# Patient Record
Sex: Female | Born: 1975
Health system: Southern US, Community
[De-identification: ages and names within clinical notes are randomized; demographics above are authoritative.]

## PROBLEM LIST (undated history)

## (undated) DIAGNOSIS — I509 Heart failure, unspecified: Secondary | ICD-10-CM

## (undated) DIAGNOSIS — I10 Essential (primary) hypertension: Secondary | ICD-10-CM

## (undated) DIAGNOSIS — IMO0001 Reserved for inherently not codable concepts without codable children: Secondary | ICD-10-CM

## (undated) HISTORY — DX: Reserved for inherently not codable concepts without codable children: IMO0001

## (undated) HISTORY — DX: Essential (primary) hypertension: I10

---

## 1998-05-29 ENCOUNTER — Emergency Department (HOSPITAL_COMMUNITY): Admission: EM | Admit: 1998-05-29 | Discharge: 1998-05-29 | Payer: Self-pay | Admitting: Emergency Medicine

## 1999-02-08 ENCOUNTER — Other Ambulatory Visit: Admission: RE | Admit: 1999-02-08 | Discharge: 1999-02-08 | Payer: Self-pay | Admitting: Gynecology

## 2004-07-31 ENCOUNTER — Inpatient Hospital Stay (HOSPITAL_COMMUNITY): Admission: AD | Admit: 2004-07-31 | Discharge: 2004-07-31 | Payer: Self-pay | Admitting: Obstetrics & Gynecology

## 2004-10-03 ENCOUNTER — Other Ambulatory Visit: Admission: RE | Admit: 2004-10-03 | Discharge: 2004-10-03 | Payer: Self-pay | Admitting: Gynecology

## 2004-10-26 ENCOUNTER — Encounter: Admission: RE | Admit: 2004-10-26 | Discharge: 2005-01-24 | Payer: Self-pay | Admitting: Gynecology

## 2005-02-25 ENCOUNTER — Inpatient Hospital Stay (HOSPITAL_COMMUNITY): Admission: AD | Admit: 2005-02-25 | Discharge: 2005-02-25 | Payer: Self-pay | Admitting: Gynecology

## 2005-03-07 ENCOUNTER — Inpatient Hospital Stay (HOSPITAL_COMMUNITY): Admission: RE | Admit: 2005-03-07 | Discharge: 2005-03-10 | Payer: Self-pay | Admitting: Gynecology

## 2005-03-07 ENCOUNTER — Encounter (INDEPENDENT_AMBULATORY_CARE_PROVIDER_SITE_OTHER): Payer: Self-pay | Admitting: *Deleted

## 2005-04-18 ENCOUNTER — Other Ambulatory Visit: Admission: RE | Admit: 2005-04-18 | Discharge: 2005-04-18 | Payer: Self-pay | Admitting: Gynecology

## 2005-08-23 ENCOUNTER — Ambulatory Visit: Payer: Self-pay | Admitting: Internal Medicine

## 2005-09-25 ENCOUNTER — Ambulatory Visit: Payer: Self-pay | Admitting: Internal Medicine

## 2005-09-28 ENCOUNTER — Ambulatory Visit: Payer: Self-pay | Admitting: Internal Medicine

## 2005-10-04 ENCOUNTER — Ambulatory Visit: Payer: Self-pay | Admitting: Internal Medicine

## 2007-11-13 ENCOUNTER — Observation Stay (HOSPITAL_COMMUNITY): Admission: EM | Admit: 2007-11-13 | Discharge: 2007-11-15 | Payer: Self-pay | Admitting: Emergency Medicine

## 2007-11-13 ENCOUNTER — Ambulatory Visit: Payer: Self-pay | Admitting: Internal Medicine

## 2007-11-14 ENCOUNTER — Encounter (INDEPENDENT_AMBULATORY_CARE_PROVIDER_SITE_OTHER): Payer: Self-pay | Admitting: Internal Medicine

## 2008-09-14 ENCOUNTER — Inpatient Hospital Stay (HOSPITAL_COMMUNITY): Admission: EM | Admit: 2008-09-14 | Discharge: 2008-09-16 | Payer: Self-pay | Admitting: Emergency Medicine

## 2008-09-14 ENCOUNTER — Ambulatory Visit: Payer: Self-pay | Admitting: Cardiovascular Disease

## 2008-09-15 ENCOUNTER — Encounter (INDEPENDENT_AMBULATORY_CARE_PROVIDER_SITE_OTHER): Payer: Self-pay | Admitting: Internal Medicine

## 2009-02-14 ENCOUNTER — Emergency Department (HOSPITAL_COMMUNITY): Admission: EM | Admit: 2009-02-14 | Discharge: 2009-02-14 | Payer: Self-pay | Admitting: Emergency Medicine

## 2009-04-26 ENCOUNTER — Inpatient Hospital Stay (HOSPITAL_COMMUNITY): Admission: EM | Admit: 2009-04-26 | Discharge: 2009-04-27 | Payer: Self-pay | Admitting: Emergency Medicine

## 2009-04-26 ENCOUNTER — Ambulatory Visit: Payer: Self-pay | Admitting: Internal Medicine

## 2009-04-27 ENCOUNTER — Encounter (INDEPENDENT_AMBULATORY_CARE_PROVIDER_SITE_OTHER): Payer: Self-pay | Admitting: Internal Medicine

## 2009-06-01 ENCOUNTER — Ambulatory Visit: Payer: Self-pay | Admitting: Internal Medicine

## 2009-06-01 DIAGNOSIS — I10 Essential (primary) hypertension: Secondary | ICD-10-CM | POA: Insufficient documentation

## 2009-08-01 ENCOUNTER — Telehealth: Payer: Self-pay | Admitting: Internal Medicine

## 2009-09-22 ENCOUNTER — Ambulatory Visit: Payer: Self-pay | Admitting: Internal Medicine

## 2009-12-26 ENCOUNTER — Telehealth: Payer: Self-pay | Admitting: Internal Medicine

## 2010-09-21 ENCOUNTER — Ambulatory Visit: Payer: Self-pay | Admitting: Gynecology

## 2010-09-21 NOTE — Assessment & Plan Note (Signed)
Summary: F/U FOR BLOOD PRESSURE/CD   Vital Signs:  Patient profile:   35 year old female Menstrual status:  regular Height:      65 inches Weight:      233 pounds O2 Sat:      98 % on Room air Temp:     97.7 degrees F oral Pulse rate:   95 / minute Pulse rhythm:   regular Resp:     16 per minute BP sitting:   154 / 110  (left arm) Cuff size:   large  Vitals Entered By: Rock Nephew CMA (September 22, 2009 2:21 PM)  O2 Flow:  Room air  Primary Care Provider:  Yetta Barre   History of Present Illness: She returns for f/up and says that she was doing well until 2 weeks ago when she ran out of Tribenzor and since then her BP has risen and she has had mild headaches.  Hypertension History:      She complains of headache, but denies chest pain, palpitations, dyspnea with exertion, orthopnea, peripheral edema, visual symptoms, neurologic problems, syncope, and side effects from treatment.  She notes no problems with any antihypertensive medication side effects.        Positive major cardiovascular risk factors include hypertension.  Negative major cardiovascular risk factors include female age less than 27 years old, no history of diabetes or hyperlipidemia, negative family history for ischemic heart disease, and non-tobacco-user status.        Positive history for target organ damage include cardiac end organ damage (either CHF or LVH) and hypertensive retinopathy.  Further assessment for target organ damage reveals no history of ASHD, stroke/TIA, peripheral vascular disease, or renal insufficiency.     Preventive Screening-Counseling & Management  Alcohol-Tobacco     Alcohol drinks/day: <1     Alcohol type: beer     >5/day in last 3 mos: no     Alcohol Counseling: not indicated; use of alcohol is not excessive or problematic     Feels need to cut down: no     Feels annoyed by complaints: no     Feels guilty re: drinking: no     Needs 'eye opener' in am: no     Smoking Status:  never  Hep-HIV-STD-Contraception     Hepatitis Risk: no risk noted     HIV Risk: no risk noted     STD Risk: no risk noted      Sexual History:  currently monogamous.        Drug Use:  no.        Blood Transfusions:  no.    Medications Prior to Update: 1)  Tribenzor 20-5-12.5 Mg Tabs (Olmesartan-Amlodipine-Hctz) .... Once Daily For High Blood Pressure 2)  Mirena 20 Mcg/24hr Iud (Levonorgestrel)  Current Medications (verified): 1)  Tribenzor 20-5-12.5 Mg Tabs (Olmesartan-Amlodipine-Hctz) .... Once Daily For High Blood Pressure 2)  Mirena 20 Mcg/24hr Iud (Levonorgestrel)  Allergies (verified): No Known Drug Allergies  Past History:  Past Medical History: Reviewed history from 06/01/2009 and no changes required. Hypertension Gestational diabetes  Past Surgical History: Reviewed history from 06/01/2009 and no changes required. Caesarean section  Family History: Reviewed history from 06/01/2009 and no changes required. Family History Diabetes 1st degree relative Family History High cholesterol Family History Hypertension Family History of Stroke F 1st degree relative <60  Social History: Reviewed history from 06/01/2009 and no changes required. 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    Physical Exam  General:  alert, well-developed, well-nourished, well-hydrated, appropriate dress, normal appearance, healthy-appearing, cooperative to examination, and overweight-appearing.   Eyes:  vision grossly intact, pupils equal, pupils round, pupils reactive to light, and a-v nicking.   Mouth:  Oral mucosa and oropharynx without lesions or exudates.  Teeth in good repair. Neck:  supple, full ROM, no masses, no thyromegaly, no cervical lymphadenopathy, and no neck tenderness.   Lungs:  Normal  respiratory effort, chest expands symmetrically. Lungs are clear to auscultation, no crackles or wheezes. Heart:  Normal rate and regular rhythm. S1 and S2 normal without gallop, murmur, click, rub or other extra sounds. Abdomen:  soft, non-tender, normal bowel sounds, no distention, no masses, no guarding, no rigidity, no rebound tenderness, no hepatomegaly, and no splenomegaly.   Msk:  normal ROM, no joint tenderness, no joint swelling, no joint warmth, and no redness over joints.   Pulses:  R and L carotid,radial,femoral,dorsalis pedis and posterior tibial pulses are full and equal bilaterally Extremities:  No clubbing, cyanosis, edema, or deformity noted with normal full range of motion of all joints.   Neurologic:  No cranial nerve deficits noted. Station and gait are normal. Plantar reflexes are down-going bilaterally. DTRs are symmetrical throughout. Sensory, motor and coordinative functions appear intact. Skin:  Intact without suspicious lesions or rashes Cervical Nodes:  No lymphadenopathy noted Psych:  Cognition and judgment appear intact. Alert and cooperative with normal attention span and concentration. No apparent delusions, illusions, hallucinations   Impression & Recommendations:  Problem # 1:  HYPERTENSION (ICD-401.9) Assessment Deteriorated  Her updated medication list for this problem includes:    Tribenzor 20-5-12.5 Mg Tabs (Olmesartan-amlodipine-hctz) ..... Once daily for high blood pressure  BP today: 154/110 Prior BP: 162/100 (06/01/2009)  Prior 10 Yr Risk Heart Disease: Not enough information (06/01/2009)  Complete Medication List: 1)  Tribenzor 20-5-12.5 Mg Tabs (Olmesartan-amlodipine-hctz) .... Once daily for high blood pressure 2)  Mirena 20 Mcg/24hr Iud (Levonorgestrel)  Hypertension Assessment/Plan:      The patient's hypertensive risk group is category C: Target organ damage and/or diabetes.  Today's blood pressure is 154/110.  Her blood pressure goal is <  140/90.  Patient Instructions: 1)  Please schedule a follow-up appointment in 6 months. 2)  It is important that you exercise regularly at least 20 minutes 5 times a week. If you develop chest pain, have severe difficulty breathing, or feel very tired , stop exercising immediately and seek medical attention. 3)  You need to lose weight. Consider a lower calorie diet and regular exercise.  4)  Check your Blood Pressure regularly. If it is above 140/90: you should make an appointment. Prescriptions: TRIBENZOR 20-5-12.5 MG TABS (OLMESARTAN-AMLODIPINE-HCTZ) once daily for high blood pressure  #30 x 11   Entered and Authorized by:   Etta Grandchild MD   Signed by:   Etta Grandchild MD on 09/22/2009   Method used:   Print then Give to Patient   RxID:   [id number] TRIBENZOR 20-5-12.5 MG TABS (OLMESARTAN-AMLODIPINE-HCTZ) once daily for high blood pressure  #56 x 0   Entered and Authorized by:   Etta Grandchild MD   Signed by:   Etta Grandchild MD on 09/22/2009   Method used:   Samples Given   RxID:   [id number]

## 2010-09-21 NOTE — Progress Notes (Signed)
Summary: side effects  Phone Note Call from Patient Call back at Home Phone [phone]   Summary of Call: Patient left message on triage that prescription for Tribenzor on 4/22. Patient c/o weakness, left arm pain, and rattle when breating. Is this side effects from the med? Any recommendations or should she come in? Please advise. Initial call taken by: Lucious Groves,  Dec 26, 2009 1:17 PM  Follow-up for Phone Call        no, it doesn't sound like a side effect Follow-up by: Etta Grandchild MD,  Dec 26, 2009 1:27 PM  Additional Follow-up for Phone Call Additional follow up Details #1::        notified pt with md response. Pt states she feel alot better since she started moving around. If sxs start back will make f/u appt for eval Additional Follow-up by: Orlan Leavens,  Dec 26, 2009 2:19 PM

## 2010-10-03 ENCOUNTER — Other Ambulatory Visit: Payer: Self-pay | Admitting: Gynecology

## 2010-10-03 ENCOUNTER — Other Ambulatory Visit: Payer: Self-pay | Admitting: Internal Medicine

## 2010-10-03 ENCOUNTER — Encounter: Payer: Self-pay | Admitting: Internal Medicine

## 2010-10-03 ENCOUNTER — Ambulatory Visit (INDEPENDENT_AMBULATORY_CARE_PROVIDER_SITE_OTHER): Payer: 59 | Admitting: Gynecology

## 2010-10-03 ENCOUNTER — Ambulatory Visit (INDEPENDENT_AMBULATORY_CARE_PROVIDER_SITE_OTHER): Payer: 59 | Admitting: Internal Medicine

## 2010-10-03 ENCOUNTER — Other Ambulatory Visit: Payer: 59

## 2010-10-03 ENCOUNTER — Other Ambulatory Visit (HOSPITAL_COMMUNITY)
Admission: RE | Admit: 2010-10-03 | Discharge: 2010-10-03 | Disposition: A | Discharge status: Home / Self Care | Payer: 59 | Source: Ambulatory Visit | Attending: Gynecology | Admitting: Gynecology

## 2010-10-03 DIAGNOSIS — R82998 Other abnormal findings in urine: Secondary | ICD-10-CM

## 2010-10-03 DIAGNOSIS — Z01419 Encounter for gynecological examination (general) (routine) without abnormal findings: Secondary | ICD-10-CM

## 2010-10-03 DIAGNOSIS — I10 Essential (primary) hypertension: Secondary | ICD-10-CM

## 2010-10-03 DIAGNOSIS — Z124 Encounter for screening for malignant neoplasm of cervix: Secondary | ICD-10-CM | POA: Insufficient documentation

## 2010-10-03 LAB — BASIC METABOLIC PANEL
BUN: 13 mg/dL (ref 6–23)
CO2: 29 mEq/L (ref 19–32)
Calcium: 9.2 mg/dL (ref 8.4–10.5)
Chloride: 102 mEq/L (ref 96–112)
Creatinine, Ser: 0.9 mg/dL (ref 0.4–1.2)
Glucose, Bld: 91 mg/dL (ref 70–99)
Potassium: 3.8 mEq/L (ref 3.5–5.1)
Sodium: 136 mEq/L (ref 135–145)

## 2010-10-11 NOTE — Assessment & Plan Note (Signed)
Summary: FU ON BP AND MEDS/NWS  #   Vital Signs:  Patient profile:   35 year old female Menstrual status:  regular LMP:     09/20/2010 Height:      65 inches Weight:      230 pounds BMI:     38.41 O2 Sat:      97 % on Room air Temp:     98.3 degrees F oral Pulse rate:   90 / minute Pulse rhythm:   regular Resp:     16 per minute BP sitting:   140 / 86  (left arm) Cuff size:   large  Vitals Entered By: Rock Nephew CMA (October 03, 2010 8:57 AM)  Nutrition Counseling: Patient's BMI is greater than 25 and therefore counseled on weight management options.  O2 Flow:  Room air CC: follow-up visit Is Patient Diabetic? No Pain Assessment Patient in pain? no      LMP (date): 09/20/2010     Enter LMP: 09/20/2010   Primary Care Provider:  Yetta Barre  CC:  follow-up visit.  History of Present Illness:  Hypertension Follow-Up      This is a 35 year old woman who presents for Hypertension follow-up.  The patient denies lightheadedness, urinary frequency, headaches, edema, rash, and fatigue.  The patient denies the following associated symptoms: chest pain, chest pressure, exercise intolerance, dyspnea, palpitations, syncope, leg edema, and pedal edema.  Compliance with medications (by patient report) has been near 100%.  The patient reports that dietary compliance has been fair.  The patient reports no exercise.  Adjunctive measures currently used by the patient include salt restriction and relaxation.    Preventive Screening-Counseling & Management  Alcohol-Tobacco     Alcohol drinks/day: <1     Alcohol type: beer     >5/day in last 3 mos: no     Alcohol Counseling: not indicated; use of alcohol is not excessive or problematic     Feels need to cut down: no     Feels annoyed by complaints: no     Feels guilty re: drinking: no     Needs 'eye opener' in am: no     Smoking Status: never     Tobacco Counseling: not indicated; no tobacco use  Hep-HIV-STD-Contraception  Hepatitis Risk: no risk noted     HIV Risk: no risk noted     STD Risk: no risk noted      Sexual History:  currently monogamous.        Drug Use:  no.        Blood Transfusions:  no.    Medications Prior to Update: 1)  Tribenzor 20-5-12.5 Mg Tabs (Olmesartan-Amlodipine-Hctz) .... Once Daily For High Blood Pressure 2)  Mirena 20 Mcg/24hr Iud (Levonorgestrel)  Current Medications (verified): 1)  Tribenzor 20-5-12.5 Mg Tabs (Olmesartan-Amlodipine-Hctz) .... 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 Family History Diabetes 1st degree relative Family History High cholesterol Family History Hypertension Family History of Stroke F 1st degree relative <60  Social History: Reviewed history from 06/01/2009 and no changes required. 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  The patient denies anorexia, fever, weight loss, chest pain, syncope, dyspnea on exertion, peripheral edema, prolonged cough,  headaches, hemoptysis, abdominal pain, hematuria, suspicious skin lesions, difficulty walking, depression, unusual weight change, abnormal bleeding, and enlarged lymph nodes.    Physical Exam  General:  alert, well-developed, well-nourished, well-hydrated, appropriate dress, normal appearance, healthy-appearing, cooperative to examination, and overweight-appearing.   Head:  normocephalic and atraumatic.   Mouth:  Oral mucosa and oropharynx without lesions or exudates.  Teeth in good repair. Neck:  supple, full ROM, no masses, no thyromegaly, no cervical lymphadenopathy, and no neck tenderness.   Lungs:  Normal respiratory effort, chest expands symmetrically. Lungs are clear to auscultation, no crackles or wheezes. Heart:  Normal rate and regular rhythm. S1 and S2 normal without gallop, murmur, click, rub or other extra sounds. Abdomen:  soft, non-tender, normal bowel sounds, no distention, no masses, no guarding, no rigidity, no rebound tenderness, no hepatomegaly, and no splenomegaly.   Msk:  normal ROM, no joint tenderness, no joint swelling, no joint warmth, and no redness over joints.   Pulses:  R and L carotid,radial,femoral,dorsalis pedis and posterior tibial pulses are full and equal bilaterally Extremities:  No clubbing, cyanosis, edema, or deformity noted with normal full range of motion of all joints.   Neurologic:  No cranial nerve deficits noted. Station and gait are normal. Plantar reflexes are down-going bilaterally. DTRs are symmetrical throughout. Sensory, motor and coordinative functions appear intact. Skin:  Intact without suspicious lesions or rashes Cervical Nodes:  No lymphadenopathy noted Psych:  Cognition and judgment appear intact. Alert and cooperative with normal attention span and concentration. No apparent delusions, illusions, hallucinations   Impression & Recommendations:  Problem # 1:  HYPERTENSION (ICD-401.9) Assessment Improved  Her updated medication list  for this problem includes:    Tribenzor 20-5-12.5 Mg Tabs (Olmesartan-amlodipine-hctz) ..... Once daily for high blood pressure  Orders: Venipuncture (16109) TLB-BMP (Basic Metabolic Panel-BMET) (80048-METABOL)  BP today: 140/86 Prior BP: 154/110 (09/22/2009)  Prior 10 Yr Risk Heart Disease: Not enough information (06/01/2009)  Complete Medication List: 1)  Tribenzor 20-5-12.5 Mg Tabs (Olmesartan-amlodipine-hctz) .... Once daily for high blood pressure 2)  Mirena 20 Mcg/24hr Iud (Levonorgestrel)  Patient Instructions: 1)  Please schedule a follow-up appointment in 4 months. 2)  It is important that you exercise regularly at least 20 minutes 5 times a week. If you develop chest pain, have severe difficulty breathing, or feel very tired , stop exercising immediately and seek medical attention. 3)  You need to lose weight. Consider a lower calorie diet and regular exercise.  4)  If you could be exposed to sexually transmitted diseases, you should use a condom. 5)  If you are having sex and you or your partner don't want a child, use contraception. 6)  Check your Blood Pressure regularly. If it is above 140/90: you should make an appointment. Prescriptions: TRIBENZOR 20-5-12.5 MG TABS (OLMESARTAN-AMLODIPINE-HCTZ) once daily for high blood pressure  #30 x 11   Entered and Authorized by:   Etta Grandchild MD   Signed by:  Etta Grandchild MD on 10/03/2010   Method used:   Electronically to        [facility] (retail)       [address]       [place], [place]  [address]       Ph: [phone]       Fax: [phone]   RxID:   [id number]    Orders Added: 1)  Venipuncture [40102] 2)  TLB-BMP (Basic Metabolic Panel-BMET) [80048-METABOL] 3)  Est. Patient Level III [72536]

## 2010-11-24 LAB — POCT I-STAT, CHEM 8
BUN: 12 mg/dL (ref 6–23)
BUN: 22 mg/dL (ref 6–23)
Calcium, Ion: 1.04 mmol/L — ABNORMAL LOW (ref 1.12–1.32)
Calcium, Ion: 1.12 mmol/L (ref 1.12–1.32)
Chloride: 102 mEq/L (ref 96–112)
Chloride: 103 mEq/L (ref 96–112)
Creatinine, Ser: 1 mg/dL (ref 0.4–1.2)
Creatinine, Ser: 1.2 mg/dL (ref 0.4–1.2)
Glucose, Bld: 115 mg/dL — ABNORMAL HIGH (ref 70–99)
Glucose, Bld: 129 mg/dL — ABNORMAL HIGH (ref 70–99)
HCT: 42 % (ref 36.0–46.0)
HCT: 43 % (ref 36.0–46.0)
Hemoglobin: 14.3 g/dL (ref 12.0–15.0)
Hemoglobin: 14.6 g/dL (ref 12.0–15.0)
Potassium: 3.7 mEq/L (ref 3.5–5.1)
Potassium: 5.7 mEq/L — ABNORMAL HIGH (ref 3.5–5.1)
Sodium: 135 mEq/L (ref 135–145)
Sodium: 136 mEq/L (ref 135–145)
TCO2: 22 mmol/L (ref 0–100)
TCO2: 26 mmol/L (ref 0–100)

## 2010-11-24 LAB — BASIC METABOLIC PANEL
BUN: 10 mg/dL (ref 6–23)
BUN: 12 mg/dL (ref 6–23)
CO2: 23 mEq/L (ref 19–32)
Calcium: 8.8 mg/dL (ref 8.4–10.5)
Chloride: 106 mEq/L (ref 96–112)
Creatinine, Ser: 0.86 mg/dL (ref 0.4–1.2)
Creatinine, Ser: 1.11 mg/dL (ref 0.4–1.2)
GFR calc Af Amer: >60 mL/min (ref >60)
GFR calc non Af Amer: 56 mL/min — ABNORMAL LOW (ref >60)
GFR calc non Af Amer: >60 mL/min (ref >60)
Glucose, Bld: 111 mg/dL — ABNORMAL HIGH (ref 70–99)
Glucose, Bld: 124 mg/dL — ABNORMAL HIGH (ref 70–99)
Potassium: 3.5 mEq/L (ref 3.5–5.1)
Potassium: 3.7 mEq/L (ref 3.5–5.1)
Sodium: 136 mEq/L (ref 135–145)

## 2010-11-24 LAB — CBC
HCT: 41.1 % (ref 36.0–46.0)
Hemoglobin: 13.6 g/dL (ref 12.0–15.0)
Hemoglobin: 13.9 g/dL (ref 12.0–15.0)
MCHC: 33.1 g/dL (ref 30.0–36.0)
MCHC: 33.3 g/dL (ref 30.0–36.0)
MCV: 78.3 fL (ref 78.0–100.0)
Platelets: 219 10*3/uL (ref 150–400)
RBC: 5.24 MIL/uL — ABNORMAL HIGH (ref 3.87–5.11)
RBC: 5.34 MIL/uL — ABNORMAL HIGH (ref 3.87–5.11)
RDW: 16 % — ABNORMAL HIGH (ref 11.5–15.5)
WBC: 8.8 10*3/uL (ref 4.0–10.5)
WBC: 9.9 10*3/uL (ref 4.0–10.5)

## 2010-11-24 LAB — LIPID PANEL
HDL: 53 mg/dL (ref >39)
LDL Cholesterol: 129 mg/dL — ABNORMAL HIGH (ref 0–99)
Total CHOL/HDL Ratio: 3.7 RATIO
Triglycerides: 66 mg/dL (ref <150)
VLDL: 13 mg/dL (ref 0–40)

## 2010-11-24 LAB — CARDIAC PANEL(CRET KIN+CKTOT+MB+TROPI)
CK, MB: 7 ng/mL — ABNORMAL HIGH (ref 0.3–4.0)
Relative Index: 2.3 (ref 0.0–2.5)
Total CK: 300 U/L — ABNORMAL HIGH (ref 7–177)
Troponin I: 0.04 ng/mL (ref 0.00–0.06)

## 2010-11-24 LAB — HEPATIC FUNCTION PANEL
ALT: 30 U/L (ref 0–35)
AST: 38 U/L — ABNORMAL HIGH (ref 0–37)
Albumin: 3.4 g/dL — ABNORMAL LOW (ref 3.5–5.2)
Alkaline Phosphatase: 75 U/L (ref 39–117)
Bilirubin, Direct: 0.2 mg/dL (ref 0.0–0.3)
Indirect Bilirubin: 0.6 mg/dL (ref 0.3–0.9)
Total Bilirubin: 0.8 mg/dL (ref 0.3–1.2)
Total Protein: 7 g/dL (ref 6.0–8.3)

## 2010-11-24 LAB — DIFFERENTIAL
Basophils Absolute: 0 10*3/uL (ref 0.0–0.1)
Basophils Relative: 0 % (ref 0–1)
Eosinophils Absolute: 0.9 10*3/uL — ABNORMAL HIGH (ref 0.0–0.7)
Eosinophils Relative: 9 % — ABNORMAL HIGH (ref 0–5)
Lymphocytes Relative: 31 % (ref 12–46)
Lymphs Abs: 3.1 10*3/uL (ref 0.7–4.0)
Monocytes Absolute: 0.4 10*3/uL (ref 0.1–1.0)
Monocytes Relative: 4 % (ref 3–12)
Neutro Abs: 5.4 10*3/uL (ref 1.7–7.7)
Neutrophils Relative %: 55 % (ref 43–77)

## 2010-11-24 LAB — CK TOTAL AND CKMB (NOT AT ARMC)
CK, MB: 9.6 ng/mL — ABNORMAL HIGH (ref 0.3–4.0)
Relative Index: 2.2 (ref 0.0–2.5)
Total CK: 443 U/L — ABNORMAL HIGH (ref 7–177)

## 2010-11-24 LAB — URINE DRUGS OF ABUSE SCREEN W ALC, ROUTINE (REF LAB)
Amphetamine Screen, Ur: NEGATIVE
Barbiturate Quant, Ur: NEGATIVE
Benzodiazepines.: NEGATIVE
Cocaine Metabolites: NEGATIVE
Creatinine,U: 51.9 mg/dL
Ethyl Alcohol: 32 mg/dL — ABNORMAL HIGH (ref <10)
Marijuana Metabolite: NEGATIVE
Methadone: NEGATIVE
Opiate Screen, Urine: NEGATIVE
Phencyclidine (PCP): NEGATIVE
Propoxyphene: NEGATIVE

## 2010-11-24 LAB — BRAIN NATRIURETIC PEPTIDE: Pro B Natriuretic peptide (BNP): 223 pg/mL — ABNORMAL HIGH (ref 0.0–100.0)

## 2010-11-24 LAB — POCT CARDIAC MARKERS
CKMB, poc: 8.1 ng/mL (ref 1.0–8.0)
Myoglobin, poc: 115 ng/mL (ref 12–200)
Troponin i, poc: <0.05 ng/mL (ref 0.00–0.09)

## 2010-11-24 LAB — POTASSIUM: Potassium: 3.7 mEq/L (ref 3.5–5.1)

## 2010-11-24 LAB — T4, FREE: Free T4: 0.93 ng/dL (ref 0.80–1.80)

## 2010-11-24 LAB — D-DIMER, QUANTITATIVE: D-Dimer, Quant: 0.75 ug/mL-FEU — ABNORMAL HIGH (ref 0.00–0.48)

## 2010-11-24 LAB — ETHANOL CONFIRM, URINE

## 2010-11-24 LAB — TROPONIN I: Troponin I: 0.02 ng/mL (ref 0.00–0.06)

## 2010-11-27 LAB — DIFFERENTIAL
Basophils Absolute: 0.1 10*3/uL (ref 0.0–0.1)
Basophils Relative: 1 % (ref 0–1)
Eosinophils Absolute: 0.4 10*3/uL (ref 0.0–0.7)
Eosinophils Relative: 5 % (ref 0–5)
Lymphocytes Relative: 36 % (ref 12–46)
Lymphs Abs: 3.1 10*3/uL (ref 0.7–4.0)
Monocytes Absolute: 0.7 10*3/uL (ref 0.1–1.0)
Monocytes Relative: 8 % (ref 3–12)
Neutro Abs: 4.5 10*3/uL (ref 1.7–7.7)
Neutrophils Relative %: 51 % (ref 43–77)

## 2010-11-27 LAB — URINE MICROSCOPIC-ADD ON

## 2010-11-27 LAB — CBC
HCT: 41.7 % (ref 36.0–46.0)
Hemoglobin: 14.1 g/dL (ref 12.0–15.0)
MCHC: 33.8 g/dL (ref 30.0–36.0)
MCV: 77.3 fL — ABNORMAL LOW (ref 78.0–100.0)
Platelets: 189 10*3/uL (ref 150–400)
RBC: 5.4 MIL/uL — ABNORMAL HIGH (ref 3.87–5.11)
RDW: 16.2 % — ABNORMAL HIGH (ref 11.5–15.5)
WBC: 8.8 10*3/uL (ref 4.0–10.5)

## 2010-11-27 LAB — BASIC METABOLIC PANEL
BUN: 13 mg/dL (ref 6–23)
CO2: 23 mEq/L (ref 19–32)
Calcium: 9.2 mg/dL (ref 8.4–10.5)
Chloride: 107 mEq/L (ref 96–112)
Creatinine, Ser: 0.96 mg/dL (ref 0.4–1.2)
GFR calc Af Amer: >60 mL/min (ref >60)
GFR calc non Af Amer: >60 mL/min (ref >60)
Glucose, Bld: 139 mg/dL — ABNORMAL HIGH (ref 70–99)
Potassium: 4 mEq/L (ref 3.5–5.1)
Sodium: 140 mEq/L (ref 135–145)

## 2010-11-27 LAB — URINALYSIS, ROUTINE W REFLEX MICROSCOPIC
Bilirubin Urine: NEGATIVE
Hgb urine dipstick: NEGATIVE
Ketones, ur: NEGATIVE mg/dL
Nitrite: NEGATIVE
Protein, ur: 30 mg/dL — AB
Specific Gravity, Urine: 1.028 (ref 1.005–1.030)
Urobilinogen, UA: 0.2 mg/dL (ref 0.0–1.0)

## 2010-12-04 LAB — TROPONIN I: Troponin I: 0.02 ng/mL (ref 0.00–0.06)

## 2010-12-04 LAB — CARDIAC PANEL(CRET KIN+CKTOT+MB+TROPI)
CK, MB: 3.1 ng/mL (ref 0.3–4.0)
Relative Index: 1.6 (ref 0.0–2.5)
Total CK: 192 U/L — ABNORMAL HIGH (ref 7–177)
Troponin I: 0.02 ng/mL (ref 0.00–0.06)

## 2010-12-04 LAB — PROTIME-INR: Prothrombin Time: 14.2 seconds (ref 11.6–15.2)

## 2010-12-04 LAB — CK TOTAL AND CKMB (NOT AT ARMC)
CK, MB: 4.8 ng/mL — ABNORMAL HIGH (ref 0.3–4.0)
Relative Index: 1.7 (ref 0.0–2.5)
Total CK: 289 U/L — ABNORMAL HIGH (ref 7–177)

## 2010-12-04 LAB — DIFFERENTIAL
Eosinophils Absolute: 0.7 10*3/uL (ref 0.0–0.7)
Eosinophils Relative: 7 % — ABNORMAL HIGH (ref 0–5)
Lymphocytes Relative: 42 % (ref 12–46)
Lymphs Abs: 4.5 10*3/uL — ABNORMAL HIGH (ref 0.7–4.0)
Monocytes Absolute: 0.6 10*3/uL (ref 0.1–1.0)

## 2010-12-04 LAB — BASIC METABOLIC PANEL
Calcium: 9.1 mg/dL (ref 8.4–10.5)
Chloride: 106 mEq/L (ref 96–112)
Creatinine, Ser: 1.05 mg/dL (ref 0.4–1.2)
GFR calc Af Amer: >60 mL/min (ref >60)
GFR calc non Af Amer: 60 mL/min — ABNORMAL LOW (ref >60)

## 2010-12-04 LAB — HEMOGLOBIN A1C
Hgb A1c MFr Bld: 6.4 % — ABNORMAL HIGH (ref 4.6–6.1)
Mean Plasma Glucose: 137 mg/dL

## 2010-12-04 LAB — CBC
HCT: 42 % (ref 36.0–46.0)
HCT: 42.3 % (ref 36.0–46.0)
Hemoglobin: 13.4 g/dL (ref 12.0–15.0)
Hemoglobin: 13.9 g/dL (ref 12.0–15.0)
MCHC: 32 g/dL (ref 30.0–36.0)
MCV: 79 fL (ref 78.0–100.0)
MCV: 79.4 fL (ref 78.0–100.0)
Platelets: 243 10*3/uL (ref 150–400)
RDW: 14.8 % (ref 11.5–15.5)
RDW: 15.4 % (ref 11.5–15.5)
WBC: 10.8 10*3/uL — ABNORMAL HIGH (ref 4.0–10.5)

## 2010-12-04 LAB — POCT I-STAT, CHEM 8
BUN: 16 mg/dL (ref 6–23)
Calcium, Ion: 1.13 mmol/L (ref 1.12–1.32)
Creatinine, Ser: 1 mg/dL (ref 0.4–1.2)
Hemoglobin: 15 g/dL (ref 12.0–15.0)
TCO2: 25 mmol/L (ref 0–100)

## 2010-12-04 LAB — D-DIMER, QUANTITATIVE: D-Dimer, Quant: 0.43 ug/mL-FEU (ref 0.00–0.48)

## 2010-12-04 LAB — COMPREHENSIVE METABOLIC PANEL
Alkaline Phosphatase: 61 U/L (ref 39–117)
BUN: 12 mg/dL (ref 6–23)
Glucose, Bld: 116 mg/dL — ABNORMAL HIGH (ref 70–99)
Potassium: 3.7 mEq/L (ref 3.5–5.1)
Total Protein: 7 g/dL (ref 6.0–8.3)

## 2010-12-04 LAB — BRAIN NATRIURETIC PEPTIDE: Pro B Natriuretic peptide (BNP): 213 pg/mL — ABNORMAL HIGH (ref 0.0–100.0)

## 2010-12-04 LAB — RAPID URINE DRUG SCREEN, HOSP PERFORMED
Barbiturates: NOT DETECTED
Cocaine: NOT DETECTED
Opiates: NOT DETECTED

## 2010-12-04 LAB — TSH: TSH: 0.897 u[IU]/mL (ref 0.350–4.500)

## 2010-12-04 LAB — LIPID PANEL
Cholesterol: 218 mg/dL — ABNORMAL HIGH (ref 0–200)
HDL: 49 mg/dL (ref >39)

## 2010-12-04 LAB — POCT PREGNANCY, URINE: Preg Test, Ur: NEGATIVE

## 2010-12-04 LAB — ALDOSTERONE + RENIN ACTIVITY W/ RATIO

## 2011-01-02 NOTE — Discharge Summary (Signed)
[name], [name]               ACCOUNT NO.:  [id number]   MEDICAL RECORD NO.:  [id number]          PATIENT TYPE:  INP   LOCATION:  4729                         FACILITY:  [facility]   PHYSICIAN:  Richarda Overlie, MD       DATE OF BIRTH:  07-20-1976   DATE OF ADMISSION:  09/14/2008  DATE OF DISCHARGE:  09/16/2008                               DISCHARGE SUMMARY   DISCHARGE DIAGNOSES:  1. Hypertensive urgency.  2. Hypokalemia.  3. Likely metabolic syndrome with borderline diabetes.  4. Dyslipidemia.  5. Mild systolic congestive heart failure.   SUBJECTIVE:  This is a 35 year old female with a past medical history  for hypertension, hypertensive urgency in March 2009, gestational  diabetes and noncompliance who presented to the ER with a chief  complaint of shortness of breath.  The patient denied any chest pain or  chest pressure or bilateral swelling in her legs, denied any fever,  chills, rigors or nausea.  The patient has had ongoing shortness of  breath for the last 2 weeks associated with a non-productive cough.  On  presentation to the ER, she found to have sinus tachycardia on her  electrocardiogram, negative cardiac markers, mildly elevated BNP of 158,  D-dimer was within normal limits.  Chest x-ray revealed mild  interstitial pulmonary edema.  The patient was also found to be quite  hypertensive with an initial blood pressure of 204/131.   HOSPITAL COURSE:  1. Malignant hypertension/hypertensive urgency.  The patient was      admitted for controlling her blood pressure and was initiated on      antihypertensive medications with clonidine, IV hydralazine and IV      Lasix and metoprolol p.o.  She was also initiated on lisinopril      which increased her creatinine from 1 to 1.25.  The patient's blood      pressure stabilized with the above regimen.  The patient was also      found to be hypokalemic, with an initial potassium of 3.3.  Causes      of secondary hypertension were  considered and renal artery stenosis      and adrenal adenoma were also considered.  The patient was      evaluated by a CT angio of the abdomen and pelvis, did not show any      evidence of renal artery stenosis.  However, the left adrenal gland      looked slightly enlarged and a small adenoma about 1 cm in size      could not be ruled out at this time.  The patient would probably      benefit from repeat imaging in about a year.  In the meantime, the      patient should continue with all her antihypertensive medications      as outlined below and has been strongly advised to loose weight and      establish a PCP.   1. Interstitial pulmonary edema.  The patient responded well to      diuresis with IV Lasix.  She had a cardiac echo  done that showed an      ejection fraction of 45-50% with mild diffuse left ventricular      hypokinesis.  The patient had a mild decrease in her systolic      function and is being appropriately started on metoprolol, Lasix.      The patient has been chest pain-free, ruled out for acute coronary      syndrome and thyroid function was found to be within normal limits.      At this time, the patient is asymptomatic from a congestive heart      failure standpoint.  She would probably benefit from a repeat      cardiac echo in 1 year.  If she does develop a recurrence of her      pulmonary symptoms, especially if she continues to be compliant      with her medications following discharge, then the patient would      probably need a cardiology consultation at some point.  However, at      this time I feel that all of her symptoms are probably related to      metabolic syndrome, given the fact that she has evidence of      borderline diabetes with a hemoglobin A1c of 6.4, is hypertensive      and also has an elevated LDL cholesterol and a low HDL on her lipid      panel.  The patient is strongly advised to lose weight, stay      compliant with her medications,  establish a primary care Janne Faulk      within the next week and have a repeat fasting glucose and BMET in      5-7 days.   FOLLOW-UP CONCERNS:  The patient to establish a primary care Aiyla Baucom in  5-7 days, follow-up glucose and BMET in 5-7 days, advised about weight  loss, up to 10% of her body weight recommended.  Repeat cardiac echo in  1 year.   DISCHARGE MEDICATIONS:  1. Hydralazine 50 mg p.o. q.8 h.  2. Toprol XL 100 mg p.o. daily.  3. K-Dur 40 mEq p.o. daily.  4. Lasix 40 kg p.o. daily.      Richarda Overlie, MD  Electronically Signed     NA/MEDQ  D:  09/16/2008  T:  09/16/2008  Job:  [id number]

## 2011-01-02 NOTE — Discharge Summary (Signed)
NAMEMILLEY, VINING               ACCOUNT NO.:  [id number]   MEDICAL RECORD NO.:  [id number]          PATIENT TYPE:  INP   LOCATION:  2030                         FACILITY:  [facility]   PHYSICIAN:  Madaline Savage, MD        DATE OF BIRTH:  [birth year]   DATE OF ADMISSION:  11/13/2007  DATE OF DISCHARGE:  11/15/2007                               DISCHARGE SUMMARY   PRIMARY CARE PHYSICIAN:  None.  This patient is unassigned to [place].   DISCHARGE DIAGNOSES:  1. Hypertensive emergency.  2. Chest pain, ruled out acute coronary syndrome.  3. Tachycardia, which is resolved.   DISCHARGE MEDICATIONS:  1. Lopressor 50 mg twice daily.  2. Lisinopril 10 mg once daily.  3. Penicillin as prescribed by her dentist.   HISTORY OF PRESENT ILLNESS:  For full history and physical, see history  and physical dictated by Dr. Jerolyn Center on November 13, 2007.   In short, this is a 35 year old African American female who was seen by  her dentist today and was found to have elevated blood pressure.  In the  emergency room, she had a blood pressure of 225/152, and was admitted  for further evaluation and management.   PROBLEM:  Hypertensive urgency.  Ms. Hajduk came in with blood pressure  of 225/152 and had a heart rate of 116 and she was started on the  Cardizem drip and p.o. metoprolol and lisinopril.  Blood pressure came  down and the Cardizem drip was stopped, and she was monitored on her  p.o. medications.  Her blood pressure at the time of discharge is  140/70, and 2D echocardiogram was done to evaluate to her left  ventricular function.  At this time, her echocardiogram was also still  pending.   She will be now discharged home in stable condition.   FOLLOWUP:  She is asked to follow up with doctor at [facility] in 1-2  weeks.      Madaline Savage, MD  Electronically Signed     PKN/MEDQ  D:  11/15/2007  T:  11/16/2007  Job:  [id number]

## 2011-01-02 NOTE — H&P (Signed)
NAMEDALE, STRAUSSER NO.:  [id number]   MEDICAL RECORD NO.:  [id number]          PATIENT TYPE:  EMS   LOCATION:  MAJO                         FACILITY:  [facility]   PHYSICIAN:  Altha Harm, MDDATE OF BIRTH:  [birth year]   DATE OF ADMISSION:  11/13/2007  DATE OF DISCHARGE:                              HISTORY & PHYSICAL   CHIEF COMPLAINT:  Elevated blood pressure.   HISTORY OF PRESENT ILLNESS:  This is a 35 year old African American  female who was seen in her dentist's office today and was found to have  an elevated blood pressure.  The patient states that she has a history  of hypertension during pregnancy approximately 2.5 years ago, however,  has not had a subsequent diagnosis of hypertension.  The patient also  relates chest pain which started approximately 24 hours ago which is  substernal, non-radiating, lasting approximately 1 hour, accompanied by  some shortness of breath.  She had no diaphoresis.  The patient denies  any orthopnea, PND, or pedal edema.  She states that the pain at its  highest was about a 6 out of 10, however, the pain is subsided at this  time.  The patient has had no fever, chills, no nausea, vomiting or  diarrhea.  She has no history of elevated lipids.  She has no history of  hypertension except for gestational hypertension and the patient does  have a history of gestational diabetes.   PAST MEDICAL HISTORY:  As noted above is significant for:  1. Gestational diabetes.  2. Pre-eclampsia with hypertension.  As far as she knows there was no proteinuria.   FAMILY HISTORY:  Significant for diabetes in her mother and coronary  artery disease in her uncle.   SOCIAL HISTORY:  The patient works as a Transport planner at [employer].  She has 3 children and is married and lives with her  husband.  She has no tobacco or drug use.  She drinks alcohol  approximately 1-2 times per week socially.   CURRENT MEDICATIONS:   The patient was prescribed penicillin today by her  dentist to be taken for 7 days.   ALLERGIES:  No known drug allergies.   PRIMARY CARE PHYSICIAN:  The patient has no primary care physician.   REVIEW OF SYSTEMS:  Fourteen systems reviewed, all systems are negative  except as noted in the HPI.   STUDIES DONE IN THE EMERGENCY ROOM:  The patient had a 12-lead EKG which  shows a sinus tachycardia with a heart rate of 109.  There is evidence  of left atrial enlargement with a biphasic P wave in V1 and elevated  volume of P waves in lead II.  She also appears to have possible partial  right bundle branch block.  There are no other EKGs to compare this to.   LABORATORY STUDIES:  As follows:  White blood cell count 8.9, hemoglobin  14.5, hematocrit 42.7, platelet count 265.  Sodium 138, potassium 4.4,  chloride 108, BUN 10, creatinine 0.9.  Point-of-care enzymes show a  myoglobin of 61.9, CK-MB of 3.2,  and a troponin of 0.05.   PHYSICAL EXAMINATION:  GENERAL:  The patient is sitting in bed in no  acute distress, nontoxic-appearing.  VITAL SIGNS:  As follows, blood pressure currently 197/110, heart rate  116, respiratory rate 12, O2 sat is 98% on room air.  Please note that  blood pressure on presentation was 225/152.  HEENT:  Normocephalic atraumatic.  Pupils are equal, round, and reactive  to light and accommodation.  Extraocular movements are intact.  Fundi  are benign.  No conjunctival pallor.  No icterus noted.  Oropharynx is  moist without exudate, erythema, or lesions noted.  Tympanic membranes  are translucent bilaterally with good landmarks.  No evidence of facial  asymmetry noted.  NECK:  Trachea is midline.  No masses, no thyromegaly, no JVD, no  carotid bruit.  RESPIRATORY:  The patient has normal respiratory effort.  Equal  excursion bilaterally.  No wheezes or rhonchi noted.  CARDIOVASCULAR:  She has got a normal S1 and S2.  She has got no  murmurs, rubs, or gallops.  PMI  is increased in intensity, however, it  is nondisplaced.  There are no heaves or thrills on palpation.  ABDOMEN:  The patient has a markedly obese abdomen, soft, nontender,  nondistended.  No masses, no hepatosplenomegaly noted.  LYMPH NODE SURVEY:  She has got no cervical, axillary, or inguinal  lymphadenopathy noted.  MUSCULOSKELETAL:  She has got no warmth, swelling, or erythema around  the joints.  No spinal tenderness noted.  NEUROLOGIC:  No focal neurological deficits noted.  Cranial nerves II-  XII are grossly intact.  DTRs are 2 plus bilaterally upper and lower  extremities.  Sensation is intact to light touch and proprioception.  PSYCHIATRIC:  She is alert and oriented x3.  Good cognition and insight.  Good recent to remote recall.   ASSESSMENT:  This is a patient who presents with:  1. Hypertensive urgency.  2. Tachycardia.  3. Chest pain.   PLAN OF CARE:  The patient will be started on a Cardizem drip and should  be started on metoprolol p.o. in addition to an ACE inhibitor.  The  patient will have her risk factors evaluated including a fasting lipid  panel, TSH.  She will also have a hemoglobin A1c to check for any  diabetes and a urinalysis to check for any proteinuria.  Her enzymes  will be cycled and her meds will be titrated to get her blood pressure  down approximately 10-20% in the 160/90 range.  The patient will also  have a 2D echo performed to evaluate left ventricular function and any  evidence of atrial abnormality.  Further treatment will be based upon  her initial response to treatment.      Altha Harm, MD  Electronically Signed     MAM/MEDQ  D:  11/13/2007  T:  11/13/2007  Job:  [id number]

## 2011-01-02 NOTE — H&P (Signed)
[name], [name] NO.:  [id number]   MEDICAL RECORD NO.:  [id number]          PATIENT TYPE:  EMS   LOCATION:  MAJO                         FACILITY:  [facility]   PHYSICIAN:  Elliot Cousin, M.D.    DATE OF BIRTH:  [birth year]   DATE OF ADMISSION:  09/14/2008  DATE OF DISCHARGE:                              HISTORY & PHYSICAL   PRIMARY CARE PHYSICIAN:  The patient is unassigned.   CHIEF COMPLAINT:  I felt like I could not catch my breath.   HISTORY OF PRESENT ILLNESS:  The patient is a 35 year old woman with a  past medical history significant for hypertension and hypertensive  urgency in March of 2009, gestational diabetes, and noncompliance.  She  presents to the emergency department with a chief complaint of shortness  of breath.  The shortness of breath started early this morning at  approximately 4 a.m. while she was asleep.  It woke her up out of her  sleep.  She states that she could not catch her breath.  She has had no  associated chest pain or pressure, swelling in her legs, pleurisy,  fever, chills, nausea or vomiting.  One episode of shortness of breath  occurred for the first time 2 weeks ago, but it went away within an  hour.  She has had  an associated productive cough with white foamy  sputum.  Over the last day or two, her cough has been dry.  Her only  complaint now in the emergency department is a frontal headache which  she was told may be secondary to the nitropaste.  Prior to her arrival,  she had no complaints of headache.  She was given 40 mg of intravenous  Lasix and apparently diuresed well.  She feels better.   During the evaluation in the emergency department, her EKG revealed  sinus tachycardia with a heart rate of 118 beats per minute.  Her  initial cardiac markers are negative.  Her BNP is elevated slightly at  158 (the patient is obese).  Her D-dimer is within normal limits at  0.43.  Her chest x-ray reveals interstitial pulmonary  edema.  The  patient will be admitted for further evaluation and management.   PAST MEDICAL HISTORY:  1. Hypertension with hypertensive urgency in March of 2009.  2. Noncompliance as the patient did not refill her prescription for      antihypertensive medications following the hospitalization in March      of 2009.  3. Echocardiogram in March of 2009 revealing an ejection fraction of      55-60%.  4. History of gestational diabetes.  5. History of preeclampsia  6. History of herpes simplex virus  7. History of anemia.  8. Status post C-section.   MEDICATIONS:  None.   ALLERGIES:  No known drug allergies.   SOCIAL HISTORY:  The patient is single.  She has two children.  She is a  Transport planner at [employer].  She denies tobacco and  illicit drug use.  She does drink alcohol occasionally.   FAMILY HISTORY:  Her  mother died of congestive heart failure at 37 years  of age.  She also had a history of myocardial infarction, type 2  diabetes mellitus, and hypertension.  Her father is alive however, his  whereabouts and health are unknown.   REVIEW OF SYSTEMS:  Otherwise negative.   PHYSICAL EXAMINATION:  Temperature 97, blood pressure 162/114 (it was  204/131 upon arrival at 3 a.m. this morning).  Pulse rate 93,  respiratory rate 18, oxygen saturation 100% on room air.  GENERAL: The  patient is a pleasant 35 year old obese African American woman who is  currently lying in bed in no acute distress.  HEENT:  Head is normocephalic nontraumatic.  Pupils are equal, round,  reactive to light.  Extraocular muscles are intact.  Conjunctivae are  clear.  Sclerae are white.  Nasal mucosa is mildly dry.  No sinus  tenderness.  Oropharynx reveals mildly dry mucous membranes.  No  posterior exudates or erythema.  NECK:  Supple.  No adenopathy, no thyromegaly, no bruit, no JVD.  LUNGS:  Decreased breath sounds in the bases and only a rare crackle  auscultated on the left greater  than the right.  HEART:  S1 and S2 with a soft systolic murmur.  ABDOMEN: Obese positive bowel sounds, soft, nontender, nondistended.  No  hepatosplenomegaly, no masses palpated.  GU/RECTAL:  Deferred.  EXTREMITIES:  Pedal pulses palpable.  No pretibial edema and no pedal  edema.  NEUROLOGIC:  The patient is alert and oriented x3.  Cranial nerves II-  XII are intact.  Strength is 5/5 throughout.  Sensation is intact.   ADMISSION LABORATORIES:  EKG reveals sinus tachycardia with a heart rate  of 118 beats per minute and bilateral atrial abnormalities and  borderline prolonged QT interval (340/476).  Sodium 140, potassium 3.3,  chloride 105, BUN 16, creatinine 1, glucose 127, ionized calcium 1.13,  CO2 of 25.  WBC 10.8, hemoglobin 13.8, platelets 243.  BNP 158.  Urine  pregnancy test negative.  D-dimer 0.43, CK-MB 4.4, troponin-I less than  0.05, myoglobin 85.8.   ASSESSMENT:  1. Interstitial pulmonary edema, possibly secondary to acute      congestive heart failure.  It is unclear whether or not the patient      has diastolic or systolic heart failure.  Her ejection fraction was      55-60% approximately 1 year ago.  The interstitial edema may also      be simply a consequence of heart strain secondary to uncontrolled      hypertension.  Of note, the patient's BNP is 158.  By normal      standards, the BNP is slightly elevated.  However, in an obese      patient as this, a BNP of 158 may be quite elevated.  2. Malignant hypertension.  The patient's blood pressure was 204/131      upon arrival to the emergency department this morning.  She has a      history of hypertensive urgency in March of 2009.  She was treated      effectively during the March hospitalization with lisinopril and      metoprolol.  Since that time, the patient has been noncompliant.  3. Hypokalemia.  The patient's serum potassium is 3.3.   PLAN:  1. The patient was given 40 mg of Lasix IV and nitropaste was added  by      the emergency department physician.  2. We will continue intravenous Lasix therapy and discontinue the  nitropaste.  3. We will start blood pressure management with Lopressor 50 mg b.i.d.      and lisinopril 10 mg daily (the medications that the patient was      discharged home on in March of 2009).  We will also add p.r.n.      hydralazine and clonidine.  4. We will check a urine drug screen.  5. For further evaluation, we will check cardiac enzymes, 2-D      echocardiogram, and TSH.  6. The patient was highly encouraged to become compliant with medical      therapy when discharged.  I informed her that if she continues to      be noncompliant, her uncontrolled hypertension may lead to stroke,      heart attack, or renal failure, or worse, death.      Elliot Cousin, M.D.  Electronically Signed     DF/MEDQ  D:  09/14/2008  T:  09/14/2008  Job:  [id number]

## 2011-01-05 NOTE — H&P (Signed)
NAMESANAIYAH, [name]               ACCOUNT NO.:  [id number]   MEDICAL RECORD NO.:  [id number]          PATIENT TYPE:  INP   LOCATION:  NA                            FACILITY:  [facility]   PHYSICIAN:  Timothy P. Fontaine, M.D.DATE OF BIRTH:  Oct 28, 1975   DATE OF ADMISSION:  03/07/2005  DATE OF DISCHARGE:                                HISTORY & PHYSICAL   CHIEF COMPLAINT:  1.  Pregnancy at 37 weeks.  2.  Oligohydramnios.  3.  Recent HSV outbreak.   HISTORY OF PRESENT ILLNESS:  A 35 year old, G8, P72 female, at [redacted] weeks  gestation, being followed with low AFI.  She has a history of gestational  diabetes, for which she has been doing glucose screening, which have been  reasonable in their values, and antepartum testing to include NSTs and AFIs.  Her AFIs have been low, her most recent measuring 6.4, which is less than a  3rd percentile for 37 weeks.  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  The patient is admitted at this time for a primary cesarean  section due to oligohydramnios, active HSV outbreak.  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  For the remainder of her history, see her hollister.   PHYSICAL EXAMINATION:  HEENT:  Normal.  LUNGS:  Clear.  CARDIAC:  Regular rate without rubs, murmurs, or gallops.  ABDOMEN:  Gravid.  Vertex fetus.  Reactive NST.  PELVIC:  Deferred.   ASSESSMENT AND PLAN:  A 35 year old, G57, P23, female at 46 weeks, with  gestational diabetes, diet controlled, oligohydramnios, recent active HSV  outbreak, elevated blood pressures near term in the 130-150/90 range, for  primary cesarean section.  Options for continued expectant management were  reviewed.  Risks and benefits discussed, to include possible catastrophic  outcome such as stillbirth.  Given the low AFI, increasing blood pressures,  I doubt that we will be  able to achieve an HSV free interval to allow for a  vaginal birth.  I reviewed with the patient what is involved with a cesarean  section, to include the expectant intraoperative and postoperative courses,  the risks of bleeding, transfusion, infection (both internal requiring  prolonged antibiotics, as well as incisional requiring opening and draining  of incisions, closure by secondary intention) were all discussed,  understood, and accepted.  The risks of inadvertent injury to internal  organs including bowel, bladder, ureters, vessels, and nerves necessitating  major exploratory reparative surgeries, future reparative surgeries  including ostomy formation, was all discussed, understood, and accepted.  The potential for fetal injury including  musculoskeletal, neural, scalpal injuries were all discussed, and she  understands that a cesarean section is not a guarantee that her child would  not get HSV, but certainly decreases the likelihood.  The patient's  questions were answered to her satisfaction, and she is ready to proceed  with surgery.       TPF/MEDQ  D:  03/06/2005  T:  03/06/2005  Job:  [id number]

## 2011-01-05 NOTE — Op Note (Signed)
NAMEMENAAL, RUSSUM               ACCOUNT NO.:  [id number]   MEDICAL RECORD NO.:  [id number]          PATIENT TYPE:  INP   LOCATION:  9144                          FACILITY:  [facility]   PHYSICIAN:  Timothy P. Fontaine, M.D.DATE OF BIRTH:  10-09-1975   DATE OF PROCEDURE:  03/07/2005  DATE OF DISCHARGE:                                 OPERATIVE REPORT   PREOPERATIVE DIAGNOSES:  1.  Pregnancy at 37 weeks.  2.  Gestational diabetes.  3.  Oligohydramnios.  4.  Pregnancy-induced hypertension.  5.  Active herpes simplex virus outbreak.   POSTOPERATIVE DIAGNOSES:  1.  Pregnancy at 37 weeks.  2.  Gestational diabetes.  3.  Oligohydramnios.  4.  Pregnancy-induced hypertension.  5.  Active herpes simplex virus outbreak.   PROCEDURE:  Primary low transverse cervical cesarean section.   SURGEON:  Timothy P. Fontaine, M.D.   ASSISTANT:  Scrub technician.   ANESTHETIC:  Spinal.   ESTIMATED BLOOD LOSS:  Less than 500 mL.   COMPLICATIONS:  None.   SPECIMEN:  Samples of cord blood, placenta, umbilical cord.   FINDINGS:  At 27, normal female, Apgars 0 and 9, weight 6 pounds 12 ounces.  Pelvic anatomy noted to be normal.   PROCEDURE:  The patient was taken to the operating room, underwent spinal  anesthesia, was placed in left tilt supine position, received an abdominal  preparation with Betadine solution, a Foley catheter had been placed using  sterile technique, and the patient was draped in the usual fashion, all per  nursing personnel.  After assuring adequate anesthesia, the abdomen was  sharply entered through a Pfannenstiel incision, achieving adequate  hemostasis at all levels.  The bladder flap was sharply and bluntly  developed without difficulty, and the uterus was sharply entered in the  midline and bluntly extended laterally.  The bulging membranes were  ruptured, the fluid noted to be clear, the infant's head delivered through  the incision, the nares and mouth suctioned,  the rest of the infant  delivered, the cord doubly clamped and cut, and the infant handed to  pediatrics in attendance.  Samples of cord blood were obtained.  The  placenta was spontaneously extruded and noted to be intact and was sent to  pathology.  The uterus was exteriorized, endometrial cavity explored with a  sponge to remove all placental and membrane fragments.  The patient received  antibiotic prophylaxis at this time.  The uterine incision was closed in two  layers using 0 Vicryl suture. first in a running interlocking stitch,  followed by an imbricating stitch.  The uterus was returned to the abdomen, which was copiously irrigated.  Adequate hemostasis was visualized, the anterior fascia reapproximated using  0 Vicryl suture in a running stitch.  The subcutaneous tissues were  irrigated, adequate hemostasis achieved with electrocautery, the skin  reapproximated using 4-0 Vicryl in a running subcuticular stitch, Steri-  Strips, Benzoin applied, sterile dressing applied.  The patient was taken to  recovery room in good condition having tolerated the procedure well.       TPF/MEDQ  D:  03/07/2005  T:  03/07/2005  Job:  [id number]

## 2011-01-05 NOTE — Discharge Summary (Signed)
NAMEARNETA, [name]               ACCOUNT NO.:  [id number]   MEDICAL RECORD NO.:  [id number]          PATIENT TYPE:  INP   LOCATION:  9144                          FACILITY:  [facility]   PHYSICIAN:  Ivor Costa. Farrel Gobble, M.D. DATE OF BIRTH:  1976-01-17   DATE OF ADMISSION:  03/07/2005  DATE OF DISCHARGE:  03/10/2005                                 DISCHARGE SUMMARY   PRINCIPAL DIAGNOSES:  1.  Thirty-seven week intrauterine pregnancy.  2.  Oligohydramnios.  3.  Gestational diabetes.  4.  Pregnancy-induced hypertension.  5.  Recent herpes simplex outbreak.  6.  Asymptomatic anemia.   HOSPITAL COURSE:  Please refer to the dictated H&P.  The patient underwent a  primary cesarean section, low flap transverse, by Dr. Audie Box under spinal  anesthesia with an estimated blood loss of approximately 500 mL for the  above indications, with delivery of a viable female with birth weight of 6  pounds 12 ounces, Apgars of 9 and 9, with normal uterus, tubes, and ovaries.  The patient's postoperative course was essentially unremarkable, so the  patient was ready for discharge by postoperative day #3, at which point she  was tolerating regular diet, her pain was well-controlled.  She was  discharged home with instructions to follow up in our office at six weeks  postpartum or sooner should she have any problems.  The patient was  instructed to take over-the-counter Motrin.   POSTOPERATIVE LABS:  Her hemoglobin was 8.9, her hematocrit was 26.6, her  white count was 10.9, and her platelets.      Ivor Costa. Farrel Gobble, M.D.  Electronically Signed     THL/MEDQ  D:  04/07/2005  T:  04/07/2005  Job:  [id number]

## 2011-01-24 ENCOUNTER — Ambulatory Visit (INDEPENDENT_AMBULATORY_CARE_PROVIDER_SITE_OTHER): Payer: 59 | Admitting: Gynecology

## 2011-01-24 DIAGNOSIS — Z30431 Encounter for routine checking of intrauterine contraceptive device: Secondary | ICD-10-CM

## 2011-01-31 ENCOUNTER — Ambulatory Visit (INDEPENDENT_AMBULATORY_CARE_PROVIDER_SITE_OTHER): Payer: 59 | Admitting: Women's Health

## 2011-01-31 DIAGNOSIS — B373 Candidiasis of vulva and vagina: Secondary | ICD-10-CM

## 2011-01-31 DIAGNOSIS — N898 Other specified noninflammatory disorders of vagina: Secondary | ICD-10-CM

## 2011-02-22 ENCOUNTER — Ambulatory Visit: Payer: 59 | Admitting: Gynecology

## 2011-05-14 LAB — BASIC METABOLIC PANEL
BUN: 12
Chloride: 103
Creatinine, Ser: 0.97
Glucose, Bld: 91

## 2011-05-14 LAB — URINALYSIS, ROUTINE W REFLEX MICROSCOPIC
Bilirubin Urine: NEGATIVE
Glucose, UA: NEGATIVE
Ketones, ur: NEGATIVE
Protein, ur: NEGATIVE

## 2011-05-14 LAB — CBC
MCHC: 33.6
MCV: 78
Platelets: 239
RBC: 5.48 — ABNORMAL HIGH
RDW: 15
WBC: 10.6 — ABNORMAL HIGH
WBC: 8.9

## 2011-05-14 LAB — POCT I-STAT, CHEM 8
BUN: 10
Chloride: 108
Potassium: 4.4
Sodium: 138

## 2011-05-14 LAB — HEMOGLOBIN A1C: Mean Plasma Glucose: 165

## 2011-05-14 LAB — POCT CARDIAC MARKERS
Myoglobin, poc: 61.9
Operator id: 146091
Troponin i, poc: <0.05

## 2011-05-14 LAB — ALBUMIN: Albumin: 3.8

## 2011-05-14 LAB — MAGNESIUM: Magnesium: 1.9

## 2011-05-14 LAB — TSH: TSH: 2.137

## 2011-05-14 LAB — D-DIMER, QUANTITATIVE: D-Dimer, Quant: 0.46

## 2011-05-14 LAB — PHOSPHORUS: Phosphorus: 3.1

## 2011-11-03 ENCOUNTER — Other Ambulatory Visit: Payer: Self-pay | Admitting: Internal Medicine

## 2012-01-21 ENCOUNTER — Emergency Department (HOSPITAL_COMMUNITY): Payer: 59

## 2012-01-21 ENCOUNTER — Emergency Department (HOSPITAL_COMMUNITY)
Admission: EM | Admit: 2012-01-21 | Discharge: 2012-01-22 | Disposition: A | Discharge status: Home / Self Care | Payer: 59 | Attending: Emergency Medicine | Admitting: Emergency Medicine

## 2012-01-21 ENCOUNTER — Encounter (HOSPITAL_COMMUNITY): Payer: Self-pay | Admitting: Emergency Medicine

## 2012-01-21 DIAGNOSIS — J36 Peritonsillar abscess: Secondary | ICD-10-CM | POA: Insufficient documentation

## 2012-01-21 DIAGNOSIS — R590 Localized enlarged lymph nodes: Secondary | ICD-10-CM

## 2012-01-21 DIAGNOSIS — R599 Enlarged lymph nodes, unspecified: Secondary | ICD-10-CM | POA: Insufficient documentation

## 2012-01-21 DIAGNOSIS — M542 Cervicalgia: Secondary | ICD-10-CM | POA: Insufficient documentation

## 2012-01-21 LAB — CBC
Platelets: 289 10*3/uL (ref 150–400)
RDW: 13.8 % (ref 11.5–15.5)
WBC: 13.5 10*3/uL — ABNORMAL HIGH (ref 4.0–10.5)

## 2012-01-21 LAB — POCT I-STAT, CHEM 8
Calcium, Ion: 1.23 mmol/L (ref 1.12–1.32)
Chloride: 105 mEq/L (ref 96–112)
HCT: 43 % (ref 36.0–46.0)
Potassium: 3.9 mEq/L (ref 3.5–5.1)

## 2012-01-21 MED ORDER — CLINDAMYCIN PHOSPHATE 900 MG/50ML IV SOLN
900.0000 mg | Freq: Once | INTRAVENOUS | Status: AC
  Administered 2012-01-21: 900 mg via INTRAVENOUS
  Filled 2012-01-21: qty 50

## 2012-01-21 MED ORDER — IOHEXOL 300 MG/ML  SOLN
100.0000 mL | Freq: Once | INTRAMUSCULAR | Status: AC | PRN
  Administered 2012-01-21: 100 mL via INTRAVENOUS

## 2012-01-21 NOTE — ED Notes (Signed)
Message sent to pharmacy to send meds.

## 2012-01-21 NOTE — ED Provider Notes (Signed)
History     CSN: [id number]  Arrival date & time 01/21/12  1925   First MD Initiated Contact with Patient 01/21/12 2046      Chief Complaint  Patient presents with  . Abscess    (Consider location/radiation/quality/duration/timing/severity/associated sxs/prior treatment) HPI Comments: Patient here with worsening of right sided neck pain - she states that she initially thought she had the flu because she had low grade fever, runny nose, and sore throat.  She states that the flu like symptoms have gradually improved but she began to notice pain to the right side of her neck and swelling, states that when she awoke this morning the pain and swelling was far worse than it had been before.  She denies fever, chills, difficulty breathing, difficulty swallowing but does report continued sore throat with the sensation that the right side of her throat is larger than the left.    Patient is a 36 y.o. female presenting with abscess. The history is provided by the patient. No language interpreter was used.  Abscess  This is a new problem. The current episode started less than one week ago. The onset was gradual. The problem occurs rarely. The problem has been gradually worsening. The abscess is present on the neck. The problem is severe. The abscess is characterized by swelling and painfulness. The patient was exposed to OTC medications. The abscess first occurred at home. Associated symptoms include sore throat. Pertinent negatives include no anorexia, no decrease in physical activity, not sleeping less, not drinking less, no fever, no fussiness, not sleeping more, no diarrhea, no vomiting, no congestion, no rhinorrhea, no decreased responsiveness and no cough. Her past medical history does not include skin abscesses in family. There were no sick contacts. She has received no recent medical care.    Past Medical History  Diagnosis Date  . IUD     MIRENA INSERTED 01/24/11  . Hypertension   . Diabetes  mellitus     HISTORY OF GESTATIONAL DIABETES    Past Surgical History  Procedure Date  . Cesarean section 2006    Family History  Problem Relation Age of Onset  . Diabetes Mother   . Heart disease Mother   . Hypertension Maternal Aunt     History  Substance Use Topics  . Smoking status: Unknown If Ever Smoked  . Smokeless tobacco: Not on file  . Alcohol Use: No    OB History    Grav Para Term Preterm Abortions TAB SAB Ect Mult Living                  Review of Systems  Constitutional: Negative for fever and decreased responsiveness.  HENT: Positive for sore throat and neck pain. Negative for congestion, rhinorrhea, drooling, trouble swallowing, neck stiffness and voice change.   Respiratory: Negative for cough.   Gastrointestinal: Negative for vomiting, diarrhea and anorexia.  All other systems reviewed and are negative.    Allergies  Review of patient's allergies indicates no known allergies.  Home Medications   Current Outpatient Rx  Name Route Sig Dispense Refill  . TRIBENZOR 20-5-12.5 MG PO TABS  TAKE ONE TABLET BY MOUTH EVERY DAY 90 tablet 1    BP 154/115  Pulse 98  Temp(Src) 98.9 F (37.2 C) (Oral)  Resp 20  SpO2 97%  Physical Exam  Nursing note and vitals reviewed. Constitutional: She is oriented to person, place, and time. She appears well-developed and well-nourished. No distress.  HENT:  Head: Normocephalic and atraumatic.  Right Ear: External ear normal.  Left Ear: External ear normal.  Nose: Nose normal.  Mouth/Throat: Oropharynx is clear and moist. No oropharyngeal exudate.       Mild pharyngeal erythema  Eyes: Conjunctivae are normal. Pupils are equal, round, and reactive to light. No scleral icterus.  Neck: Normal range of motion. Neck supple. No JVD present. No spinous process tenderness and no muscular tenderness present. Edema present. No tracheal deviation and no erythema present. No thyromegaly present.  Cardiovascular: Normal  rate, regular rhythm and normal heart sounds.  Exam reveals no gallop and no friction rub.   No murmur heard. Pulmonary/Chest: Effort normal and breath sounds normal. No stridor. No respiratory distress. She has no wheezes. She has no rales. She exhibits no tenderness.  Abdominal: Soft. Bowel sounds are normal. She exhibits no distension. There is no tenderness.  Musculoskeletal: Normal range of motion. She exhibits no edema and no tenderness.  Lymphadenopathy:    She has cervical adenopathy.  Neurological: She is alert and oriented to person, place, and time. No cranial nerve deficit.  Skin: Skin is warm and dry. No rash noted. No erythema. No pallor.  Psychiatric: She has a normal mood and affect. Her behavior is normal. Judgment and thought content normal.    ED Course  Procedures (including critical care time)   Labs Reviewed  CBC  DIFFERENTIAL   No results found. Results for orders placed during the hospital encounter of 01/21/12  CBC      Component Value Range   WBC 13.5 (*) 4.0 - 10.5 (K/uL)   RBC 5.19 (*) 3.87 - 5.11 (MIL/uL)   Hemoglobin 13.5  12.0 - 15.0 (g/dL)   HCT 16.1  09.6 - 04.5 (%)   MCV 74.4 (*) 78.0 - 100.0 (fL)   MCH 26.0  26.0 - 34.0 (pg)   MCHC 35.0  30.0 - 36.0 (g/dL)   RDW 40.9  81.1 - 91.4 (%)   Platelets 289  150 - 400 (K/uL)  DIFFERENTIAL      Component Value Range   Neutrophils Relative 68  43 - 77 (%)   Lymphocytes Relative 19  12 - 46 (%)   Monocytes Relative 6  3 - 12 (%)   Eosinophils Relative 7 (*) 0 - 5 (%)   Basophils Relative 0  0 - 1 (%)   Neutro Abs 9.2 (*) 1.7 - 7.7 (K/uL)   Lymphs Abs 2.6  0.7 - 4.0 (K/uL)   Monocytes Absolute 0.8  0.1 - 1.0 (K/uL)   Eosinophils Absolute 0.9 (*) 0.0 - 0.7 (K/uL)   Basophils Absolute 0.0  0.0 - 0.1 (K/uL)   Smear Review MORPHOLOGY UNREMARKABLE    POCT I-STAT, CHEM 8      Component Value Range   Sodium 141  135 - 145 (mEq/L)   Potassium 3.9  3.5 - 5.1 (mEq/L)   Chloride 105  96 - 112 (mEq/L)    BUN 8  6 - 23 (mg/dL)   Creatinine, Ser 7.82  0.50 - 1.10 (mg/dL)   Glucose, Bld 956 (*) 70 - 99 (mg/dL)   Calcium, Ion 2.13  0.86 - 1.32 (mmol/L)   TCO2 24  0 - 100 (mmol/L)   Hemoglobin 14.6  12.0 - 15.0 (g/dL)   HCT 57.8  46.9 - 62.9 (%)   Ct Soft Tissue Neck W Contrast  01/21/2012  *RADIOLOGY REPORT*  Clinical Data: Right-sided neck pain and swelling.  CT NECK WITH CONTRAST  Technique:  Multidetector CT imaging of the  neck was performed with intravenous contrast.  Contrast: OMNIPAQUE IOHEXOL 300 MG/ML  SOLN  Comparison: None  Findings: The visualized portion of the brain appears normal.  The skull base is unremarkable.  Minimal right-sided maxillary sinus disease is noted.  The tongue base and floor the mouth are unremarkable.  The adenoids are very prominent as are the lingual and faucial tonsils.  The tonsils appear inflamed and there is a vague area of low attenuation on the right which could be an early abscess and needs close follow-up.  There is suppurative right-sided neck adenopathy along with inflammation and probable inflammatory phlegmon involving the right sternocleidomastoid muscle. Borderline enlarged left-sided neck nodes are also noted.  The epiglottis is normal.  The paraglottic fat planes are maintained.  The parotid and submandibular glands appear normal and symmetric bilaterally.  The major vascular structures are patent. The thyroid gland appears normal.  There are small bilateral supraclavicular lymph nodes.  The lung apices are clear.  IMPRESSION:  1. CT findings most likely due to pharyngitis, tonsillitis, suppurative right-sided lymphadenitis and associated inflammation in and around the right sternocleidomastoid muscle. 2.  Cannot exclude early right tonsillar abscess. 3.  Recommend ENT consultation and close clinical follow-up.  Original Report Authenticated By: P. Loralie Champagne, M.D.      Right cervical Lymphadenopathy Right early peritonsilar abscess    MDM    Patient with early right PTA and lymphadenopathy associated with this - I have spoken with Dr. Suszanne Conners with ENT who has reviewed the patient's CT scan.  He recommends IV clindamycin here and then oral and he will see the patient in the office this week.        Izola Price [place], [place] 01/22/12 0005

## 2012-01-21 NOTE — ED Notes (Signed)
Patient complaining of abscess/neck swelling on the right side.  Airway clear; patient denies any respiratory issues.  Patient reports worsening pain upon moving neck.

## 2012-01-21 NOTE — ED Notes (Signed)
Patient transported to CT 

## 2012-01-22 LAB — DIFFERENTIAL
Basophils Absolute: 0 10*3/uL (ref 0.0–0.1)
Lymphocytes Relative: 19 % (ref 12–46)
Lymphs Abs: 2.6 10*3/uL (ref 0.7–4.0)
Monocytes Relative: 6 % (ref 3–12)
Neutrophils Relative %: 68 % (ref 43–77)

## 2012-01-22 MED ORDER — HYDROCODONE-ACETAMINOPHEN 7.5-500 MG/15ML PO SOLN: 15.0000 mL | Freq: Four times a day (QID) | ORAL | Status: AC | PRN

## 2012-01-22 MED ORDER — CLINDAMYCIN HCL 150 MG PO CAPS: 150.0000 mg | ORAL_CAPSULE | Freq: Four times a day (QID) | ORAL | Status: AC

## 2012-01-22 NOTE — Discharge Instructions (Signed)
Peritonsillar Abscess  A peritonsillar abscess is a collection of pus located in the back of the throat behind the tonsils. It usually occurs when a streptococcal infection of the throat or tonsils spreads into the space around the tonsils. They are almost always caused by the streptococcal germ (bacteria). The treatment of a peritonsillar abscess is most often drainage accomplished by putting a needle into the abscess or cutting (incising) and draining the abscess. This is most often followed with a course of antibiotics.  HOME CARE INSTRUCTIONS   If your abscess was drained by your caregiver today, rinse your throat (gargle) with warm salt water four times per day or as needed for comfort. Do not swallow this mixture. Mix 1 teaspoon of salt in 8 ounces of warm water for gargling.   Rest in bed as needed. Resume activities as able.   Apply cold to your neck for pain relief. Fill a plastic bag with ice and wrap it in a towel. Hold the ice on your neck for 20 minutes 4 times per day.   Eat a soft or liquid diet as tolerated while your throat remains sore. Popsicles and ice cream may be good early choices. Drinking plenty of cold fluids will probably be soothing and help take swelling down in between the warm gargles.   Only take over-the-counter or prescription medicines for pain, discomfort, or fever as directed by your caregiver. Do not use aspirin unless directed by your physician. Aspirin slows down the clotting process. It can also cause bleeding from the drainage area if this was needled or incised today.   If antibiotics were prescribed, take them as directed for the full course of the prescription. Even if you feel you are well, you need to take them.  SEEK MEDICAL CARE IF:    You have increased pain, swelling, redness, or drainage in your throat.   You develop signs of infection such as dizziness, headache, lethargy, or generalized feelings of illness.   You have difficulty breathing, swallowing or  eating.   You show signs of becoming dehydrated (lightheadedness when standing, decreased urine output, a fast heart rate, or dry mouth and mucous membranes).  SEEK IMMEDIATE MEDICAL CARE IF:    You have a fever.   You are coughing up or vomiting blood.   You develop more severe throat pain uncontrolled with medicines or you start to drool.   You develop difficulty breathing, talking, or find it easier to breathe while leaning forward.  Document Released: 08/06/2005 Document Revised: 07/26/2011 Document Reviewed: 03/19/2008  ExitCare Patient Information 2012 [employer].

## 2012-01-22 NOTE — ED Notes (Signed)
Patient is AOx4 and comfortable with her discharge instructions. 

## 2012-01-23 NOTE — ED Provider Notes (Signed)
Medical screening examination/treatment/procedure(s) were performed by non-physician practitioner and as supervising physician I was immediately available for consultation/collaboration.  Flint Melter, MD 01/23/12 2340

## 2012-03-03 ENCOUNTER — Emergency Department (HOSPITAL_COMMUNITY): Payer: 59

## 2012-03-03 ENCOUNTER — Observation Stay (HOSPITAL_COMMUNITY)
Admission: EM | Admit: 2012-03-03 | Discharge: 2012-03-04 | Disposition: A | Discharge status: Home / Self Care | Payer: 59 | Attending: Emergency Medicine | Admitting: Emergency Medicine

## 2012-03-03 ENCOUNTER — Encounter (HOSPITAL_COMMUNITY): Payer: Self-pay | Admitting: *Deleted

## 2012-03-03 ENCOUNTER — Telehealth: Payer: Self-pay | Admitting: Internal Medicine

## 2012-03-03 DIAGNOSIS — R059 Cough, unspecified: Secondary | ICD-10-CM | POA: Insufficient documentation

## 2012-03-03 DIAGNOSIS — R5381 Other malaise: Secondary | ICD-10-CM | POA: Insufficient documentation

## 2012-03-03 DIAGNOSIS — M549 Dorsalgia, unspecified: Secondary | ICD-10-CM | POA: Insufficient documentation

## 2012-03-03 DIAGNOSIS — Z8249 Family history of ischemic heart disease and other diseases of the circulatory system: Secondary | ICD-10-CM | POA: Insufficient documentation

## 2012-03-03 DIAGNOSIS — R0602 Shortness of breath: Principal | ICD-10-CM | POA: Insufficient documentation

## 2012-03-03 DIAGNOSIS — I10 Essential (primary) hypertension: Secondary | ICD-10-CM | POA: Insufficient documentation

## 2012-03-03 DIAGNOSIS — Z975 Presence of (intrauterine) contraceptive device: Secondary | ICD-10-CM | POA: Insufficient documentation

## 2012-03-03 DIAGNOSIS — R05 Cough: Secondary | ICD-10-CM | POA: Insufficient documentation

## 2012-03-03 LAB — URINE MICROSCOPIC-ADD ON

## 2012-03-03 LAB — CBC WITH DIFFERENTIAL/PLATELET
Basophils Absolute: 0 10*3/uL (ref 0.0–0.1)
HCT: 39.1 % (ref 36.0–46.0)
Lymphocytes Relative: 32 % (ref 12–46)
Monocytes Relative: 8 % (ref 3–12)
Neutro Abs: 4.8 10*3/uL (ref 1.7–7.7)
RDW: 13.9 % (ref 11.5–15.5)
WBC: 8.7 10*3/uL (ref 4.0–10.5)

## 2012-03-03 LAB — PRO B NATRIURETIC PEPTIDE: Pro B Natriuretic peptide (BNP): 6.8 pg/mL (ref 0–125)

## 2012-03-03 LAB — D-DIMER, QUANTITATIVE: D-Dimer, Quant: 0.75 ug/mL-FEU — ABNORMAL HIGH (ref 0.00–0.48)

## 2012-03-03 LAB — POCT I-STAT TROPONIN I

## 2012-03-03 LAB — URINALYSIS, ROUTINE W REFLEX MICROSCOPIC
Bilirubin Urine: NEGATIVE
Glucose, UA: NEGATIVE mg/dL
Specific Gravity, Urine: 1.019 (ref 1.005–1.030)
pH: 6 (ref 5.0–8.0)

## 2012-03-03 LAB — RAPID URINE DRUG SCREEN, HOSP PERFORMED
Cocaine: NOT DETECTED
Opiates: NOT DETECTED

## 2012-03-03 LAB — BASIC METABOLIC PANEL
BUN: 7 mg/dL (ref 6–23)
Chloride: 99 mEq/L (ref 96–112)
GFR calc Af Amer: >90 mL/min (ref >90)
Potassium: 3.4 mEq/L — ABNORMAL LOW (ref 3.5–5.1)
Sodium: 139 mEq/L (ref 135–145)

## 2012-03-03 LAB — PREGNANCY, URINE: Preg Test, Ur: NEGATIVE

## 2012-03-03 MED ORDER — IOHEXOL 350 MG/ML SOLN
100.0000 mL | Freq: Once | INTRAVENOUS | Status: AC | PRN
  Administered 2012-03-03: 100 mL via INTRAVENOUS

## 2012-03-03 MED ORDER — ASPIRIN 81 MG PO CHEW
162.0000 mg | CHEWABLE_TABLET | Freq: Once | ORAL | Status: AC
  Administered 2012-03-03: 162 mg via ORAL
  Filled 2012-03-03: qty 2

## 2012-03-03 NOTE — Telephone Encounter (Signed)
Caller: Candia/Mother; PCP: Sanda Linger; CB#: [phone];  Call regarding Cough; sx started 02/28/12; other sx include sob, back pain, dry cough; left arm is aching; these other sx started 03/01/12; no chest pain;  main sx today is arm aching; doesn't think she is sob at present; left arm feels numb;  Triaged per Arm Non Injury Guideline; Call 911 d/t new unexplained numbness esecially on one side of the body; pt prefers OV if possible; office called and instructed to send to ED; pt will comply and go to [facility] by provate vehicle per pt choice; another adult to drive

## 2012-03-03 NOTE — ED Notes (Addendum)
Pt reports dry cough, sob, and mid back pain. No swelling to lower extremities. Pt reports sob at rest. Denies chest pain. Pt reports nausea. Reports hx of chf.

## 2012-03-03 NOTE — ED Notes (Signed)
Complaining of back pain, SOB, nausea and numbness left arm. Started Saturday. Also complaining of cough which started Thursday. Denies pain at this time. Also denies any chest pain

## 2012-03-03 NOTE — ED Provider Notes (Signed)
History     CSN: [id number]  Arrival date & time 03/03/12  1344   First MD Initiated Contact with Patient 03/03/12 1534      Chief Complaint  Patient presents with  . Cough  . Back Pain  . Shortness of Breath    (Consider location/radiation/quality/duration/timing/severity/associated sxs/prior treatment) HPI Comments: 36 y/o female with hx of HTN and family hx of premature CAD comes in with cc of sob and some back pain. The back pain has been going on for the past few months, and is located between the shoulder blades. Patient has not given it much attention, until she started getting SOB. The SOB has been acute, over the past 2-3 days. The SOB is exertional, but sometimes even present at rest. Pt now gets sob when walking from parking lot to her home - which is new. There is no associated chest pain, and the back pain is not related to the sob either. Pt denies any cough, n/v/f and there is no hx of PE, DVT or risk factors for the same. Pt denies any orthopnea, or PND.   The history is provided by the patient.    Past Medical History  Diagnosis Date  . IUD     MIRENA INSERTED 01/24/11  . Hypertension   . Diabetes mellitus     HISTORY OF GESTATIONAL DIABETES    Past Surgical History  Procedure Date  . Cesarean section 2006    Family History  Problem Relation Age of Onset  . Diabetes Mother   . Heart disease Mother   . Hypertension Maternal Aunt     History  Substance Use Topics  . Smoking status: Unknown If Ever Smoked  . Smokeless tobacco: Not on file  . Alcohol Use: No    OB History    Grav Para Term Preterm Abortions TAB SAB Ect Mult Living                  Review of Systems  Constitutional: Positive for fatigue. Negative for fever, diaphoresis and activity change.  HENT: Negative for facial swelling and neck pain.   Respiratory: Positive for cough and shortness of breath. Negative for chest tightness and wheezing.   Cardiovascular: Negative for chest  pain.  Gastrointestinal: Negative for nausea, vomiting, abdominal pain, diarrhea, constipation, blood in stool and abdominal distention.  Genitourinary: Negative for hematuria and difficulty urinating.  Musculoskeletal: Positive for back pain.  Skin: Negative for color change.  Neurological: Negative for speech difficulty.  Hematological: Does not bruise/bleed easily.  Psychiatric/Behavioral: Negative for confusion.    Allergies  Review of patient's allergies indicates no known allergies.  Home Medications   Current Outpatient Rx  Name Route Sig Dispense Refill  . OLMESARTAN-AMLODIPINE-HCTZ 20-5-12.5 MG PO TABS Oral Take 1 tablet by mouth daily.      BP 144/102  Pulse 115  Temp 98.3 F (36.8 C) (Oral)  Resp 18  SpO2 96%  Physical Exam  Constitutional: She is oriented to person, place, and time. She appears well-developed.  HENT:  Head: Normocephalic and atraumatic.  Eyes: Conjunctivae and EOM are normal. Pupils are equal, round, and reactive to light.  Neck: Normal range of motion. Neck supple.  Cardiovascular: Normal rate, regular rhythm, S1 normal, S2 normal, normal heart sounds, intact distal pulses and normal pulses.  Exam reveals no gallop.   No murmur heard.      No JVD  Pulmonary/Chest: Effort normal and breath sounds normal. No respiratory distress.  Abdominal: Soft. Bowel  sounds are normal. She exhibits no distension. There is no tenderness. There is no rebound and no guarding.  Neurological: She is alert and oriented to person, place, and time.  Skin: Skin is warm and dry.    ED Course  Procedures (including critical care time)   Labs Reviewed  CBC WITH DIFFERENTIAL  BASIC METABOLIC PANEL  D-DIMER, QUANTITATIVE  PRO B NATRIURETIC PEPTIDE  URINE RAPID DRUG SCREEN (HOSP PERFORMED)  URINALYSIS, ROUTINE W REFLEX MICROSCOPIC  PREGNANCY, URINE   Dg Chest 2 View  03/03/2012  *RADIOLOGY REPORT*  Clinical Data: Cough, shortness of breath and wheezing.  Upper  back pain.  CHEST - 2 VIEW  Comparison: CT chest and chest radiograph 04/26/2009.  Findings: Trachea is midline.  Heart size normal.  Lungs are somewhat low in volume but clear.  No pleural fluid.  IMPRESSION: No acute findings.  Original Report Authenticated By: Reyes Ivan, M.D.     No diagnosis found.    MDM  35 y/o with hx of HTN, family hx of premature CAD (mother at age 33 passed away due to MI) comes in with cc of sob and some back pain. Pt is noted to be tachycardic. Her Wells for PE is 1.5 - she is low risk and we will get d-dimer. Other concern is that the SOB, which is exertional in nature, is angina equivalent. Her only other risk factor is HTN - so if the PE workup is negative, we will admit patient for ACS workup.   Date: 03/03/2012  Rate: 111  Rhythm: sinus tachycardia  QRS Axis: normal  Intervals: normal  ST/T Wave abnormalities: normal  Conduction Disutrbances:none  Narrative Interpretation:   Old EKG Reviewed: unchanged        8:38 PM PE study is negative. Will admit to CDU.  Derwood Kaplan, MD 03/04/12 [phone]

## 2012-03-03 NOTE — ED Provider Notes (Signed)
Assumed pt care in CDU. Pt will be on chest pain protocol with cardiac stress test tomorrow.  Pt current has no complaint.  No c/p, sob, or back pain.  On exam, heart RRR, no m/r/g, lung CTAB, abd soft, nontender, no pedal edema, ambulate without difficulty.    11:38 PM Report given to Dr. Judd Lien, who will continue monitor pt over night.  Cardiac stress test tomorrow.    Fayrene Helper, PA-C 03/03/12 2338

## 2012-03-04 DIAGNOSIS — R0602 Shortness of breath: Secondary | ICD-10-CM

## 2012-03-04 LAB — POCT I-STAT TROPONIN I
Troponin i, poc: 0 ng/mL (ref 0.00–0.08)
Troponin i, poc: 0.02 ng/mL (ref 0.00–0.08)

## 2012-03-04 MED ORDER — ALBUTEROL SULFATE HFA 108 (90 BASE) MCG/ACT IN AERS
2.0000 | INHALATION_SPRAY | RESPIRATORY_TRACT | Status: DC | PRN
  Administered 2012-03-04: 2 via RESPIRATORY_TRACT
  Filled 2012-03-04: qty 6.7

## 2012-03-04 NOTE — ED Notes (Signed)
CALLED VASCULAR LAB TO INQUIRE IF THEY HAD ANY MORE INFO ON PT STUDY RESULTS. PER RICH DR HILTY IS STILL IN CATH LAB. BILL[facility] DID VERIFY THAT THE IMAGES ARE AVAILABLE FOR DR HILTY. DR HILTY SPOKE TO . HE ADVISES THAT HE HAS FINISHED HIS CASE AND THAT AFTER HE FINISHES TALKING TO FAMILY AND WRITES  ORDERS HE WILL LOOK AT PT STUDY

## 2012-03-04 NOTE — ED Provider Notes (Signed)
Stress echo is negative. Patient asymptomatic. Discussed discharge with her and follow up with Dr. Santa Genera. Will give inhaler for next symptom episode.   Rodena Medin, PA-C 03/04/12 1323

## 2012-03-04 NOTE — ED Notes (Signed)
MEAL ORDERED 

## 2012-03-04 NOTE — Progress Notes (Signed)
Observation review is complete. 

## 2012-03-04 NOTE — ED Provider Notes (Signed)
Medical screening examination/treatment/procedure(s) were performed by non-physician practitioner and as supervising physician I was immediately available for consultation/collaboration.  Janie Capp, MD 03/04/12 1546 

## 2012-03-04 NOTE — ED Notes (Signed)
Rich from vascular lab called and he advises the system has been down and the images have just been sent. Dr Rennis Golden is reading today and he is in the cath lab at this time. Rich is calling cath lab to make md aware the images are ready for reading pending him finishing the cath

## 2012-03-04 NOTE — Discharge Instructions (Signed)
USE INHALER WHEN SYMPTOMATIC. FOLLOW UP WITH DR. Yetta Barre IN 1-2 WEEKS TO RE-EVALUATE SYMPTOMS OF SHORTNESS OF BREATH AND BACK PAIN. RETURN HERE WITH ANY SEVERE PAIN, HIGH FEVER, OR NEW CONCERN.

## 2012-03-04 NOTE — ED Notes (Signed)
Spoke with rich in vascular. He advises that they will be sending for pt shortly

## 2012-03-07 NOTE — ED Provider Notes (Signed)
Medical screening examination/treatment/procedure(s) were conducted as a shared visit with non-physician practitioner(s) and myself.  I personally evaluated the patient during the encounter  Derwood Kaplan, MD 03/07/12 [phone]

## 2012-05-23 ENCOUNTER — Other Ambulatory Visit: Payer: Self-pay | Admitting: Internal Medicine

## 2012-08-29 ENCOUNTER — Other Ambulatory Visit: Payer: Self-pay | Admitting: Internal Medicine

## 2012-12-17 ENCOUNTER — Other Ambulatory Visit: Payer: Self-pay | Admitting: Internal Medicine

## 2012-12-19 ENCOUNTER — Other Ambulatory Visit: Payer: Self-pay | Admitting: Internal Medicine

## 2012-12-26 ENCOUNTER — Telehealth: Payer: Self-pay

## 2012-12-26 NOTE — Telephone Encounter (Signed)
Phone call from pt stating she is needing a refill on her Tribenzor 20/5/12.5 mg. Per Dr Yetta Barre pt is needing an appt and a message was left for pt to call and schedule an appt before further refills can be given.

## 2012-12-29 ENCOUNTER — Other Ambulatory Visit: Payer: Self-pay

## 2012-12-29 MED ORDER — OLMESARTAN-AMLODIPINE-HCTZ 20-5-12.5 MG PO TABS: ORAL_TABLET | ORAL | Status: DC

## 2012-12-29 NOTE — Progress Notes (Signed)
Pt called requesting refill of BP medication until she is able to make an appointment in 2 weeks.

## 2013-02-12 ENCOUNTER — Ambulatory Visit (INDEPENDENT_AMBULATORY_CARE_PROVIDER_SITE_OTHER): Payer: 59 | Admitting: Internal Medicine

## 2013-02-12 ENCOUNTER — Other Ambulatory Visit (INDEPENDENT_AMBULATORY_CARE_PROVIDER_SITE_OTHER): Payer: 59

## 2013-02-12 ENCOUNTER — Encounter: Payer: Self-pay | Admitting: Internal Medicine

## 2013-02-12 VITALS — BP 190/120 | HR 68 | Temp 98.7°F | Resp 16 | Wt 217.0 lb

## 2013-02-12 DIAGNOSIS — IMO0001 Reserved for inherently not codable concepts without codable children: Secondary | ICD-10-CM

## 2013-02-12 DIAGNOSIS — E119 Type 2 diabetes mellitus without complications: Secondary | ICD-10-CM | POA: Insufficient documentation

## 2013-02-12 DIAGNOSIS — I10 Essential (primary) hypertension: Secondary | ICD-10-CM

## 2013-02-12 DIAGNOSIS — E876 Hypokalemia: Secondary | ICD-10-CM

## 2013-02-12 LAB — CBC WITH DIFFERENTIAL/PLATELET
Basophils Relative: 0.6 % (ref 0.0–3.0)
Eosinophils Relative: 9.8 % — ABNORMAL HIGH (ref 0.0–5.0)
Lymphocytes Relative: 33.2 % (ref 12.0–46.0)
Neutrophils Relative %: 48.6 % (ref 43.0–77.0)
RBC: 5.07 Mil/uL (ref 3.87–5.11)
WBC: 9.5 10*3/uL (ref 4.5–10.5)

## 2013-02-12 LAB — URINALYSIS, ROUTINE W REFLEX MICROSCOPIC
Ketones, ur: NEGATIVE
Specific Gravity, Urine: 1.02 (ref 1.000–1.030)
Urine Glucose: NEGATIVE
Urobilinogen, UA: 0.2 (ref 0.0–1.0)

## 2013-02-12 LAB — LIPID PANEL
Cholesterol: 190 mg/dL (ref 0–200)
VLDL: 19.8 mg/dL (ref 0.0–40.0)

## 2013-02-12 LAB — COMPREHENSIVE METABOLIC PANEL
Albumin: 3.5 g/dL (ref 3.5–5.2)
BUN: 15 mg/dL (ref 6–23)
Calcium: 9.3 mg/dL (ref 8.4–10.5)
Chloride: 103 mEq/L (ref 96–112)
GFR: 104.19 mL/min (ref >60.00)
Glucose, Bld: 97 mg/dL (ref 70–99)
Potassium: 3.4 mEq/L — ABNORMAL LOW (ref 3.5–5.1)

## 2013-02-12 LAB — HEMOGLOBIN A1C: Hgb A1c MFr Bld: 6.6 % — ABNORMAL HIGH (ref 4.6–6.5)

## 2013-02-12 MED ORDER — OLMESARTAN-AMLODIPINE-HCTZ 40-5-12.5 MG PO TABS: 1.0000 | ORAL_TABLET | Freq: Every day | ORAL | Status: DC

## 2013-02-12 NOTE — Patient Instructions (Signed)

## 2013-02-12 NOTE — Assessment & Plan Note (Signed)
I will recheck her A1C and will treat if needed 

## 2013-02-12 NOTE — Progress Notes (Signed)
  Subjective:    Patient ID: [name], female    DOB: 1976/02/12, 37 y.o.   MRN: [id number]  Hypertension This is a chronic problem. The current episode started more than 1 year ago. The problem is uncontrolled. Pertinent negatives include no anxiety, blurred vision, chest pain, headaches, malaise/fatigue, neck pain, orthopnea, palpitations, peripheral edema, PND, shortness of breath or sweats. Agents associated with hypertension include estrogens. Past treatments include calcium channel blockers, angiotensin blockers and diuretics. Compliance problems include exercise, diet, medication cost and psychosocial issues (she ran out of her meds a few weeks ago).       Review of Systems  Constitutional: Negative.  Negative for fever, chills, malaise/fatigue, diaphoresis, activity change, appetite change, fatigue and unexpected weight change.  HENT: Negative.  Negative for neck pain.   Eyes: Negative.  Negative for blurred vision.  Respiratory: Negative.  Negative for choking, chest tightness, shortness of breath, wheezing and stridor.   Cardiovascular: Negative.  Negative for chest pain, palpitations, orthopnea, leg swelling and PND.  Gastrointestinal: Negative.  Negative for nausea, vomiting, abdominal pain, diarrhea and constipation.  Endocrine: Negative.  Negative for polydipsia, polyphagia and polyuria.  Genitourinary: Negative.   Musculoskeletal: Negative.   Skin: Negative.   Allergic/Immunologic: Negative.   Neurological: Negative.  Negative for dizziness, tremors, light-headedness and headaches.  Hematological: Negative.  Negative for adenopathy. Does not bruise/bleed easily.  Psychiatric/Behavioral: Negative.        Objective:   Physical Exam  Vitals reviewed. Constitutional: She is oriented to person, place, and time. She appears well-developed and well-nourished. No distress.  HENT:  Head: Normocephalic and atraumatic.  Mouth/Throat: Oropharynx is clear and moist. No  oropharyngeal exudate.  Eyes: Conjunctivae are normal. Right eye exhibits no discharge. Left eye exhibits no discharge. No scleral icterus.  Neck: Normal range of motion. Neck supple. No JVD present. No tracheal deviation present. No thyromegaly present.  Cardiovascular: Normal rate, regular rhythm, normal heart sounds and intact distal pulses.  Exam reveals no gallop and no friction rub.   No murmur heard. Pulmonary/Chest: Effort normal and breath sounds normal. No stridor. No respiratory distress. She has no wheezes. She has no rales. She exhibits no tenderness.  Abdominal: Soft. Bowel sounds are normal. She exhibits no distension and no mass. There is no tenderness. There is no rebound and no guarding.  Musculoskeletal: Normal range of motion. She exhibits no edema and no tenderness.  Lymphadenopathy:    She has no cervical adenopathy.  Neurological: She is oriented to person, place, and time.  Skin: Skin is warm and dry. No rash noted. She is not diaphoretic. No erythema. No pallor.  Psychiatric: She has a normal mood and affect. Her behavior is normal. Judgment and thought content normal.          Assessment & Plan:

## 2013-02-12 NOTE — Assessment & Plan Note (Signed)
BP is not well controlled She will restart tribenzor Today I will check her labs to look for end organ damage and secondary causes

## 2013-02-13 ENCOUNTER — Encounter: Payer: Self-pay | Admitting: Internal Medicine

## 2013-02-13 DIAGNOSIS — E876 Hypokalemia: Secondary | ICD-10-CM | POA: Insufficient documentation

## 2013-02-13 MED ORDER — POTASSIUM CHLORIDE CRYS ER 20 MEQ PO TBCR: 20.0000 meq | EXTENDED_RELEASE_TABLET | Freq: Every day | ORAL | Status: DC

## 2013-02-13 NOTE — Addendum Note (Signed)
Addended by: Etta Grandchild on: 02/13/2013 07:26 AM   Modules accepted: Orders

## 2013-08-31 ENCOUNTER — Telehealth: Payer: Self-pay | Admitting: *Deleted

## 2013-08-31 DIAGNOSIS — I10 Essential (primary) hypertension: Secondary | ICD-10-CM

## 2013-08-31 MED ORDER — OLMESARTAN-AMLODIPINE-HCTZ 40-5-12.5 MG PO TABS: 1.0000 | ORAL_TABLET | Freq: Every day | ORAL | Status: DC

## 2013-08-31 NOTE — Telephone Encounter (Signed)
Pt phoned in requesting refill for his bp meds.  According to our EMR, he has not been seen by PCP since 02/12/13 and has no upcoming appts scheduled at this time.  Attempted to return pt's phone call, [facility] on voicemail.

## 2013-08-31 NOTE — Telephone Encounter (Signed)
Chronic medication filled per protocol, pt has been seen within the last year.

## 2014-03-08 ENCOUNTER — Encounter: Payer: Self-pay | Admitting: Internal Medicine

## 2014-03-08 ENCOUNTER — Ambulatory Visit (INDEPENDENT_AMBULATORY_CARE_PROVIDER_SITE_OTHER): Payer: 59 | Admitting: Internal Medicine

## 2014-03-08 ENCOUNTER — Other Ambulatory Visit (INDEPENDENT_AMBULATORY_CARE_PROVIDER_SITE_OTHER): Payer: 59

## 2014-03-08 VITALS — BP 128/98 | HR 103 | Temp 98.6°F | Wt 214.2 lb

## 2014-03-08 DIAGNOSIS — E1165 Type 2 diabetes mellitus with hyperglycemia: Secondary | ICD-10-CM

## 2014-03-08 DIAGNOSIS — E876 Hypokalemia: Secondary | ICD-10-CM

## 2014-03-08 DIAGNOSIS — R200 Anesthesia of skin: Secondary | ICD-10-CM

## 2014-03-08 DIAGNOSIS — I10 Essential (primary) hypertension: Secondary | ICD-10-CM

## 2014-03-08 DIAGNOSIS — R209 Unspecified disturbances of skin sensation: Secondary | ICD-10-CM

## 2014-03-08 DIAGNOSIS — IMO0001 Reserved for inherently not codable concepts without codable children: Secondary | ICD-10-CM

## 2014-03-08 DIAGNOSIS — R202 Paresthesia of skin: Principal | ICD-10-CM

## 2014-03-08 LAB — HEMOGLOBIN A1C: HEMOGLOBIN A1C: 6.4 % (ref 4.6–6.5)

## 2014-03-08 LAB — TSH: TSH: 1.7 u[IU]/mL (ref 0.35–4.50)

## 2014-03-08 LAB — BASIC METABOLIC PANEL
BUN: 8 mg/dL (ref 6–23)
CO2: 27 mEq/L (ref 19–32)
Calcium: 9.1 mg/dL (ref 8.4–10.5)
Chloride: 102 mEq/L (ref 96–112)
Creatinine, Ser: 0.8 mg/dL (ref 0.4–1.2)
GFR: 99.26 mL/min (ref >60.00)
Glucose, Bld: 100 mg/dL — ABNORMAL HIGH (ref 70–99)
POTASSIUM: 3.6 meq/L (ref 3.5–5.1)
SODIUM: 136 meq/L (ref 135–145)

## 2014-03-08 LAB — VITAMIN B12: VITAMIN B 12: 513 pg/mL (ref 211–911)

## 2014-03-08 NOTE — Progress Notes (Signed)
Pre visit review using our clinic review tool, if applicable. No additional management support is needed unless otherwise documented below in the visit note. 

## 2014-03-08 NOTE — Patient Instructions (Signed)
Your next office appointment will be determined based upon review of your pending labs. Those instructions will be transmitted to you through My Chart  OR  by mail.    Minimal Blood Pressure Goal= AVERAGE < 140/90;  Ideal is an AVERAGE < 135/85. This AVERAGE should be calculated from @ least 5-7 BP readings taken @ different times of day on different days of week. You should not respond to isolated BP readings , but rather the AVERAGE for that week .Please bring your  blood pressure cuff to office visits to verify that it is reliable.It  can also be checked against the blood pressure device at the pharmacy. Finger or wrist cuffs are not dependable; an arm cuff is. 

## 2014-03-08 NOTE — Progress Notes (Signed)
Subjective:    Patient ID: [name], female    DOB: 12-23-1975, 38 y.o.   MRN: [id number]  HPI   She describes tingling and numbness in the left leg from the anterior thigh down to involve the entire foot intermittently since the second or third week in June of this year. Additionally she had 2 episodes on the right as well. The episodes typically last minutes and are seemingly worse when walking or sitting.  The severity varies from 5-10.  She describes joint stiffness only in the left hip. With the episodes of tingling she does feel hot and sweaty. She describes cramping in the left thigh and describes weakness as if she cannot move the leg.  She describes symptoms as "numbness" but makes describes it further "as if it wants to hurt".  Past history is positive for hypertension and diabetes.  Review of chart reveals her last A1c was 6.6% on 02/12/13. TSH was therapeutic at 1.4. Her last lipids revealed mildly elevated LDL of 119. There was no anemia present. K+ 3.4; she D/Ced K+ after one pill.    Review of Systems   She denies any loss control of bladder or bowel.  No BP monitor @ home.     Objective:   Physical Exam Gen.: Healthy and well-nourished in appearance. As per CDC Guidelines ,Epic documents obesity as being present . Alert, appropriate and cooperative throughout exam. Appears younger than stated age  Head: Normocephalic without obvious abnormalities Eyes: No corneal or conjunctival inflammation noted. Pupils equal round reactive to light and accommodation. Extraocular motion intact.  Ears: External  ear exam reveals no significant lesions or deformities. Hearing is grossly normal bilaterally. Nose: External nasal exam reveals no deformity or inflammation. Nasal mucosa are pink and moist. No lesions or exudates noted.   Mouth: Oral mucosa and oropharynx reveal no lesions or exudates. Teeth in good repair. Neck: No deformities, masses, or tenderness noted. Range  of motion & Thyroid normal Lungs: Normal respiratory effort; chest expands symmetrically. Lungs are clear to auscultation without rales, wheezes, or increased work of breathing. Heart: Normal rate and rhythm. Normal S1 and S2. No gallop, click, or rub.No murmur. Abdomen: Bowel sounds normal; abdomen soft and nontender. No masses, organomegaly or hernias noted.                              Musculoskeletal/extremities: No deformity or scoliosis noted of  the thoracic or lumbar spine. No clubbing, cyanosis, edema, or significant extremity  deformity noted. Range of motion normal .Tone & strength normal. Hand joints normal.  Fingernail health good. Able to lie down & sit up w/o help. Negative SLR bilaterally Vascular: Carotid, radial artery, dorsalis pedis and  posterior tibial pulses are full and equal. No bruits present. Neurologic: Alert and oriented x3. Deep tendon reflexes symmetrical and normal.  Gait normal  including heel & toe walking . Rhomberg & finger to nose negative   Skin: Intact without suspicious lesions or rashes. Lymph: No cervical, axillary lymphadenopathy present. Psych: Mood and affect are normal. Normally interactive  Assessment & Plan:  #1 numbness & tingling LLE > RLE #2 HTN #3 DM #4 hypokalemia See orders

## 2014-03-09 DIAGNOSIS — E669 Obesity, unspecified: Secondary | ICD-10-CM | POA: Insufficient documentation

## 2014-03-09 LAB — RPR

## 2014-03-11 ENCOUNTER — Telehealth: Payer: Self-pay | Admitting: *Deleted

## 2014-03-11 NOTE — Telephone Encounter (Signed)
Left msg on triage requesting lab results.../lmb 

## 2014-03-12 NOTE — Telephone Encounter (Signed)
Called pt no answer LMOM md has release results to mychart...[name]

## 2014-09-27 ENCOUNTER — Other Ambulatory Visit: Payer: Self-pay | Admitting: Internal Medicine

## 2014-09-29 ENCOUNTER — Telehealth: Payer: Self-pay | Admitting: Internal Medicine

## 2014-09-29 DIAGNOSIS — I10 Essential (primary) hypertension: Secondary | ICD-10-CM

## 2014-09-29 NOTE — Telephone Encounter (Signed)
PA started via Cover my meds. RX Tribenzor 20-5-12.5MG  Tablets

## 2014-10-01 ENCOUNTER — Other Ambulatory Visit: Payer: Self-pay | Admitting: Internal Medicine

## 2014-10-06 NOTE — Telephone Encounter (Signed)
PA for Tribenzor has been denied.   Is there an alternative that the pt can try.

## 2014-10-07 ENCOUNTER — Other Ambulatory Visit: Payer: Self-pay | Admitting: Internal Medicine

## 2014-10-07 DIAGNOSIS — I10 Essential (primary) hypertension: Secondary | ICD-10-CM

## 2014-10-07 MED ORDER — AMLODIPINE BESYLATE 5 MG PO TABS: 5.0000 mg | ORAL_TABLET | Freq: Every day | ORAL | Status: DC

## 2014-10-07 MED ORDER — HYDROCHLOROTHIAZIDE 12.5 MG PO CAPS: 12.5000 mg | ORAL_CAPSULE | Freq: Every day | ORAL | Status: DC

## 2014-10-07 MED ORDER — LOSARTAN POTASSIUM 100 MG PO TABS: 100.0000 mg | ORAL_TABLET | Freq: Every day | ORAL | Status: DC

## 2014-10-07 NOTE — Telephone Encounter (Signed)
LVM for pt to call back.

## 2014-10-07 NOTE — Telephone Encounter (Signed)
The meds have been changed Please return soon for a BP check

## 2014-11-10 ENCOUNTER — Telehealth: Payer: Self-pay | Admitting: Internal Medicine

## 2014-11-10 NOTE — Telephone Encounter (Signed)
Pt called in and is confused on which BP meds she is suppose to be taking?  She is requesting a call back from the nurse

## 2014-11-11 NOTE — Telephone Encounter (Signed)
Spoke with patient and advised per previous phone note that insurance denied tribenzor so therefore MD changed to preferred. Patient also advised to schedule a follow up appt

## 2015-02-14 ENCOUNTER — Other Ambulatory Visit: Payer: Self-pay

## 2015-03-24 ENCOUNTER — Other Ambulatory Visit: Payer: Self-pay | Admitting: Internal Medicine

## 2015-03-26 ENCOUNTER — Telehealth: Payer: Self-pay | Admitting: Internal Medicine

## 2015-03-26 DIAGNOSIS — I10 Essential (primary) hypertension: Secondary | ICD-10-CM

## 2015-03-28 ENCOUNTER — Other Ambulatory Visit: Payer: Self-pay | Admitting: Internal Medicine

## 2015-03-28 MED ORDER — LOSARTAN POTASSIUM 100 MG PO TABS: 100.0000 mg | ORAL_TABLET | Freq: Every day | ORAL | Status: DC

## 2015-03-28 MED ORDER — AMLODIPINE BESYLATE 5 MG PO TABS: 5.0000 mg | ORAL_TABLET | Freq: Every day | ORAL | Status: DC

## 2015-03-28 MED ORDER — HYDROCHLOROTHIAZIDE 12.5 MG PO CAPS: 12.5000 mg | ORAL_CAPSULE | Freq: Every day | ORAL | Status: DC

## 2015-03-28 NOTE — Addendum Note (Signed)
Addended by: Estell Harpin T on: 03/28/2015 11:38 AM   Modules accepted: Orders

## 2015-03-28 NOTE — Telephone Encounter (Signed)
I scheduled patient an appointment for Monday 8/15 and she has been out of her medications for about a week now.  Can you please send her prescriptions in to [facility] on [address] to get her until then.

## 2015-03-28 NOTE — Telephone Encounter (Signed)
Two week supply called in.

## 2015-04-04 ENCOUNTER — Ambulatory Visit: Payer: Self-pay | Admitting: Internal Medicine

## 2015-04-08 ENCOUNTER — Telehealth: Payer: Self-pay | Admitting: Internal Medicine

## 2015-04-08 NOTE — Telephone Encounter (Signed)
Patient no showed for med fu on 8/15.  Please advise.

## 2015-04-09 NOTE — Telephone Encounter (Signed)
Please reschedule her

## 2015-04-11 NOTE — Telephone Encounter (Signed)
Left patient vm to call back to reschedule if she would like

## 2015-06-06 ENCOUNTER — Encounter: Payer: Self-pay | Admitting: Internal Medicine

## 2015-06-06 ENCOUNTER — Other Ambulatory Visit (INDEPENDENT_AMBULATORY_CARE_PROVIDER_SITE_OTHER): Payer: 59

## 2015-06-06 ENCOUNTER — Ambulatory Visit (INDEPENDENT_AMBULATORY_CARE_PROVIDER_SITE_OTHER): Payer: 59 | Admitting: Internal Medicine

## 2015-06-06 VITALS — BP 168/120 | HR 85 | Temp 98.3°F | Resp 16 | Ht 65.0 in | Wt 213.0 lb

## 2015-06-06 DIAGNOSIS — I10 Essential (primary) hypertension: Secondary | ICD-10-CM | POA: Diagnosis not present

## 2015-06-06 DIAGNOSIS — E118 Type 2 diabetes mellitus with unspecified complications: Secondary | ICD-10-CM

## 2015-06-06 DIAGNOSIS — E876 Hypokalemia: Secondary | ICD-10-CM

## 2015-06-06 DIAGNOSIS — R0602 Shortness of breath: Secondary | ICD-10-CM

## 2015-06-06 LAB — TSH: TSH: 1.26 u[IU]/mL (ref 0.35–4.50)

## 2015-06-06 LAB — LIPID PANEL
CHOL/HDL RATIO: 3
Cholesterol: 192 mg/dL (ref 0–200)
HDL: 71.5 mg/dL (ref >39.00)
LDL Cholesterol: 108 mg/dL — ABNORMAL HIGH (ref 0–99)
NONHDL: 120.8
Triglycerides: 62 mg/dL (ref 0.0–149.0)
VLDL: 12.4 mg/dL (ref 0.0–40.0)

## 2015-06-06 LAB — URINALYSIS, ROUTINE W REFLEX MICROSCOPIC
Bilirubin Urine: NEGATIVE
Hgb urine dipstick: NEGATIVE
KETONES UR: NEGATIVE
Leukocytes, UA: NEGATIVE
Nitrite: NEGATIVE
PH: 7.5 (ref 5.0–8.0)
SPECIFIC GRAVITY, URINE: 1.015 (ref 1.000–1.030)
Total Protein, Urine: NEGATIVE
Urine Glucose: NEGATIVE
Urobilinogen, UA: 0.2 (ref 0.0–1.0)

## 2015-06-06 LAB — CBC WITH DIFFERENTIAL/PLATELET
BASOS PCT: 0.1 % (ref 0.0–3.0)
Basophils Absolute: 0 10*3/uL (ref 0.0–0.1)
EOS PCT: 9.4 % — AB (ref 0.0–5.0)
Eosinophils Absolute: 0.7 10*3/uL (ref 0.0–0.7)
HCT: 40.4 % (ref 36.0–46.0)
Hemoglobin: 13.2 g/dL (ref 12.0–15.0)
LYMPHS ABS: 3 10*3/uL (ref 0.7–4.0)
Lymphocytes Relative: 42.1 % (ref 12.0–46.0)
MCHC: 32.7 g/dL (ref 30.0–36.0)
MCV: 75.9 fl — AB (ref 78.0–100.0)
MONOS PCT: 8.2 % (ref 3.0–12.0)
Monocytes Absolute: 0.6 10*3/uL (ref 0.1–1.0)
NEUTROS ABS: 2.9 10*3/uL (ref 1.4–7.7)
NEUTROS PCT: 40.2 % — AB (ref 43.0–77.0)
Platelets: 248 10*3/uL (ref 150.0–400.0)
RBC: 5.32 Mil/uL — AB (ref 3.87–5.11)
RDW: 16.4 % — ABNORMAL HIGH (ref 11.5–15.5)
WBC: 7.2 10*3/uL (ref 4.0–10.5)

## 2015-06-06 LAB — COMPREHENSIVE METABOLIC PANEL
ALK PHOS: 63 U/L (ref 39–117)
ALT: 14 U/L (ref 0–35)
AST: 16 U/L (ref 0–37)
Albumin: 3.7 g/dL (ref 3.5–5.2)
BUN: 8 mg/dL (ref 6–23)
CHLORIDE: 103 meq/L (ref 96–112)
CO2: 29 meq/L (ref 19–32)
Calcium: 9 mg/dL (ref 8.4–10.5)
Creatinine, Ser: 0.86 mg/dL (ref 0.40–1.20)
GFR: 93.37 mL/min (ref >60.00)
GLUCOSE: 95 mg/dL (ref 70–99)
POTASSIUM: 4.1 meq/L (ref 3.5–5.1)
Sodium: 138 mEq/L (ref 135–145)
Total Bilirubin: 0.4 mg/dL (ref 0.2–1.2)
Total Protein: 7.4 g/dL (ref 6.0–8.3)

## 2015-06-06 LAB — MICROALBUMIN / CREATININE URINE RATIO
CREATININE, U: 70.5 mg/dL
MICROALB UR: 4.6 mg/dL — AB (ref 0.0–1.9)
MICROALB/CREAT RATIO: 6.5 mg/g (ref 0.0–30.0)

## 2015-06-06 LAB — HEMOGLOBIN A1C: HEMOGLOBIN A1C: 6 % (ref 4.6–6.5)

## 2015-06-06 MED ORDER — HYDROCHLOROTHIAZIDE 12.5 MG PO CAPS: 12.5000 mg | ORAL_CAPSULE | Freq: Every day | ORAL | Status: DC

## 2015-06-06 MED ORDER — AMLODIPINE BESYLATE 5 MG PO TABS: 5.0000 mg | ORAL_TABLET | Freq: Every day | ORAL | Status: DC

## 2015-06-06 MED ORDER — LOSARTAN POTASSIUM 100 MG PO TABS: 100.0000 mg | ORAL_TABLET | Freq: Every day | ORAL | Status: DC

## 2015-06-06 NOTE — Patient Instructions (Signed)
Hypertension Hypertension, commonly called high blood pressure, is when the force of blood pumping through your arteries is too strong. Your arteries are the blood vessels that carry blood from your heart throughout your body. A blood pressure reading consists of a higher number over a lower number, such as 110/72. The higher number (systolic) is the pressure inside your arteries when your heart pumps. The lower number (diastolic) is the pressure inside your arteries when your heart relaxes. Ideally you want your blood pressure below 120/80. Hypertension forces your heart to work harder to pump blood. Your arteries may become narrow or stiff. Having untreated or uncontrolled hypertension can cause heart attack, stroke, kidney disease, and other problems. RISK FACTORS Some risk factors for high blood pressure are controllable. Others are not.  Risk factors you cannot control include:   Race. You may be at higher risk if you are African American.  Age. Risk increases with age.  Gender. Men are at higher risk than women before age 45 years. After age 65, women are at higher risk than men. Risk factors you can control include:  Not getting enough exercise or physical activity.  Being overweight.  Getting too much fat, sugar, calories, or salt in your diet.  Drinking too much alcohol. SIGNS AND SYMPTOMS Hypertension does not usually cause signs or symptoms. Extremely high blood pressure (hypertensive crisis) may cause headache, anxiety, shortness of breath, and nosebleed. DIAGNOSIS To check if you have hypertension, your health care provider will measure your blood pressure while you are seated, with your arm held at the level of your heart. It should be measured at least twice using the same arm. Certain conditions can cause a difference in blood pressure between your right and left arms. A blood pressure reading that is higher than normal on one occasion does not mean that you need treatment. If  it is not clear whether you have high blood pressure, you may be asked to return on a different day to have your blood pressure checked again. Or, you may be asked to monitor your blood pressure at home for 1 or more weeks. TREATMENT Treating high blood pressure includes making lifestyle changes and possibly taking medicine. Living a healthy lifestyle can help lower high blood pressure. You may need to change some of your habits. Lifestyle changes may include:  Following the DASH diet. This diet is high in fruits, vegetables, and whole grains. It is low in salt, red meat, and added sugars.  Keep your sodium intake below 2,300 mg per day.  Getting at least 30-45 minutes of aerobic exercise at least 4 times per week.  Losing weight if necessary.  Not smoking.  Limiting alcoholic beverages.  Learning ways to reduce stress. Your health care provider may prescribe medicine if lifestyle changes are not enough to get your blood pressure under control, and if one of the following is true:  You are 18-59 years of age and your systolic blood pressure is above 140.  You are 60 years of age or older, and your systolic blood pressure is above 150.  Your diastolic blood pressure is above 90.  You have diabetes, and your systolic blood pressure is over 140 or your diastolic blood pressure is over 90.  You have kidney disease and your blood pressure is above 140/90.  You have heart disease and your blood pressure is above 140/90. Your personal target blood pressure may vary depending on your medical conditions, your age, and other factors. HOME CARE INSTRUCTIONS    Have your blood pressure rechecked as directed by your health care provider.   Take medicines only as directed by your health care provider. Follow the directions carefully. Blood pressure medicines must be taken as prescribed. The medicine does not work as well when you skip doses. Skipping doses also puts you at risk for  problems.  Do not smoke.   Monitor your blood pressure at home as directed by your health care provider. SEEK MEDICAL CARE IF:   You think you are having a reaction to medicines taken.  You have recurrent headaches or feel dizzy.  You have swelling in your ankles.  You have trouble with your vision. SEEK IMMEDIATE MEDICAL CARE IF:  You develop a severe headache or confusion.  You have unusual weakness, numbness, or feel faint.  You have severe chest or abdominal pain.  You vomit repeatedly.  You have trouble breathing. MAKE SURE YOU:   Understand these instructions.  Will watch your condition.  Will get help right away if you are not doing well or get worse.   This information is not intended to replace advice given to you by your health care provider. Make sure you discuss any questions you have with your health care provider.   Document Released: 08/06/2005 Document Revised: 12/21/2014 Document Reviewed: 05/29/2013 Elsevier Interactive Patient Education 2016 [employer]  

## 2015-06-06 NOTE — Progress Notes (Signed)
Subjective:  Patient ID: [name], female    DOB: [birth year]  Age: 39 y.o. MRN: [id number]  CC: Hypertension   HPI [name] presents for a BP check - she has not taken any meds for over 4 weeks therefore her BP has not been controlled. She had a brief episode of HA and SOB 2 weeks ago but she has felt well since then. She offers no complaints today.  Outpatient Prescriptions Prior to Visit  Medication Sig Dispense Refill  . levonorgestrel (MIRENA) 20 MCG/24HR IUD 1 Intra Uterine Device (1 each total) by Intrauterine route once. 1 each 0  . amLODipine (NORVASC) 5 MG tablet Take 1 tablet (5 mg total) by mouth daily. 14 tablet 0  . hydrochlorothiazide (MICROZIDE) 12.5 MG capsule Take 1 capsule (12.5 mg total) by mouth daily. 14 capsule 0  . losartan (COZAAR) 100 MG tablet Take 1 tablet (100 mg total) by mouth daily. 14 tablet 0   No facility-administered medications prior to visit.    ROS Review of Systems  Constitutional: Negative.  Negative for fever, chills, diaphoresis, appetite change and fatigue.  HENT: Negative.   Eyes: Negative.  Negative for photophobia and visual disturbance.  Respiratory: Negative.  Negative for cough, choking, chest tightness, shortness of breath and stridor.   Cardiovascular: Negative.  Negative for chest pain, palpitations and leg swelling.  Gastrointestinal: Negative.  Negative for nausea, vomiting, abdominal pain, diarrhea and constipation.  Endocrine: Negative.   Genitourinary: Negative.  Negative for dysuria, hematuria and difficulty urinating.  Musculoskeletal: Negative.  Negative for back pain and neck pain.  Skin: Negative.   Allergic/Immunologic: Negative.   Neurological: Negative.  Negative for dizziness, syncope, facial asymmetry, speech difficulty, weakness, light-headedness and headaches.  Hematological: Negative.  Negative for adenopathy. Does not bruise/bleed easily.  Psychiatric/Behavioral: Negative.     Objective:  BP  168/120 mmHg  Pulse 85  Temp(Src) 98.3 F (36.8 C) (Oral)  Resp 16  Ht 5\' 5"  (1.651 m)  Wt 213 lb (96.616 kg)  BMI 35.44 kg/m2  SpO2 98%  LMP 05/16/2015  BP Readings from Last 3 Encounters:  06/06/15 168/120  03/08/14 128/98  02/12/13 190/120    Wt Readings from Last 3 Encounters:  06/06/15 213 lb (96.616 kg)  03/08/14 214 lb 3.2 oz (97.16 kg)  02/12/13 217 lb (98.431 kg)    Physical Exam  Constitutional: She is oriented to person, place, and time. No distress.  HENT:  Head: Normocephalic and atraumatic.  Mouth/Throat: Oropharynx is clear and moist. No oropharyngeal exudate.  Eyes: Conjunctivae are normal. Right eye exhibits no discharge. Left eye exhibits no discharge. No scleral icterus.  Neck: Normal range of motion. Neck supple. No JVD present. No tracheal deviation present. No thyromegaly present.  Cardiovascular: Normal rate, regular rhythm, normal heart sounds and intact distal pulses.  Exam reveals no gallop and no friction rub.   No murmur heard. EKG - NSR, no LVH, there are diffuse NS ST/T wave changes unchanged from EKG done 3 years ago  Pulmonary/Chest: Effort normal and breath sounds normal. No stridor. No respiratory distress. She has no wheezes. She has no rales. She exhibits no tenderness.  Abdominal: Soft. Bowel sounds are normal. She exhibits no distension and no mass. There is no tenderness. There is no rebound and no guarding.  Musculoskeletal: Normal range of motion. She exhibits no edema or tenderness.  Lymphadenopathy:    She has no cervical adenopathy.  Neurological: She is oriented to person, place, and time.  Skin: Skin is warm and dry. No rash noted. She is not diaphoretic. No erythema. No pallor.  Psychiatric: She has a normal mood and affect. Her behavior is normal. Judgment and thought content normal.  Vitals reviewed.   Lab Results  Component Value Date   WBC 7.2 06/06/2015   HGB 13.2 06/06/2015   HCT 40.4 06/06/2015   PLT 248.0  06/06/2015   GLUCOSE 95 06/06/2015   CHOL 192 06/06/2015   TRIG 62.0 06/06/2015   HDL 71.50 06/06/2015   LDLCALC 108* 06/06/2015   ALT 14 06/06/2015   AST 16 06/06/2015   NA 138 06/06/2015   K 4.1 06/06/2015   CL 103 06/06/2015   CREATININE 0.86 06/06/2015   BUN 8 06/06/2015   CO2 29 06/06/2015   TSH 1.26 06/06/2015   INR 1.1 09/14/2008   HGBA1C 6.0 06/06/2015   MICROALBUR 4.6* 06/06/2015    Dg Chest 2 View  03/03/2012  *RADIOLOGY REPORT* Clinical Data: Cough, shortness of breath and wheezing.  Upper back pain. CHEST - 2 VIEW Comparison: CT chest and chest radiograph 04/26/2009. Findings: Trachea is midline.  Heart size normal.  Lungs are somewhat low in volume but clear.  No pleural fluid. IMPRESSION: No acute findings. Original Report Authenticated By: Luretha Rued, M.D.  Ct Angio Chest W/cm &/or Wo Cm  03/03/2012  *RADIOLOGY REPORT* Clinical Data: Chest pain, shortness of breath, and cough. CT ANGIOGRAPHY CHEST Technique:  Multidetector CT imaging of the chest using the standard protocol during bolus administration of intravenous contrast. Multiplanar reconstructed images including MIPs were obtained and reviewed to evaluate the vascular anatomy. Contrast: 163mL OMNIPAQUE IOHEXOL 350 MG/ML SOLN Comparison: Chest x-ray dated 03/03/2012 and chest CT dated 04/26/2009 Findings: There are no pulmonary emboli, infiltrates, effusions, mass lesions, or other significant abnormalities.  Heart size is normal.  No hilar or mediastinal adenopathy.  Osseous structures are normal.  Visualized portion of the upper abdomen is normal. IMPRESSION: Normal exam. Original Report Authenticated By: Larey Seat, M.D.   Assessment & Plan:   [name] was seen today for hypertension.  Diagnoses and all orders for this visit:  Essential hypertension- her BP is not well controlled, I will restart the prior meds, her labs and EKG do not show any secondary causes for an elevated BP or end organ  damage -     losartan (COZAAR) 100 MG tablet; Take 1 tablet (100 mg total) by mouth daily. -     hydrochlorothiazide (MICROZIDE) 12.5 MG capsule; Take 1 capsule (12.5 mg total) by mouth daily. -     amLODipine (NORVASC) 5 MG tablet; Take 1 tablet (5 mg total) by mouth daily. -     TSH; Future -     Urinalysis, Routine w reflex microscopic (not at [facility]); Future -     Comprehensive metabolic panel; Future -     CBC with Differential/Platelet; Future  SOB (shortness of breath) on exertion- EKG is unchanged and this SOB has resolved, she will let me know if her symptoms return -     EKG 12-Lead  Type 2 diabetes mellitus with complication, without long-term current use of insulin ([place])- her blood sugars are well controlled -     Lipid panel; Future -     Microalbumin / creatinine urine ratio; Future -     Hemoglobin A1c; Future -     Comprehensive metabolic panel; Future  Hypokalemia- her K+ level is normal -     Comprehensive metabolic panel; Future  Other  orders -     Cancel: amLODipine (NORVASC) 5 MG tablet; Take 1 tablet (5 mg total) by mouth daily. -     Cancel: hydrochlorothiazide (MICROZIDE) 12.5 MG capsule; Take 1 capsule (12.5 mg total) by mouth daily. -     Cancel: losartan (COZAAR) 100 MG tablet; Take 1 tablet (100 mg total) by mouth daily.   I am having [name] maintain her levonorgestrel, losartan, hydrochlorothiazide, and amLODipine.  Meds ordered this encounter  Medications  . losartan (COZAAR) 100 MG tablet    Sig: Take 1 tablet (100 mg total) by mouth daily.    Dispense:  30 tablet    Refill:  5  . hydrochlorothiazide (MICROZIDE) 12.5 MG capsule    Sig: Take 1 capsule (12.5 mg total) by mouth daily.    Dispense:  30 capsule    Refill:  5  . amLODipine (NORVASC) 5 MG tablet    Sig: Take 1 tablet (5 mg total) by mouth daily.    Dispense:  30 tablet    Refill:  5     Follow-up: Return in about 6 weeks (around 07/18/2015).  Scarlette Calico, MD

## 2015-06-06 NOTE — Progress Notes (Signed)
Pre visit review using our clinic review tool, if applicable. No additional management support is needed unless otherwise documented below in the visit note. 

## 2015-06-07 NOTE — Addendum Note (Signed)
Addended by: Janith Lima on: 06/07/2015 07:30 AM   Modules accepted: Miquel Dunn

## 2016-01-28 ENCOUNTER — Other Ambulatory Visit: Payer: Self-pay | Admitting: Internal Medicine

## 2016-05-09 ENCOUNTER — Other Ambulatory Visit: Payer: Self-pay | Admitting: Internal Medicine

## 2016-08-16 ENCOUNTER — Other Ambulatory Visit (INDEPENDENT_AMBULATORY_CARE_PROVIDER_SITE_OTHER): Payer: 59

## 2016-08-16 ENCOUNTER — Ambulatory Visit (INDEPENDENT_AMBULATORY_CARE_PROVIDER_SITE_OTHER): Payer: 59 | Admitting: Nurse Practitioner

## 2016-08-16 ENCOUNTER — Encounter: Payer: Self-pay | Admitting: Nurse Practitioner

## 2016-08-16 VITALS — BP 172/148 | HR 104 | Temp 98.5°F | Ht 65.0 in | Wt 223.0 lb

## 2016-08-16 DIAGNOSIS — I10 Essential (primary) hypertension: Secondary | ICD-10-CM | POA: Diagnosis not present

## 2016-08-16 LAB — BASIC METABOLIC PANEL
BUN: 12 mg/dL (ref 6–23)
CHLORIDE: 102 meq/L (ref 96–112)
CO2: 34 meq/L — AB (ref 19–32)
CREATININE: 0.94 mg/dL (ref 0.40–1.20)
Calcium: 9.4 mg/dL (ref 8.4–10.5)
GFR: 83.77 mL/min (ref >60.00)
Glucose, Bld: 112 mg/dL — ABNORMAL HIGH (ref 70–99)
POTASSIUM: 4.2 meq/L (ref 3.5–5.1)
Sodium: 138 mEq/L (ref 135–145)

## 2016-08-16 MED ORDER — CLONIDINE HCL 0.1 MG PO TABS
0.2000 mg | ORAL_TABLET | Freq: Once | ORAL | Status: AC
  Administered 2016-08-16: 0.2 mg via ORAL

## 2016-08-16 MED ORDER — LOSARTAN POTASSIUM-HCTZ 100-25 MG PO TABS: 1.0000 | ORAL_TABLET | Freq: Every day | ORAL | 1 refills | Status: DC

## 2016-08-16 MED ORDER — METOPROLOL SUCCINATE ER 50 MG PO TB24: 50.0000 mg | ORAL_TABLET | Freq: Every day | ORAL | 1 refills | Status: DC

## 2016-08-16 MED ORDER — LOSARTAN POTASSIUM 100 MG PO TABS: 100.0000 mg | ORAL_TABLET | Freq: Every day | ORAL | 0 refills | Status: DC

## 2016-08-16 MED ORDER — HYDROCHLOROTHIAZIDE 12.5 MG PO CAPS: 12.5000 mg | ORAL_CAPSULE | Freq: Every day | ORAL | 0 refills | Status: DC

## 2016-08-16 MED ORDER — AMLODIPINE BESYLATE 5 MG PO TABS: 5.0000 mg | ORAL_TABLET | Freq: Every day | ORAL | 0 refills | Status: DC

## 2016-08-16 NOTE — Progress Notes (Signed)
Pre visit review using our clinic review tool, if applicable. No additional management support is needed unless otherwise documented below in the visit note. 

## 2016-08-16 NOTE — Patient Instructions (Addendum)
Go to basement for blood draw.  Maintain upcoming appt with Dr. Ronnald Ramp. Take BP medications as prescribed.  Check BP at least 3times a week and record. Bring BP reading to next office visit.  Return to office tomorrow for BP check by nurse. Start BP medications today. Take BP medication 1hr prior to tomorrow's office visit.   Managing Your Hypertension Hypertension is commonly called high blood pressure. Blood pressure is a measurement of how strongly your blood is pressing against the walls of your arteries. Arteries are blood vessels that carry blood from your heart throughout your body. Blood pressure does not stay the same. It rises when you are active, excited, or nervous. It lowers when you are sleeping or relaxed. If the numbers that measure your blood pressure stay above normal most of the time, you are at risk for health problems. Hypertension is a long-term (chronic) condition in which blood pressure is elevated. This condition often has no signs or symptoms. The cause of the condition is usually not known. What are blood pressure readings? A blood pressure reading is recorded as two numbers, such as "120 over 80" (or 120/80). The first ("top") number is called the systolic pressure. It is a measure of the pressure in your arteries as the heart beats. The second ("bottom") number is called the diastolic pressure. It is a measure of the pressure in your arteries as the heart relaxes between beats. What does my blood pressure reading mean? Blood pressure is classified into four stages. Based on your blood pressure reading, your health care provider may use the following stages to determine what type of treatment, if any, is needed. Systolic pressure and diastolic pressure are measured in a unit called mm Hg. Normal  Systolic pressure: below 123456.  Diastolic pressure: below 80. Prehypertension  Systolic pressure: 123456.  Diastolic pressure: [id number]. Hypertension stage 1  Systolic  pressure: A999333.  Diastolic pressure: A999333. Hypertension stage 2  Systolic pressure: 0000000 or above.  Diastolic pressure: 123XX123 or above. What health risks are associated with hypertension? Managing your hypertension is an important responsibility. Uncontrolled hypertension can lead to:  A heart attack.  A stroke.  A weakened blood vessel (aneurysm).  Heart failure.  Kidney damage.  Eye damage.  Metabolic syndrome.  Memory and concentration problems. What changes can I make to manage my hypertension? Hypertension can be managed effectively by making lifestyle changes and possibly by taking medicines. Your health care provider will help you come up with a plan to bring your blood pressure within a normal range. Your plan should include the following: Monitoring  Monitor your blood pressure at home as told by your health care provider. Your personal target blood pressure may vary depending on your medical conditions, your age, and other factors.  Have your blood pressure rechecked as told by your health care provider. Lifestyle  Lose weight if necessary.  Get at least 30-45 minutes of aerobic exercise at least 4 times a week.  Do not use any products that contain nicotine or tobacco, such as cigarettes and e-cigarettes. If you need help quitting, ask your health care provider.  Learn ways to reduce stress.  Control any chronic conditions, such as high cholesterol or diabetes. Eating and drinking  Follow the DASH diet. This diet is high in fruits, vegetables, and whole grains. It is low in salt, red meat, and added sugars.  Keep your sodium intake below 2,300 mg per day.  Limit alcoholic beverages. Communication  Review all the medicines  you take with your health care provider because there may be side effects or interactions.  Talk with your health care provider about your diet, exercise habits, and other lifestyle factors that may be contributing to  hypertension.  See your health care provider regularly. Your health care provider can help you create and adjust your plan for managing hypertension. Will I need medicine to control my blood pressure? Your health care provider may prescribe medicine if lifestyle changes are not enough to get your blood pressure under control, and if one of the following is true:  You are 46-10 years of age, and your systolic blood pressure is 140 or higher.  You are 32 years of age or older, and your systolic blood pressure is 150 or higher.  Your diastolic blood pressure is 90 or higher.  You have diabetes, and your systolic blood pressure is over [id number] or your diastolic blood pressure is over 90.  You have kidney disease, and your blood pressure is above 140/90.  You have heart disease or a history of stroke, and your blood pressure is 140/90 or higher. Take medicines only as told by your health care provider. Follow the directions carefully. Blood pressure medicines must be taken as prescribed. The medicine does not work as well when you skip doses. Skipping doses also puts you at risk for problems. Contact a health care provider if:  You think you are having a reaction to medicines you have taken.  You have repeated (recurrent) headaches.  You feel dizzy.  You have swelling in your ankles.  You have trouble with your vision. Get help right away if:  You develop a severe headache or confusion.  You have unusual weakness or numbness, or you feel faint.  You have severe pain in your chest or abdomen.  You vomit repeatedly.  You have trouble breathing. This information is not intended to replace advice given to you by your health care provider. Make sure you discuss any questions you have with your health care provider. Document Released: 04/30/2012 Document Revised: 04/10/2016 Document Reviewed: 11/04/2015 Elsevier Interactive Patient Education  2017 [employer].

## 2016-08-16 NOTE — Progress Notes (Signed)
Subjective:  Patient ID: [name], female    DOB: 1975/08/24  Age: 40 y.o. MRN: [id number]  CC: Headache (headache/SOB/ not take BP med for 3 wks. )   Hypertension  This is a chronic problem. The current episode started more than 1 year ago. The problem has been gradually worsening since onset. The problem is uncontrolled. Associated symptoms include headaches, malaise/fatigue and shortness of breath. Pertinent negatives include no anxiety, blurred vision, chest pain, neck pain, orthopnea, palpitations, peripheral edema, PND or sweats. There are no associated agents to hypertension. Risk factors for coronary artery disease include family history, obesity and sedentary lifestyle. Past treatments include ACE inhibitors, diuretics and calcium channel blockers. The current treatment provides significant improvement. Compliance problems include medication cost and psychosocial issues.     Outpatient Medications Prior to Visit  Medication Sig Dispense Refill  . levonorgestrel (MIRENA) 20 MCG/24HR IUD 1 Intra Uterine Device (1 each total) by Intrauterine route once. 1 each 0  . amLODipine (NORVASC) 5 MG tablet Take 1 tablet (5 mg total) by mouth daily. Yearly physical is due in Oct must see MD for future refills 30 tablet 0  . hydrochlorothiazide (MICROZIDE) 12.5 MG capsule Take 1 capsule (12.5 mg total) by mouth daily. Yearly physical is due in Oct must see MD for refills 30 capsule 0  . losartan (COZAAR) 100 MG tablet Take 1 tablet (100 mg total) by mouth daily. Yearly physical is due in Oct must see MD for refills 30 tablet 0   No facility-administered medications prior to visit.     ROS See HPI  Objective:  BP (!) 172/148   Pulse (!) 104   Temp 98.5 F (36.9 C)   Ht 5\' 5"  (1.651 m)   Wt 223 lb (101.2 kg)   SpO2 98%   BMI 37.11 kg/m   BP Readings from Last 3 Encounters:  08/17/16 (!) 160/118  08/16/16 (!) 172/148  06/06/15 (!) 168/120    Wt Readings from Last 3  Encounters:  08/16/16 223 lb (101.2 kg)  06/06/15 213 lb (96.6 kg)  03/08/14 214 lb 3.2 oz (97.2 kg)    Physical Exam  Constitutional: She is oriented to person, place, and time. No distress.  Eyes: No scleral icterus.  Neck: Normal range of motion. Neck supple.  Cardiovascular: Normal rate and regular rhythm.  Exam reveals gallop.   Pulmonary/Chest: Effort normal and breath sounds normal.  Musculoskeletal: She exhibits no edema.  Neurological: She is alert and oriented to person, place, and time.  Vitals reviewed.   Lab Results  Component Value Date   WBC 7.2 06/06/2015   HGB 13.2 06/06/2015   HCT 40.4 06/06/2015   PLT 248.0 06/06/2015   GLUCOSE 112 (H) 08/16/2016   CHOL 192 06/06/2015   TRIG 62.0 06/06/2015   HDL 71.50 06/06/2015   LDLCALC 108 (H) 06/06/2015   ALT 14 06/06/2015   AST 16 06/06/2015   NA 138 08/16/2016   K 4.2 08/16/2016   CL 102 08/16/2016   CREATININE 0.94 08/16/2016   BUN 12 08/16/2016   CO2 34 (H) 08/16/2016   TSH 1.26 06/06/2015   INR 1.1 09/14/2008   HGBA1C 6.0 06/06/2015   MICROALBUR 4.6 (H) 06/06/2015    Dg Chest 2 View  Result Date: 03/03/2012 *RADIOLOGY REPORT* Clinical Data: Cough, shortness of breath and wheezing.  Upper back pain. CHEST - 2 VIEW Comparison: CT chest and chest radiograph 04/26/2009. Findings: Trachea is midline.  Heart size normal.  Lungs are  somewhat low in volume but clear.  No pleural fluid. IMPRESSION: No acute findings. Original Report Authenticated By: Luretha Rued, M.D.  Ct Angio Chest W/cm &/or Wo Cm  Result Date: 03/03/2012 *RADIOLOGY REPORT* Clinical Data: Chest pain, shortness of breath, and cough. CT ANGIOGRAPHY CHEST Technique:  Multidetector CT imaging of the chest using the standard protocol during bolus administration of intravenous contrast. Multiplanar reconstructed images including MIPs were obtained and reviewed to evaluate the vascular anatomy. Contrast: 180mL OMNIPAQUE IOHEXOL 350 MG/ML SOLN  Comparison: Chest x-ray dated 03/03/2012 and chest CT dated 04/26/2009 Findings: There are no pulmonary emboli, infiltrates, effusions, mass lesions, or other significant abnormalities.  Heart size is normal.  No hilar or mediastinal adenopathy.  Osseous structures are normal.  Visualized portion of the upper abdomen is normal. IMPRESSION: Normal exam. Original Report Authenticated By: Larey Seat, M.D.   Assessment & Plan:   [name] was seen today for headache.  Diagnoses and all orders for this visit:  Essential hypertension -     cloNIDine (CATAPRES) tablet 0.2 mg; Take 2 tablets (0.2 mg total) by mouth once. -     Discontinue: losartan (COZAAR) 100 MG tablet; Take 1 tablet (100 mg total) by mouth daily. Yearly physical is due in Oct must see MD for refills -     Discontinue: hydrochlorothiazide (MICROZIDE) 12.5 MG capsule; Take 1 capsule (12.5 mg total) by mouth daily. Yearly physical is due in Oct must see MD for refills -     Discontinue: amLODipine (NORVASC) 5 MG tablet; Take 1 tablet (5 mg total) by mouth daily. Yearly physical is due in Oct must see MD for future refills -     Basic metabolic panel; Future -     losartan-hydrochlorothiazide (HYZAAR) 100-25 MG tablet; Take 1 tablet by mouth daily. -     metoprolol succinate (TOPROL-XL) 50 MG 24 hr tablet; Take 1 tablet (50 mg total) by mouth daily. Take with or immediately following a meal.   I have discontinued [name]'s amLODipine, hydrochlorothiazide, losartan, losartan, hydrochlorothiazide, and amLODipine. I am also having her start on losartan-hydrochlorothiazide and metoprolol succinate. Additionally, I am having her maintain her levonorgestrel. We administered cloNIDine.  Meds ordered this encounter  Medications  . cloNIDine (CATAPRES) tablet 0.2 mg  . DISCONTD: losartan (COZAAR) 100 MG tablet    Sig: Take 1 tablet (100 mg total) by mouth daily. Yearly physical is due in Oct must see MD for refills    Dispense:  30  tablet    Refill:  0    Order Specific Question:   Supervising Provider    Answer:   Cassandria Anger [[id number]]  . DISCONTD: hydrochlorothiazide (MICROZIDE) 12.5 MG capsule    Sig: Take 1 capsule (12.5 mg total) by mouth daily. Yearly physical is due in Oct must see MD for refills    Dispense:  30 capsule    Refill:  0    Order Specific Question:   Supervising Provider    Answer:   Cassandria Anger [[id number]]  . DISCONTD: amLODipine (NORVASC) 5 MG tablet    Sig: Take 1 tablet (5 mg total) by mouth daily. Yearly physical is due in Oct must see MD for future refills    Dispense:  30 tablet    Refill:  0    Order Specific Question:   Supervising Provider    Answer:   Cassandria Anger [[id number]]  . losartan-hydrochlorothiazide (HYZAAR) 100-25 MG tablet    Sig: Take 1 tablet  by mouth daily.    Dispense:  30 tablet    Refill:  1    Order Specific Question:   Supervising Provider    Answer:   Cassandria Anger [[id number]]  . metoprolol succinate (TOPROL-XL) 50 MG 24 hr tablet    Sig: Take 1 tablet (50 mg total) by mouth daily. Take with or immediately following a meal.    Dispense:  30 tablet    Refill:  1    Order Specific Question:   Supervising Provider    Answer:   Cassandria Anger [[id number]]    Follow-up: No Follow-up on file.  Wilfred Lacy, NP

## 2016-08-17 ENCOUNTER — Ambulatory Visit: Payer: 59

## 2016-08-17 ENCOUNTER — Telehealth: Payer: Self-pay

## 2016-08-17 VITALS — BP 160/118

## 2016-08-17 DIAGNOSIS — I10 Essential (primary) hypertension: Secondary | ICD-10-CM

## 2016-08-17 NOTE — Telephone Encounter (Signed)
Continue BP medication as prescribed. Return to office in 1week for HTN follow up. Go to hospital if develops chest pain, headache, and blurry vision, or any signs of stroke. Thank you

## 2016-08-17 NOTE — Telephone Encounter (Signed)
Patient came in for nurse visit to have bp rechecked---saw [place], Np in office visit yesterday----today's reading is 160/118---routing to [place]---please advise if you need further adjustment to med(s)---I will call patient back, thanks

## 2016-08-21 NOTE — Telephone Encounter (Signed)
Left message advising patient to call back to schedule nurse visit either for Friday of this week (1/5) or next Monday (1/8) to have bp rechecked----let charlotte know what reading is after patient returns on nurse visit

## 2016-08-23 ENCOUNTER — Encounter: Payer: Self-pay | Admitting: Nurse Practitioner

## 2016-08-27 ENCOUNTER — Telehealth: Payer: Self-pay

## 2016-08-27 ENCOUNTER — Ambulatory Visit: Payer: 59

## 2016-08-27 VITALS — BP 142/110

## 2016-08-27 DIAGNOSIS — I10 Essential (primary) hypertension: Secondary | ICD-10-CM

## 2016-08-27 NOTE — Telephone Encounter (Signed)
Patient came in for bp recheck today---reading 142/110, much improved---however, still a little high---patient reports feeling better, no headache---routing to [place]---do you want to make any further adjustments to meds----please advise, I will call patient back, thanks

## 2016-08-27 NOTE — Telephone Encounter (Signed)
Left message advising patient of charlottes note/instructions---call back if any questions, or if patient becomes symptomatic again

## 2016-08-27 NOTE — Telephone Encounter (Signed)
Continue current medications. Maintain upcoming appt with dr. Ronnald Ramp 08/29/16. Thank you

## 2016-08-29 ENCOUNTER — Encounter: Payer: 59 | Admitting: Internal Medicine

## 2016-10-25 ENCOUNTER — Ambulatory Visit (INDEPENDENT_AMBULATORY_CARE_PROVIDER_SITE_OTHER): Payer: 59 | Admitting: Internal Medicine

## 2016-10-25 ENCOUNTER — Other Ambulatory Visit (INDEPENDENT_AMBULATORY_CARE_PROVIDER_SITE_OTHER): Payer: 59

## 2016-10-25 ENCOUNTER — Encounter: Payer: Self-pay | Admitting: Internal Medicine

## 2016-10-25 VITALS — BP 196/120 | HR 102 | Temp 98.1°F | Resp 16 | Ht 65.0 in | Wt 226.5 lb

## 2016-10-25 DIAGNOSIS — R0683 Snoring: Secondary | ICD-10-CM

## 2016-10-25 DIAGNOSIS — I10 Essential (primary) hypertension: Secondary | ICD-10-CM | POA: Diagnosis not present

## 2016-10-25 DIAGNOSIS — E118 Type 2 diabetes mellitus with unspecified complications: Secondary | ICD-10-CM | POA: Diagnosis not present

## 2016-10-25 LAB — MICROALBUMIN / CREATININE URINE RATIO
Creatinine,U: 196 mg/dL
Microalb Creat Ratio: 2.1 mg/g (ref 0.0–30.0)
Microalb, Ur: 4.2 mg/dL — ABNORMAL HIGH (ref 0.0–1.9)

## 2016-10-25 LAB — CBC WITH DIFFERENTIAL/PLATELET
Basophils Absolute: 0.1 10*3/uL (ref 0.0–0.1)
Basophils Relative: 0.6 % (ref 0.0–3.0)
EOS PCT: 6.7 % — AB (ref 0.0–5.0)
Eosinophils Absolute: 0.6 10*3/uL (ref 0.0–0.7)
HEMATOCRIT: 39.3 % (ref 36.0–46.0)
HEMOGLOBIN: 13.1 g/dL (ref 12.0–15.0)
LYMPHS ABS: 3.7 10*3/uL (ref 0.7–4.0)
Lymphocytes Relative: 40.2 % (ref 12.0–46.0)
MCHC: 33.4 g/dL (ref 30.0–36.0)
MCV: 73 fl — AB (ref 78.0–100.0)
MONOS PCT: 8.1 % (ref 3.0–12.0)
Monocytes Absolute: 0.7 10*3/uL (ref 0.1–1.0)
NEUTROS PCT: 44.4 % (ref 43.0–77.0)
Neutro Abs: 4 10*3/uL (ref 1.4–7.7)
Platelets: 226 10*3/uL (ref 150.0–400.0)
RBC: 5.38 Mil/uL — AB (ref 3.87–5.11)
RDW: 16.9 % — ABNORMAL HIGH (ref 11.5–15.5)
WBC: 9.1 10*3/uL (ref 4.0–10.5)

## 2016-10-25 LAB — COMPREHENSIVE METABOLIC PANEL
ALK PHOS: 79 U/L (ref 39–117)
ALT: 22 U/L (ref 0–35)
AST: 23 U/L (ref 0–37)
Albumin: 4 g/dL (ref 3.5–5.2)
BUN: 17 mg/dL (ref 6–23)
CO2: 28 mEq/L (ref 19–32)
Calcium: 9.8 mg/dL (ref 8.4–10.5)
Chloride: 105 mEq/L (ref 96–112)
Creatinine, Ser: 0.9 mg/dL (ref 0.40–1.20)
GFR: 88 mL/min (ref >60.00)
GLUCOSE: 103 mg/dL — AB (ref 70–99)
POTASSIUM: 3.7 meq/L (ref 3.5–5.1)
Sodium: 138 mEq/L (ref 135–145)
TOTAL PROTEIN: 8 g/dL (ref 6.0–8.3)
Total Bilirubin: 0.5 mg/dL (ref 0.2–1.2)

## 2016-10-25 LAB — URINALYSIS, ROUTINE W REFLEX MICROSCOPIC
Hgb urine dipstick: NEGATIVE
KETONES UR: NEGATIVE
LEUKOCYTES UA: NEGATIVE
NITRITE: NEGATIVE
RBC / HPF: NONE SEEN (ref 0–?)
Specific Gravity, Urine: 1.025 (ref 1.000–1.030)
Total Protein, Urine: NEGATIVE
Urine Glucose: NEGATIVE
Urobilinogen, UA: 0.2 (ref 0.0–1.0)
pH: 6 (ref 5.0–8.0)

## 2016-10-25 LAB — HEMOGLOBIN A1C: HEMOGLOBIN A1C: 6.9 % — AB (ref 4.6–6.5)

## 2016-10-25 LAB — TSH: TSH: 1.31 u[IU]/mL (ref 0.35–4.50)

## 2016-10-25 MED ORDER — CHLORTHALIDONE 25 MG PO TABS: 25.0000 mg | ORAL_TABLET | Freq: Every day | ORAL | 1 refills | Status: DC

## 2016-10-25 MED ORDER — AZILSARTAN MEDOXOMIL 80 MG PO TABS: 1.0000 | ORAL_TABLET | Freq: Every day | ORAL | 0 refills | Status: DC

## 2016-10-25 MED ORDER — NEBIVOLOL HCL 5 MG PO TABS: 5.0000 mg | ORAL_TABLET | Freq: Every day | ORAL | 0 refills | Status: DC

## 2016-10-25 NOTE — Progress Notes (Signed)
Subjective:  Patient ID: [name], female    DOB: [birth year]  Age: 41 y.o. MRN: [id number]  CC: Hypertension   HPI LAKYLA BISWAS presents for Follow-up on hypertension and diabetes. She ran out of her antihypertensives several days ago and complains that her blood pressure is elevated. She has had a few headaches but she denies blurred vision, chest pain, shortness of breath, palpitations, or edema.  Outpatient Medications Prior to Visit  Medication Sig Dispense Refill  . levonorgestrel (MIRENA) 20 MCG/24HR IUD 1 Intra Uterine Device (1 each total) by Intrauterine route once. 1 each 0  . losartan-hydrochlorothiazide (HYZAAR) 100-25 MG tablet Take 1 tablet by mouth daily. 30 tablet 1  . metoprolol succinate (TOPROL-XL) 50 MG 24 hr tablet Take 1 tablet (50 mg total) by mouth daily. Take with or immediately following a meal. 30 tablet 1   No facility-administered medications prior to visit.     ROS Review of Systems  Constitutional: Positive for unexpected weight change (wt gain). Negative for activity change, appetite change, diaphoresis and fatigue.  HENT: Negative.  Negative for sinus pressure and trouble swallowing.   Eyes: Negative.  Negative for visual disturbance.  Respiratory: Positive for apnea (heavy snoring). Negative for cough, chest tightness, shortness of breath and wheezing.   Cardiovascular: Negative.  Negative for chest pain, palpitations and leg swelling.  Gastrointestinal: Negative for abdominal pain, constipation, diarrhea and nausea.  Genitourinary: Negative.  Negative for decreased urine volume, difficulty urinating, dysuria, genital sores, hematuria and urgency.  Musculoskeletal: Negative for back pain, neck pain and neck stiffness.  Skin: Negative.   Neurological: Positive for headaches. Negative for dizziness, tremors, syncope, facial asymmetry, speech difficulty, weakness and numbness.  Hematological: Negative for adenopathy. Does not bruise/bleed  easily.  Psychiatric/Behavioral: Negative.     Objective:  BP (!) 196/120   Pulse (!) 102   Temp 98.1 F (36.7 C) (Oral)   Resp 16   Ht 5\' 5"  (1.651 m)   Wt 226 lb 8 oz (102.7 kg)   SpO2 98%   BMI 37.69 kg/m   BP Readings from Last 3 Encounters:  10/25/16 (!) 196/120  08/27/16 (!) 142/110  08/17/16 (!) 160/118    Wt Readings from Last 3 Encounters:  10/25/16 226 lb 8 oz (102.7 kg)  08/16/16 223 lb (101.2 kg)  06/06/15 213 lb (96.6 kg)    Physical Exam  Constitutional: She is oriented to person, place, and time. No distress.  HENT:  Head: Normocephalic and atraumatic.  Mouth/Throat: Oropharynx is clear and moist. No oropharyngeal exudate.  Eyes: Conjunctivae are normal. Right eye exhibits no discharge. Left eye exhibits no discharge. No scleral icterus.  Neck: Normal range of motion. Neck supple. No JVD present. No tracheal deviation present. No thyromegaly present.  Cardiovascular: Normal rate, regular rhythm, normal heart sounds and intact distal pulses.  Exam reveals no gallop and no friction rub.   No murmur heard. Pulmonary/Chest: Effort normal and breath sounds normal. No stridor. No respiratory distress. She has no wheezes. She has no rales. She exhibits no tenderness.  Abdominal: Soft. Bowel sounds are normal. She exhibits no distension and no mass. There is no tenderness. There is no rebound and no guarding.  Musculoskeletal: Normal range of motion. She exhibits no edema, tenderness or deformity.  Lymphadenopathy:    She has no cervical adenopathy.  Neurological: She is oriented to person, place, and time.  Skin: Skin is warm and dry. No rash noted. She is not diaphoretic. No erythema.  Vitals reviewed.   Lab Results  Component Value Date   WBC 9.1 10/25/2016   HGB 13.1 10/25/2016   HCT 39.3 10/25/2016   PLT 226.0 10/25/2016   GLUCOSE 103 (H) 10/25/2016   CHOL 192 06/06/2015   TRIG 62.0 06/06/2015   HDL 71.50 06/06/2015   LDLCALC 108 (H) 06/06/2015    ALT 22 10/25/2016   AST 23 10/25/2016   NA 138 10/25/2016   K 3.7 10/25/2016   CL 105 10/25/2016   CREATININE 0.90 10/25/2016   BUN 17 10/25/2016   CO2 28 10/25/2016   TSH 1.31 10/25/2016   INR 1.1 09/14/2008   HGBA1C 6.9 (H) 10/25/2016   MICROALBUR 4.2 (H) 10/25/2016    Dg Chest 2 View  Result Date: 03/03/2012 *RADIOLOGY REPORT* Clinical Data: Cough, shortness of breath and wheezing.  Upper back pain. CHEST - 2 VIEW Comparison: CT chest and chest radiograph 04/26/2009. Findings: Trachea is midline.  Heart size normal.  Lungs are somewhat low in volume but clear.  No pleural fluid. IMPRESSION: No acute findings. Original Report Authenticated By: Luretha Rued, M.D.  Ct Angio Chest W/cm &/or Wo Cm  Result Date: 03/03/2012 *RADIOLOGY REPORT* Clinical Data: Chest pain, shortness of breath, and cough. CT ANGIOGRAPHY CHEST Technique:  Multidetector CT imaging of the chest using the standard protocol during bolus administration of intravenous contrast. Multiplanar reconstructed images including MIPs were obtained and reviewed to evaluate the vascular anatomy. Contrast: 141mL OMNIPAQUE IOHEXOL 350 MG/ML SOLN Comparison: Chest x-ray dated 03/03/2012 and chest CT dated 04/26/2009 Findings: There are no pulmonary emboli, infiltrates, effusions, mass lesions, or other significant abnormalities.  Heart size is normal.  No hilar or mediastinal adenopathy.  Osseous structures are normal.  Visualized portion of the upper abdomen is normal. IMPRESSION: Normal exam. Original Report Authenticated By: Larey Seat, M.D.   Assessment & Plan:   Deloros was seen today for hypertension.  Diagnoses and all orders for this visit:  Essential hypertension- her blood pressure is not adequately well controlled, her labs are negative for any secondary causes or end organ damage. I will upgrade her to a more effective beta blocker, ARB, and thiazide diuretic. I'm also concerned she may have sleep apnea. -      Comprehensive metabolic panel; Future -     CBC with Differential/Platelet; Future -     TSH; Future -     Urinalysis, Routine w reflex microscopic; Future -     nebivolol (BYSTOLIC) 5 MG tablet; Take 1 tablet (5 mg total) by mouth daily. -     Azilsartan Medoxomil (EDARBI) 80 MG TABS; Take 1 tablet (80 mg total) by mouth daily. -     chlorthalidone (HYGROTON) 25 MG tablet; Take 1 tablet (25 mg total) by mouth daily.  Type 2 diabetes mellitus with complication, without long-term current use of insulin ([place])- her A1c is up to 6.9%, no medications are needed at this time but I did encourage her to work on her lifestyle modifications. -     Comprehensive metabolic panel; Future -     Hemoglobin A1c; Future -     Microalbumin / creatinine urine ratio; Future -     Azilsartan Medoxomil (EDARBI) 80 MG TABS; Take 1 tablet (80 mg total) by mouth daily.  Snoring -     Ambulatory referral to Sleep Studies   I have discontinued Ms. Sammon's losartan-hydrochlorothiazide and metoprolol succinate. I am also having her start on nebivolol, Azilsartan Medoxomil, and chlorthalidone. Additionally, I am having  her maintain her levonorgestrel.  Meds ordered this encounter  Medications  . nebivolol (BYSTOLIC) 5 MG tablet    Sig: Take 1 tablet (5 mg total) by mouth daily.    Dispense:  70 tablet    Refill:  0  . Azilsartan Medoxomil (EDARBI) 80 MG TABS    Sig: Take 1 tablet (80 mg total) by mouth daily.    Dispense:  35 tablet    Refill:  0  . chlorthalidone (HYGROTON) 25 MG tablet    Sig: Take 1 tablet (25 mg total) by mouth daily.    Dispense:  90 tablet    Refill:  1     Follow-up: Return in about 4 weeks (around 11/22/2016).  Scarlette Calico, MD

## 2016-10-25 NOTE — Patient Instructions (Signed)

## 2016-10-25 NOTE — Progress Notes (Signed)
Pre-visit discussion using our clinic review tool. No additional management support is needed unless otherwise documented below in the visit note.  

## 2016-10-27 ENCOUNTER — Encounter: Payer: Self-pay | Admitting: Internal Medicine

## 2016-11-30 ENCOUNTER — Telehealth: Payer: Self-pay | Admitting: Internal Medicine

## 2016-11-30 NOTE — Telephone Encounter (Signed)
Pt called and said that when she was here to see Dr Ronnald Ramp he gave her samples of Edarbi (35 tabs) and Bystolic (70 tabs). She said that she will not have enough of the Edarbi to last her. Can she have another sample or can a prescription be called in? Please advise.

## 2016-12-03 NOTE — Telephone Encounter (Signed)
Pt called again about this °

## 2016-12-05 ENCOUNTER — Telehealth: Payer: Self-pay

## 2016-12-05 ENCOUNTER — Institutional Professional Consult (permissible substitution): Payer: 59 | Admitting: Neurology

## 2016-12-05 ENCOUNTER — Encounter: Payer: Self-pay | Admitting: Internal Medicine

## 2016-12-05 ENCOUNTER — Ambulatory Visit (INDEPENDENT_AMBULATORY_CARE_PROVIDER_SITE_OTHER): Payer: 59 | Admitting: Internal Medicine

## 2016-12-05 VITALS — BP 180/120 | HR 82 | Temp 98.2°F | Resp 16 | Ht 65.0 in | Wt 233.0 lb

## 2016-12-05 DIAGNOSIS — R0683 Snoring: Secondary | ICD-10-CM | POA: Diagnosis not present

## 2016-12-05 DIAGNOSIS — I10 Essential (primary) hypertension: Secondary | ICD-10-CM

## 2016-12-05 MED ORDER — CHLORTHALIDONE 25 MG PO TABS: 25.0000 mg | ORAL_TABLET | Freq: Every day | ORAL | 1 refills | Status: DC

## 2016-12-05 MED ORDER — NEBIVOLOL HCL 5 MG PO TABS: 5.0000 mg | ORAL_TABLET | Freq: Every day | ORAL | 1 refills | Status: DC

## 2016-12-05 NOTE — Progress Notes (Signed)
Subjective:  Patient ID: [name], female    DOB: Feb 03, 1976  Age: 41 y.o. MRN: [id number]  CC: Hypertension   HPI [name] presents for a blood pressure check. She has been taking the ARB and the beta blocker but for some reason she did not get chlorthalidone from the pharmacy. Her blood pressure has not been well controlled. She was also referred for sleep study due to her history of heavy snoring but she missed the appointment. She denies any headache/blurred vision/chest pain/shortness of breath/palpitations/edema/fatigue.  Outpatient Medications Prior to Visit  Medication Sig Dispense Refill  . Azilsartan Medoxomil (EDARBI) 80 MG TABS Take 1 tablet (80 mg total) by mouth daily. 35 tablet 0  . levonorgestrel (MIRENA) 20 MCG/24HR IUD 1 Intra Uterine Device (1 each total) by Intrauterine route once. 1 each 0  . nebivolol (BYSTOLIC) 5 MG tablet Take 1 tablet (5 mg total) by mouth daily. 70 tablet 0  . chlorthalidone (HYGROTON) 25 MG tablet Take 1 tablet (25 mg total) by mouth daily. (Patient not taking: Reported on 12/05/2016) 90 tablet 1   No facility-administered medications prior to visit.     ROS Review of Systems  Constitutional: Negative for activity change, appetite change, diaphoresis, fatigue and unexpected weight change.  HENT: Negative.   Eyes: Negative for visual disturbance.  Respiratory: Negative.  Negative for chest tightness, shortness of breath and wheezing.   Cardiovascular: Negative for chest pain, palpitations and leg swelling.  Gastrointestinal: Negative for abdominal pain, constipation, diarrhea, nausea and vomiting.  Endocrine: Negative.   Genitourinary: Negative.  Negative for decreased urine volume, difficulty urinating, dysuria, frequency and urgency.  Musculoskeletal: Negative for back pain and neck pain.  Skin: Negative.   Allergic/Immunologic: Negative.   Neurological: Negative.  Negative for dizziness, weakness, numbness and headaches.    Hematological: Negative for adenopathy. Does not bruise/bleed easily.  Psychiatric/Behavioral: Negative.     Objective:  BP (!) 180/120 (BP Location: Left Arm, Patient Position: Sitting, Cuff Size: Large)   Pulse 82   Temp 98.2 F (36.8 C) (Oral)   Ht 5\' 5"  (1.651 m)   Wt 233 lb (105.7 kg)   SpO2 97%   BMI 38.77 kg/m   BP Readings from Last 3 Encounters:  12/05/16 (!) 180/120  10/25/16 (!) 196/120  08/27/16 (!) 142/110    Wt Readings from Last 3 Encounters:  12/05/16 233 lb (105.7 kg)  10/25/16 226 lb 8 oz (102.7 kg)  08/16/16 223 lb (101.2 kg)    Physical Exam  Constitutional: She is oriented to person, place, and time. No distress.  HENT:  Mouth/Throat: Oropharynx is clear and moist. No oropharyngeal exudate.  Eyes: Conjunctivae are normal. Right eye exhibits no discharge. Left eye exhibits no discharge. No scleral icterus.  Neck: Normal range of motion. Neck supple. No JVD present. No tracheal deviation present. No thyromegaly present.  Cardiovascular: Normal rate, regular rhythm and intact distal pulses.  Exam reveals no gallop and no friction rub.   No murmur heard. Pulmonary/Chest: Effort normal and breath sounds normal. No stridor. No respiratory distress. She has no wheezes. She has no rales. She exhibits no tenderness.  Abdominal: Soft. Bowel sounds are normal. She exhibits no distension and no mass. There is no tenderness. There is no rebound and no guarding.  Musculoskeletal: Normal range of motion. She exhibits no edema, tenderness or deformity.  Lymphadenopathy:    She has no cervical adenopathy.  Neurological: She is oriented to person, place, and time.  Skin:  Skin is warm and dry. No rash noted. She is not diaphoretic. No erythema. No pallor.  Vitals reviewed.   Lab Results  Component Value Date   WBC 9.1 10/25/2016   HGB 13.1 10/25/2016   HCT 39.3 10/25/2016   PLT 226.0 10/25/2016   GLUCOSE 103 (H) 10/25/2016   CHOL 192 06/06/2015   TRIG 62.0  06/06/2015   HDL 71.50 06/06/2015   LDLCALC 108 (H) 06/06/2015   ALT 22 10/25/2016   AST 23 10/25/2016   NA 138 10/25/2016   K 3.7 10/25/2016   CL 105 10/25/2016   CREATININE 0.90 10/25/2016   BUN 17 10/25/2016   CO2 28 10/25/2016   TSH 1.31 10/25/2016   INR 1.1 09/14/2008   HGBA1C 6.9 (H) 10/25/2016   MICROALBUR 4.2 (H) 10/25/2016    Dg Chest 2 View  Result Date: 03/03/2012 *RADIOLOGY REPORT* Clinical Data: Cough, shortness of breath and wheezing.  Upper back pain. CHEST - 2 VIEW Comparison: CT chest and chest radiograph 04/26/2009. Findings: Trachea is midline.  Heart size normal.  Lungs are somewhat low in volume but clear.  No pleural fluid. IMPRESSION: No acute findings. Original Report Authenticated By: Luretha Rued, M.D.  Ct Angio Chest W/cm &/or Wo Cm  Result Date: 03/03/2012 *RADIOLOGY REPORT* Clinical Data: Chest pain, shortness of breath, and cough. CT ANGIOGRAPHY CHEST Technique:  Multidetector CT imaging of the chest using the standard protocol during bolus administration of intravenous contrast. Multiplanar reconstructed images including MIPs were obtained and reviewed to evaluate the vascular anatomy. Contrast: 126mL OMNIPAQUE IOHEXOL 350 MG/ML SOLN Comparison: Chest x-ray dated 03/03/2012 and chest CT dated 04/26/2009 Findings: There are no pulmonary emboli, infiltrates, effusions, mass lesions, or other significant abnormalities.  Heart size is normal.  No hilar or mediastinal adenopathy.  Osseous structures are normal.  Visualized portion of the upper abdomen is normal. IMPRESSION: Normal exam. Original Report Authenticated By: Larey Seat, M.D.   Assessment & Plan:   [name] was seen today for hypertension.  Diagnoses and all orders for this visit:  Snoring- I am concerned she has sleep apnea and that this may be contributing to her elevated blood pressure so again, I have referred her to sleep medicine to consider having a sleep study performed. -      Ambulatory referral to Sleep Studies  Essential hypertension- her blood pressure is not adequately well controlled so I've asked her to continue the ARB and beta blocker but to also start the chlorthalidone is previously directed. She agrees to return in 6 weeks for me to recheck her blood pressure. -     chlorthalidone (HYGROTON) 25 MG tablet; Take 1 tablet (25 mg total) by mouth daily. -     nebivolol (BYSTOLIC) 5 MG tablet; Take 1 tablet (5 mg total) by mouth daily.   I am having [name] maintain her levonorgestrel, Azilsartan Medoxomil, chlorthalidone, and nebivolol.  Meds ordered this encounter  Medications  . chlorthalidone (HYGROTON) 25 MG tablet    Sig: Take 1 tablet (25 mg total) by mouth daily.    Dispense:  90 tablet    Refill:  1  . nebivolol (BYSTOLIC) 5 MG tablet    Sig: Take 1 tablet (5 mg total) by mouth daily.    Dispense:  90 tablet    Refill:  1     Follow-up: No Follow-up on file.  Scarlette Calico, MD

## 2016-12-05 NOTE — Telephone Encounter (Signed)
Pt did not show for their appt with Dr. Athar today.  

## 2016-12-05 NOTE — Patient Instructions (Signed)

## 2016-12-05 NOTE — Progress Notes (Signed)
Pre visit review using our clinic review tool, if applicable. No additional management support is needed unless otherwise documented below in the visit note. 

## 2016-12-06 NOTE — Telephone Encounter (Signed)
Pt was due for appt and this has been completed.

## 2016-12-11 ENCOUNTER — Encounter: Payer: Self-pay | Admitting: Neurology

## 2017-01-23 ENCOUNTER — Ambulatory Visit: Payer: 59 | Admitting: Internal Medicine

## 2017-03-14 LAB — HM DIABETES EYE EXAM

## 2017-04-04 ENCOUNTER — Encounter: Payer: Self-pay | Admitting: Internal Medicine

## 2017-04-04 ENCOUNTER — Ambulatory Visit (INDEPENDENT_AMBULATORY_CARE_PROVIDER_SITE_OTHER): Payer: 59 | Admitting: Internal Medicine

## 2017-04-04 ENCOUNTER — Encounter (HOSPITAL_COMMUNITY): Payer: Self-pay | Admitting: Emergency Medicine

## 2017-04-04 ENCOUNTER — Emergency Department (HOSPITAL_COMMUNITY): Payer: 59

## 2017-04-04 ENCOUNTER — Observation Stay (HOSPITAL_COMMUNITY)
Admission: EM | Admit: 2017-04-04 | Discharge: 2017-04-05 | Disposition: A | Discharge status: Home / Self Care | Payer: 59 | Attending: Family Medicine | Admitting: Family Medicine

## 2017-04-04 VITALS — BP 230/130 | HR 89 | Temp 98.9°F | Resp 16 | Ht 65.0 in | Wt 227.0 lb

## 2017-04-04 DIAGNOSIS — I160 Hypertensive urgency: Secondary | ICD-10-CM | POA: Insufficient documentation

## 2017-04-04 DIAGNOSIS — I119 Hypertensive heart disease without heart failure: Secondary | ICD-10-CM | POA: Insufficient documentation

## 2017-04-04 DIAGNOSIS — E785 Hyperlipidemia, unspecified: Secondary | ICD-10-CM | POA: Insufficient documentation

## 2017-04-04 DIAGNOSIS — Z833 Family history of diabetes mellitus: Secondary | ICD-10-CM | POA: Diagnosis not present

## 2017-04-04 DIAGNOSIS — E119 Type 2 diabetes mellitus without complications: Secondary | ICD-10-CM | POA: Insufficient documentation

## 2017-04-04 DIAGNOSIS — Z79899 Other long term (current) drug therapy: Secondary | ICD-10-CM | POA: Insufficient documentation

## 2017-04-04 DIAGNOSIS — Z8249 Family history of ischemic heart disease and other diseases of the circulatory system: Secondary | ICD-10-CM | POA: Insufficient documentation

## 2017-04-04 DIAGNOSIS — R51 Headache: Secondary | ICD-10-CM | POA: Insufficient documentation

## 2017-04-04 DIAGNOSIS — E669 Obesity, unspecified: Secondary | ICD-10-CM | POA: Diagnosis not present

## 2017-04-04 DIAGNOSIS — R0683 Snoring: Secondary | ICD-10-CM | POA: Diagnosis not present

## 2017-04-04 DIAGNOSIS — I161 Hypertensive emergency: Secondary | ICD-10-CM

## 2017-04-04 DIAGNOSIS — Z6837 Body mass index (BMI) 37.0-37.9, adult: Secondary | ICD-10-CM | POA: Insufficient documentation

## 2017-04-04 DIAGNOSIS — I10 Essential (primary) hypertension: Secondary | ICD-10-CM | POA: Diagnosis not present

## 2017-04-04 DIAGNOSIS — Z9114 Patient's other noncompliance with medication regimen: Secondary | ICD-10-CM | POA: Insufficient documentation

## 2017-04-04 DIAGNOSIS — E118 Type 2 diabetes mellitus with unspecified complications: Secondary | ICD-10-CM

## 2017-04-04 DIAGNOSIS — R030 Elevated blood-pressure reading, without diagnosis of hypertension: Secondary | ICD-10-CM | POA: Diagnosis not present

## 2017-04-04 LAB — URINALYSIS, ROUTINE W REFLEX MICROSCOPIC
Bilirubin Urine: NEGATIVE
Glucose, UA: NEGATIVE mg/dL
Hgb urine dipstick: NEGATIVE
KETONES UR: NEGATIVE mg/dL
Nitrite: NEGATIVE
PH: 7 (ref 5.0–8.0)
Protein, ur: 100 mg/dL — AB
SPECIFIC GRAVITY, URINE: 1.023 (ref 1.005–1.030)

## 2017-04-04 LAB — COMPREHENSIVE METABOLIC PANEL
ALK PHOS: 80 U/L (ref 38–126)
ALT: 16 U/L (ref 14–54)
AST: 25 U/L (ref 15–41)
Albumin: 3.5 g/dL (ref 3.5–5.0)
Anion gap: 6 (ref 5–15)
BUN: 12 mg/dL (ref 6–20)
CALCIUM: 9 mg/dL (ref 8.9–10.3)
CHLORIDE: 107 mmol/L (ref 101–111)
CO2: 27 mmol/L (ref 22–32)
CREATININE: 1.09 mg/dL — AB (ref 0.44–1.00)
GFR calc Af Amer: >60 mL/min (ref >60)
GFR calc non Af Amer: >60 mL/min (ref >60)
GLUCOSE: 174 mg/dL — AB (ref 65–99)
Potassium: 3.8 mmol/L (ref 3.5–5.1)
SODIUM: 140 mmol/L (ref 135–145)
Total Bilirubin: 0.7 mg/dL (ref 0.3–1.2)
Total Protein: 7.3 g/dL (ref 6.5–8.1)

## 2017-04-04 LAB — CBC
HCT: 36.6 % (ref 36.0–46.0)
Hemoglobin: 12.5 g/dL (ref 12.0–15.0)
MCH: 24.5 pg — AB (ref 26.0–34.0)
MCHC: 34.2 g/dL (ref 30.0–36.0)
MCV: 71.8 fL — AB (ref 78.0–100.0)
PLATELETS: 198 10*3/uL (ref 150–400)
RBC: 5.1 MIL/uL (ref 3.87–5.11)
RDW: 15.1 % (ref 11.5–15.5)
WBC: 7.2 10*3/uL (ref 4.0–10.5)

## 2017-04-04 MED ORDER — CHLORTHALIDONE 25 MG PO TABS
25.0000 mg | ORAL_TABLET | Freq: Every day | ORAL | Status: DC
  Administered 2017-04-05: 25 mg via ORAL
  Filled 2017-04-04: qty 1

## 2017-04-04 MED ORDER — IRBESARTAN 300 MG PO TABS
300.0000 mg | ORAL_TABLET | Freq: Every day | ORAL | Status: DC
  Administered 2017-04-05: 300 mg via ORAL
  Filled 2017-04-04 (×2): qty 1

## 2017-04-04 MED ORDER — ACETAMINOPHEN 325 MG PO TABS: 650.0000 mg | ORAL_TABLET | Freq: Four times a day (QID) | ORAL | Status: DC | PRN

## 2017-04-04 MED ORDER — ACETAMINOPHEN 650 MG RE SUPP: 650.0000 mg | Freq: Four times a day (QID) | RECTAL | Status: DC | PRN

## 2017-04-04 MED ORDER — HYDRALAZINE HCL 20 MG/ML IJ SOLN: 5.0000 mg | INTRAMUSCULAR | Status: DC | PRN

## 2017-04-04 MED ORDER — ONDANSETRON HCL 4 MG/2ML IJ SOLN: 4.0000 mg | Freq: Four times a day (QID) | INTRAMUSCULAR | Status: DC | PRN

## 2017-04-04 MED ORDER — NEBIVOLOL HCL 5 MG PO TABS
5.0000 mg | ORAL_TABLET | Freq: Every day | ORAL | Status: DC
  Administered 2017-04-05: 5 mg via ORAL
  Filled 2017-04-04: qty 1

## 2017-04-04 MED ORDER — ZOLPIDEM TARTRATE 5 MG PO TABS: 5.0000 mg | ORAL_TABLET | Freq: Every evening | ORAL | Status: DC | PRN

## 2017-04-04 MED ORDER — ENOXAPARIN SODIUM 40 MG/0.4ML ~~LOC~~ SOLN
40.0000 mg | SUBCUTANEOUS | Status: DC
  Administered 2017-04-05: 40 mg via SUBCUTANEOUS
  Filled 2017-04-04: qty 0.4

## 2017-04-04 MED ORDER — ONDANSETRON HCL 4 MG PO TABS: 4.0000 mg | ORAL_TABLET | Freq: Four times a day (QID) | ORAL | Status: DC | PRN

## 2017-04-04 NOTE — Progress Notes (Signed)
Subjective:  Patient ID: [name], female    DOB: [birth year]  Age: 41 y.o. MRN: [id number]  CC: Hypertension   HPI [name] presents for a BP check - She has not taken anything for HTN for about 3 weeks because she didn't have enough money to buy her medications. In the ensuing weeks she has developed headache and shortness of breath with dyspnea on exertion.  Outpatient Medications Prior to Visit  Medication Sig Dispense Refill  . Azilsartan Medoxomil (EDARBI) 80 MG TABS Take 1 tablet (80 mg total) by mouth daily. 35 tablet 0  . chlorthalidone (HYGROTON) 25 MG tablet Take 1 tablet (25 mg total) by mouth daily. 90 tablet 1  . levonorgestrel (MIRENA) 20 MCG/24HR IUD 1 Intra Uterine Device (1 each total) by Intrauterine route once. 1 each 0  . nebivolol (BYSTOLIC) 5 MG tablet Take 1 tablet (5 mg total) by mouth daily. 90 tablet 1   No facility-administered medications prior to visit.     ROS Review of Systems  Constitutional: Negative.   HENT: Negative.   Eyes: Negative for visual disturbance.  Respiratory: Positive for apnea and shortness of breath. Negative for chest tightness and wheezing.   Cardiovascular: Negative for chest pain, palpitations and leg swelling.  Gastrointestinal: Negative for abdominal pain, constipation, diarrhea, nausea and vomiting.  Endocrine: Negative.   Genitourinary: Negative.  Negative for difficulty urinating.  Musculoskeletal: Negative.   Skin: Negative.   Allergic/Immunologic: Negative.   Neurological: Positive for headaches. Negative for dizziness, weakness and light-headedness.  Hematological: Negative.  Negative for adenopathy. Does not bruise/bleed easily.  Psychiatric/Behavioral: Negative.     Objective:  BP (!) 230/130 (BP Location: Left Arm, Patient Position: Sitting, Cuff Size: Large)   Pulse 89   Temp 98.9 F (37.2 C) (Oral)   Resp 16   Ht 5\' 5"  (1.651 m)   Wt 227 lb (103 kg)   SpO2 98%   BMI 37.77 kg/m   BP  Readings from Last 3 Encounters:  04/04/17 (!) 230/130  12/05/16 (!) 180/120  10/25/16 (!) 196/120    Wt Readings from Last 3 Encounters:  04/04/17 227 lb (103 kg)  12/05/16 233 lb (105.7 kg)  10/25/16 226 lb 8 oz (102.7 kg)    Physical Exam  Constitutional: She is oriented to person, place, and time. No distress.  HENT:  Mouth/Throat: Oropharynx is clear and moist. No oropharyngeal exudate.  Eyes: Conjunctivae are normal. Right eye exhibits no discharge. Left eye exhibits no discharge. No scleral icterus.  Neck: Normal range of motion. Neck supple. No JVD present. No thyromegaly present.  Cardiovascular: Normal rate, regular rhythm and intact distal pulses.  Exam reveals no gallop and no friction rub.   No murmur heard. EKG -- Sinus  Tachycardia  -Left axis.   -Combined atrial enlargement.   -  Negative T-waves  -Possible  Anterolateral  ischemia.   ABNORMAL - Lateral T wave changes are more prominent compared to the prior EKG  Pulmonary/Chest: Effort normal and breath sounds normal. No respiratory distress. She has no wheezes. She has no rales. She exhibits no tenderness.  Abdominal: Soft. Bowel sounds are normal. She exhibits no distension and no mass. There is no tenderness. There is no rebound and no guarding.  Musculoskeletal: Normal range of motion. She exhibits no edema, tenderness or deformity.  Lymphadenopathy:    She has no cervical adenopathy.  Neurological: She is alert and oriented to person, place, and time.  Skin: Skin is warm  and dry. No rash noted. She is not diaphoretic. No erythema. No pallor.  Vitals reviewed.   Lab Results  Component Value Date   WBC 9.1 10/25/2016   HGB 13.1 10/25/2016   HCT 39.3 10/25/2016   PLT 226.0 10/25/2016   GLUCOSE 103 (H) 10/25/2016   CHOL 192 06/06/2015   TRIG 62.0 06/06/2015   HDL 71.50 06/06/2015   LDLCALC 108 (H) 06/06/2015   ALT 22 10/25/2016   AST 23 10/25/2016   NA 138 10/25/2016   K 3.7 10/25/2016   CL 105  10/25/2016   CREATININE 0.90 10/25/2016   BUN 17 10/25/2016   CO2 28 10/25/2016   TSH 1.31 10/25/2016   INR 1.1 09/14/2008   HGBA1C 6.9 (H) 10/25/2016   MICROALBUR 4.2 (H) 10/25/2016    Dg Chest 2 View  Result Date: 03/03/2012 *RADIOLOGY REPORT* Clinical Data: Cough, shortness of breath and wheezing.  Upper back pain. CHEST - 2 VIEW Comparison: CT chest and chest radiograph 04/26/2009. Findings: Trachea is midline.  Heart size normal.  Lungs are somewhat low in volume but clear.  No pleural fluid. IMPRESSION: No acute findings. Original Report Authenticated By: Luretha Rued, M.D.  Ct Angio Chest W/cm &/or Wo Cm  Result Date: 03/03/2012 *RADIOLOGY REPORT* Clinical Data: Chest pain, shortness of breath, and cough. CT ANGIOGRAPHY CHEST Technique:  Multidetector CT imaging of the chest using the standard protocol during bolus administration of intravenous contrast. Multiplanar reconstructed images including MIPs were obtained and reviewed to evaluate the vascular anatomy. Contrast: 130mL OMNIPAQUE IOHEXOL 350 MG/ML SOLN Comparison: Chest x-ray dated 03/03/2012 and chest CT dated 04/26/2009 Findings: There are no pulmonary emboli, infiltrates, effusions, mass lesions, or other significant abnormalities.  Heart size is normal.  No hilar or mediastinal adenopathy.  Osseous structures are normal.  Visualized portion of the upper abdomen is normal. IMPRESSION: Normal exam. Original Report Authenticated By: Larey Seat, M.D.   Assessment & Plan:   [name] was seen today for hypertension.  Diagnoses and all orders for this visit:  Essential hypertension -     Cancel: Basic metabolic panel; Future -     EKG 12-Lead  Type 2 diabetes mellitus with complication, without long-term current use of insulin (HCC) -     Cancel: Basic metabolic panel; Future -     Cancel: Hemoglobin A1c; Future  Dyslipidemia, goal LDL below 100 -     Cancel: Lipid panel; Future  Hypertensive emergency- she  has an extremely high systolic and diastolic blood pressure. She is symptomatic and has new EKG changes. I've asked her to be seen in the [facility] ED and possibly admitted for blood pressure control and urgent cardiac evaluation.   I am having [name] maintain her levonorgestrel, Azilsartan Medoxomil, chlorthalidone, and nebivolol.  No orders of the defined types were placed in this encounter.    Follow-up: No Follow-up on file.  Scarlette Calico, MD

## 2017-04-04 NOTE — Patient Instructions (Signed)

## 2017-04-04 NOTE — ED Triage Notes (Signed)
Pt here for eval of htn and possible irregular EKG; pt has not taken meds x 3 weeks

## 2017-04-04 NOTE — ED Provider Notes (Signed)
[facility] DEPT Provider Note   CSN: [id number] Arrival date & time: 04/04/17  1607     History   Chief Complaint Chief Complaint  Patient presents with  . Hypertension    HPI KYRIA BUMGARDNER is a 41 y.o. female. With history of HTN, DM who presents with complaints of hypertension. She reports she has run out of her BP meds for the past 3 weeks. She reports the prescription was too expensive. She has had shortness of breath intermittently with exertion and sometimes at rest, occurring a few times a week. Denies leg swelling, PND, or orthopnea. She has been headaches occurring a few times a few for the past 3 weeks, lasting no longer than 5-10 minutes. She denies any weakness, changes in vision, numbness or tingling. She went to her PCM today for refill, and was told her BP was too high and needed to come to ED for admission.   HPI  Past Medical History:  Diagnosis Date  . Diabetes mellitus    HISTORY OF GESTATIONAL DIABETES  . Hypertension   . IUD    MIRENA INSERTED 01/24/11    Patient Active Problem List   Diagnosis Date Noted  . Dyslipidemia, goal LDL below 100 04/04/2017  . Hypertensive urgency 04/04/2017  . Snoring 10/25/2016  . Obesity (BMI 30-39.9) 03/09/2014  . Diabetes mellitus without complication ([place]) 41/66/0630  . Essential hypertension 06/01/2009    Past Surgical History:  Procedure Laterality Date  . CESAREAN SECTION  2006    OB History    No data available       Home Medications    Prior to Admission medications   Medication Sig Start Date End Date Taking? Authorizing Provider  Azilsartan Medoxomil (EDARBI) 80 MG TABS Take 1 tablet (80 mg total) by mouth daily. 10/25/16  Yes Janith Lima, MD  chlorthalidone (HYGROTON) 25 MG tablet Take 1 tablet (25 mg total) by mouth daily. 12/05/16  Yes Janith Lima, MD  levonorgestrel (MIRENA) 20 MCG/24HR IUD 1 Intra Uterine Device (1 each total) by Intrauterine route once. 01/04/11  Yes Janith Lima,  MD  nebivolol (BYSTOLIC) 5 MG tablet Take 1 tablet (5 mg total) by mouth daily. 12/05/16  Yes Janith Lima, MD    Family History Family History  Problem Relation Age of Onset  . Diabetes Mother   . Heart disease Mother   . Hypertension Maternal Aunt     Social History Social History  Substance Use Topics  . Smoking status: Never Smoker  . Smokeless tobacco: Never Used  . Alcohol use No     Allergies   Patient has no known allergies.   Review of Systems Review of Systems  Constitutional: Negative for chills and fever.  HENT: Negative for ear pain and sore throat.   Eyes: Negative for pain and visual disturbance.  Respiratory: Positive for shortness of breath. Negative for cough.   Cardiovascular: Negative for chest pain and palpitations.  Gastrointestinal: Negative for abdominal pain and vomiting.  Genitourinary: Negative for dysuria and hematuria.  Musculoskeletal: Negative for arthralgias and back pain.  Skin: Negative for color change and rash.  Neurological: Positive for headaches. Negative for dizziness, seizures, syncope and light-headedness.  All other systems reviewed and are negative.    Physical Exam Updated Vital Signs BP (!) 172/109   Pulse (!) 106   Temp 98.1 F (36.7 C) (Oral)   Resp (!) 24   SpO2 97%   Physical Exam  Constitutional: She appears well-developed  and well-nourished. No distress.  HENT:  Head: Normocephalic and atraumatic.  Eyes: Conjunctivae are normal.  Neck: Neck supple.  Cardiovascular: Normal rate and regular rhythm.   No murmur heard. Pulmonary/Chest: Effort normal and breath sounds normal. No respiratory distress.  Abdominal: Soft. There is no tenderness.  Musculoskeletal: She exhibits no edema.  Neurological: She is alert.  Skin: Skin is warm and dry.  Psychiatric: She has a normal mood and affect.  Nursing note and vitals reviewed.    ED Treatments / Results  Labs (all labs ordered are listed, but only abnormal  results are displayed) Labs Reviewed  COMPREHENSIVE METABOLIC PANEL - Abnormal; Notable for the following:       Result Value   Glucose, Bld 174 (*)    Creatinine, Ser 1.09 (*)    All other components within normal limits  CBC - Abnormal; Notable for the following:    MCV 71.8 (*)    MCH 24.5 (*)    All other components within normal limits  URINALYSIS, ROUTINE W REFLEX MICROSCOPIC - Abnormal; Notable for the following:    APPearance CLOUDY (*)    Protein, ur 100 (*)    Leukocytes, UA MODERATE (*)    Bacteria, UA RARE (*)    Squamous Epithelial / LPF 6-30 (*)    All other components within normal limits  HEMOGLOBIN A1C  LIPID PANEL  TROPONIN I  TROPONIN I  TROPONIN I  HIV ANTIBODY (ROUTINE TESTING)    EKG  EKG Interpretation  Date/Time:  Thursday April 04 2017 16:25:19 EDT Ventricular Rate:  101 PR Interval:  154 QRS Duration: 86 QT Interval:  364 QTC Calculation: 471 R Axis:   -55 Text Interpretation:  Sinus tachycardia Biatrial enlargement Left axis deviation Abnormal ECG No STEMI.  Confirmed by Nanda Quinton [phone] on 04/04/2017 10:02:58 PM       Radiology Dg Chest 2 View  Result Date: 04/04/2017 CLINICAL DATA:  Acute onset of high blood pressure. Initial encounter. EXAM: CHEST  2 VIEW COMPARISON:  Chest radiograph and CTA of the chest performed 03/03/2012 FINDINGS: The lungs are well-aerated and clear. There is no evidence of focal opacification, pleural effusion or pneumothorax. The heart is borderline normal in size. No acute osseous abnormalities are seen. IMPRESSION: No acute cardiopulmonary process seen. Electronically Signed   By: Garald Balding M.D.   On: 04/04/2017 22:55    Procedures Procedures (including critical care time)  Medications Ordered in ED Medications  hydrALAZINE (APRESOLINE) injection 5 mg (not administered)  chlorthalidone (HYGROTON) tablet 25 mg (not administered)  nebivolol (BYSTOLIC) tablet 5 mg (not administered)  irbesartan  (AVAPRO) tablet 300 mg (not administered)  enoxaparin (LOVENOX) injection 40 mg (not administered)  acetaminophen (TYLENOL) tablet 650 mg (not administered)    Or  acetaminophen (TYLENOL) suppository 650 mg (not administered)  ondansetron (ZOFRAN) tablet 4 mg (not administered)    Or  ondansetron (ZOFRAN) injection 4 mg (not administered)  zolpidem (AMBIEN) tablet 5 mg (not administered)     Initial Impression / Assessment and Plan / ED Course  I have reviewed the triage vital signs and the nursing notes.  Pertinent labs & imaging results that were available during my care of the patient were reviewed by me and considered in my medical decision making (see chart for details).     Patient is a 41 y/o female with history of HTN,DM, obesity who presents from [facility] with concerns for hypertensive emergency. Patient has been out of blood pressure medications for the  past 3 weeks. On routine follow-up today, noted to have blood pressures in the 280K systolic and 349Z diastolic. She reported new onset of symptoms of dyspnea on exertion and transient headaches, which concerned her PCM for possible hypertensive emergency. Patient asymptomatic on arrival, hypertensive up to 202/140, mildly tachycardic, saturating well on room air.  Exam as above, significant for Clear lungs.  Labs stable. EKG without acute findings. Chest x-ray negative for acute findings. Patient asymptomatic and appearing well, however with persistently elevated blood pressures likely from her medication noncompliance. Patient reported main reason for noncompliance was that she was unable to afford her prescriptions.    Discussed with hospitalist for admission given her need for medication adjustment in order for her to afford her prescriptions, and blood pressure control. Patient currently asymptomatic with no evidence of hypertensive emergency/urgency at this time.  Patient in agreement with plan at time of admission.  Patient and  plan of care discussed with Attending physician, Dr. Laverta Baltimore.    Final Clinical Impressions(s) / ED Diagnoses   Final diagnoses:  Hypertension, unspecified type    New Prescriptions New Prescriptions   No medications on file     Arnetha Massy, MD 04/05/17 Joycelyn Das, MD 04/05/17 [phone]

## 2017-04-04 NOTE — H&P (Signed)
History and Physical    [name] ERX:[id number] DOB: 02/22/1976 DOA: 04/04/2017  Referring MD/NP/PA:   PCP: Janith Lima, MD   Patient coming from:  The patient is coming from home.  At baseline, pt is independent for most of ADL.   Chief Complaint: Elevated blood pressure and shortness of breath  HPI: [name] is a 41 y.o. female with medical history significant of hypertension, hyperlipidemia, diet-controlled diabetes, medication noncompliance, who presents with elevated blood pressure and shortness of breath.  Patient states that she could not afford her blood pressure medications, and has not taken  her blood pressure medications for more than 3 weeks. She developed shortness of breath on exertion, headache and elevated blood pressure. Patient does not have chest pain. No leg edema, PND or orthopnea. Patient does not have unilateral weakness, numbness or tingling to extremities. No nausea, vomiting, diarrhea, abdominal pain, symptoms of UTI. She went to her PCP today for refill, and was told her BP was too high and needed to come to ED for admission.   ED Course: pt was found to have blood pressure 1:30/1:30, tachycardia, tachypnea, O2 sat are 96% on room air, temperature normal, WBC 7.2, creatinine 1.09. Patient is placed on telemetry bed for observation.  Review of Systems:   General: no fevers, chills, no body weight gain, has fatigue HEENT: no blurry vision, hearing changes or sore throat Respiratory: has dyspnea, no coughing, wheezing CV: no chest pain, no palpitations GI: no nausea, vomiting, abdominal pain, diarrhea, constipation GU: no dysuria, burning on urination, increased urinary frequency, hematuria  Ext: no leg edema Neuro: no unilateral weakness, numbness, or tingling, no vision change or hearing loss Skin: no rash, no skin tear. MSK: No muscle spasm, no deformity, no limitation of range of movement in spin Heme: No easy bruising.  Travel history: No  recent long distant travel.  Allergy: No Known Allergies  Past Medical History:  Diagnosis Date  . Diabetes mellitus    HISTORY OF GESTATIONAL DIABETES  . Hypertension   . IUD    MIRENA INSERTED 01/24/11    Past Surgical History:  Procedure Laterality Date  . CESAREAN SECTION  2006    Social History:  reports that she has never smoked. She has never used smokeless tobacco. She reports that she does not drink alcohol or use drugs.  Family History:  Family History  Problem Relation Age of Onset  . Diabetes Mother   . Heart disease Mother   . Hypertension Maternal Aunt      Prior to Admission medications   Medication Sig Start Date End Date Taking? Authorizing Provider  Azilsartan Medoxomil (EDARBI) 80 MG TABS Take 1 tablet (80 mg total) by mouth daily. 10/25/16  Yes Janith Lima, MD  chlorthalidone (HYGROTON) 25 MG tablet Take 1 tablet (25 mg total) by mouth daily. 12/05/16  Yes Janith Lima, MD  levonorgestrel (MIRENA) 20 MCG/24HR IUD 1 Intra Uterine Device (1 each total) by Intrauterine route once. 01/04/11  Yes Janith Lima, MD  nebivolol (BYSTOLIC) 5 MG tablet Take 1 tablet (5 mg total) by mouth daily. 12/05/16  Yes Janith Lima, MD    Physical Exam: Vitals:   04/04/17 2156 04/04/17 2230 04/04/17 2315 04/04/17 2345  BP:  (!) 166/113 (!) 184/123 (!) 172/109  Pulse:  (!) 111 (!) 112 (!) 106  Resp:  (!) 28 (!) 22 (!) 24  Temp:      TempSrc:      SpO2:  98% 96% 98% 97%   General: Not in acute distress HEENT:       Eyes: PERRL, EOMI, no scleral icterus.       ENT: No discharge from the ears and nose, no pharynx injection, no tonsillar enlargement.        Neck: No JVD, no bruit, no mass felt. Heme: No neck lymph node enlargement. Cardiac: S1/S2, RRR, No murmurs, No gallops or rubs. Respiratory: No rales, wheezing, rhonchi or rubs. GI: Soft, nondistended, nontender, no rebound pain, no organomegaly, BS present. GU: No hematuria Ext: No pitting leg edema  bilaterally. 2+DP/PT pulse bilaterally. Musculoskeletal: No joint deformities, No joint redness or warmth, no limitation of ROM in spin. Skin: No rashes.  Neuro: Alert, oriented X3, cranial nerves II-XII grossly intact, moves all extremities normally. Psych: Patient is not psychotic, no suicidal or hemocidal ideation.  Labs on Admission: I have personally reviewed following labs and imaging studies  CBC:  Recent Labs Lab 04/04/17 1632  WBC 7.2  HGB 12.5  HCT 36.6  MCV 71.8*  PLT 741   Basic Metabolic Panel:  Recent Labs Lab 04/04/17 1632  NA 140  K 3.8  CL 107  CO2 27  GLUCOSE 174*  BUN 12  CREATININE 1.09*  CALCIUM 9.0   GFR: Estimated Creatinine Clearance: 79.2 mL/min (A) (by C-G formula based on SCr of 1.09 mg/dL (H)). Liver Function Tests:  Recent Labs Lab 04/04/17 1632  AST 25  ALT 16  ALKPHOS 80  BILITOT 0.7  PROT 7.3  ALBUMIN 3.5   No results for input(s): LIPASE, AMYLASE in the last 168 hours. No results for input(s): AMMONIA in the last 168 hours. Coagulation Profile: No results for input(s): INR, PROTIME in the last 168 hours. Cardiac Enzymes:  Recent Labs Lab 04/04/17 2350  TROPONINI <0.03   BNP (last 3 results) No results for input(s): PROBNP in the last 8760 hours. HbA1C: No results for input(s): HGBA1C in the last 72 hours. CBG: No results for input(s): GLUCAP in the last 168 hours. Lipid Profile: No results for input(s): CHOL, HDL, LDLCALC, TRIG, CHOLHDL, LDLDIRECT in the last 72 hours. Thyroid Function Tests: No results for input(s): TSH, T4TOTAL, FREET4, T3FREE, THYROIDAB in the last 72 hours. Anemia Panel: No results for input(s): VITAMINB12, FOLATE, FERRITIN, TIBC, IRON, RETICCTPCT in the last 72 hours. Urine analysis:    Component Value Date/Time   COLORURINE YELLOW 04/04/2017 2030   APPEARANCEUR CLOUDY (A) 04/04/2017 2030   LABSPEC 1.023 04/04/2017 2030   PHURINE 7.0 04/04/2017 2030   GLUCOSEU NEGATIVE 04/04/2017 2030     GLUCOSEU NEGATIVE 10/25/2016 1601   HGBUR NEGATIVE 04/04/2017 2030   [place] 04/04/2017 2030   [place] 04/04/2017 2030   PROTEINUR 100 (A) 04/04/2017 2030   UROBILINOGEN 0.2 10/25/2016 1601   NITRITE NEGATIVE 04/04/2017 2030   LEUKOCYTESUR MODERATE (A) 04/04/2017 2030   Sepsis Labs: @LABRCNTIP (procalcitonin:4,lacticidven:4) )No results found for this or any previous visit (from the past 240 hour(s)).   Radiological Exams on Admission: Dg Chest 2 View  Result Date: 04/04/2017 CLINICAL DATA:  Acute onset of high blood pressure. Initial encounter. EXAM: CHEST  2 VIEW COMPARISON:  Chest radiograph and CTA of the chest performed 03/03/2012 FINDINGS: The lungs are well-aerated and clear. There is no evidence of focal opacification, pleural effusion or pneumothorax. The heart is borderline normal in size. No acute osseous abnormalities are seen. IMPRESSION: No acute cardiopulmonary process seen. Electronically Signed   By: Garald Balding M.D.   On:  04/04/2017 22:55     EKG: Independently reviewed.  Sinus rhythm, QTC 471, diffused mild T-wave inversion, bilateral atrial enlargement.   Assessment/Plan Principal Problem:   Hypertensive urgency Active Problems:   Essential hypertension   Diabetes mellitus without complication (HCC)   Dyslipidemia, goal LDL below 100   Hypertensive urgency:Due to medication noncompliance. initial Bp 230/130. Patient had shortness breath and headache, which have resolved. Currently no chest pain or shortness of breath.  - Will place on tele bed for obs - resume home meds: Edarbi, chlorthalidone and Bystolic - prn IV hydralazine, the of goal of bp reduction is by about 25 to 30% tonight - trop x 3 -consult to CM for medication need   Diet controlled diabetes without complication: Last Y6R 6.9 on 10/25/16, well controled. Patient is not taking meds at home -Check A1c -check CBG every morning  Dyslipidemia, goal LDL below 100:  Last LDL was a 108 on 06/06/15. Patient is not taking medications at home. -Follow-up FLP   DVT ppx: SQ Lovenox Code Status: Full code Family Communication: None at bed side.     Disposition Plan:  Anticipate discharge back to previous home environment Consults called:  None Admission status: Obs / tele         Date of Service 04/05/2017    Ivor Costa [facility] Pager [phone]  If 7PM-7AM, please contact night-coverage [email or web address] Password [email or web address] 04/05/2017, 1:50 AM

## 2017-04-05 DIAGNOSIS — E785 Hyperlipidemia, unspecified: Secondary | ICD-10-CM | POA: Diagnosis not present

## 2017-04-05 DIAGNOSIS — E119 Type 2 diabetes mellitus without complications: Secondary | ICD-10-CM

## 2017-04-05 DIAGNOSIS — I10 Essential (primary) hypertension: Secondary | ICD-10-CM

## 2017-04-05 DIAGNOSIS — I160 Hypertensive urgency: Secondary | ICD-10-CM

## 2017-04-05 LAB — LIPID PANEL
CHOL/HDL RATIO: 3.1 ratio
Cholesterol: 201 mg/dL — ABNORMAL HIGH (ref 0–200)
HDL: 64 mg/dL (ref >40)
LDL Cholesterol: 117 mg/dL — ABNORMAL HIGH (ref 0–99)
Triglycerides: 99 mg/dL (ref <150)
VLDL: 20 mg/dL (ref 0–40)

## 2017-04-05 LAB — TROPONIN I: Troponin I: <0.03 ng/mL (ref <0.03)

## 2017-04-05 LAB — HIV ANTIBODY (ROUTINE TESTING W REFLEX): HIV SCREEN 4TH GENERATION: NONREACTIVE

## 2017-04-05 LAB — HCG, QUANTITATIVE, PREGNANCY: hCG, Beta Chain, Quant, S: 4 m[IU]/mL (ref <5)

## 2017-04-05 LAB — GLUCOSE, CAPILLARY: Glucose-Capillary: 106 mg/dL — ABNORMAL HIGH (ref 65–99)

## 2017-04-05 MED ORDER — SULFAMETHOXAZOLE-TRIMETHOPRIM 800-160 MG PO TABS: 1.0000 | ORAL_TABLET | Freq: Two times a day (BID) | ORAL | 0 refills | Status: AC

## 2017-04-05 MED ORDER — CARVEDILOL 6.25 MG PO TABS: 6.2500 mg | ORAL_TABLET | Freq: Two times a day (BID) | ORAL | 0 refills | Status: DC

## 2017-04-05 MED ORDER — LOSARTAN POTASSIUM-HCTZ 100-12.5 MG PO TABS
1.0000 | ORAL_TABLET | Freq: Every day | ORAL | 0 refills | Status: DC
Start: 2017-04-05 — End: 2017-05-15

## 2017-04-05 NOTE — Care Management Note (Deleted)
Case Management Note  Patient Details  Name: [name] MRN: [id number] Date of Birth: 01/17/1976  Subjective/Objective:                    Action/Plan:   Expected Discharge Date:                  Expected Discharge Plan:  Home/Self Care  In-House Referral:     Discharge planning Services  CM Consult, Medication Assistance  Post Acute Care Choice:  NA Choice offered to:  NA  DME Arranged:    DME Agency:     HH Arranged:    [place] Agency:     Status of Service:  Completed, signed off  If discussed at [employer] of Stay Meetings, dates discussed:    Additional Comments:  Dawayne Patricia, RN 04/05/2017, 11:44 AM

## 2017-04-05 NOTE — Discharge Summary (Signed)
Physician Discharge Summary  [name] DOB: Jan 07, 1976 DOA: 04/04/2017  PCP: Janith Lima, MD  Admit date: 04/04/2017 Discharge date: 04/05/2017  Admitted From: Home  Disposition: Home   Recommendations for Outpatient Follow-up:  1. Follow up with PCP in 1 weeks 2. Please obtain BMP/CBC in one week 3. Please follow up on the following pending results:final urine culture results  Discharge Condition: STABLE  CODE STATUS: FULL    Brief Hospitalization Summary: Please see all hospital notes, images, labs for full details of the hospitalization. HPI: [name] is a 41 y.o. female with medical history significant of hypertension, hyperlipidemia, diet-controlled diabetes, medication noncompliance, who presents with elevated blood pressure and shortness of breath.  Patient states that she could not afford her blood pressure medications, and has not taken  her blood pressure medications for more than 3 weeks. She developed shortness of breath on exertion, headache and elevated blood pressure. Patient does not have chest pain. No leg edema, PND or orthopnea. Patient does not have unilateral weakness, numbness or tingling to extremities. No nausea, vomiting, diarrhea, abdominal pain, symptoms of UTI. She went to her PCP today for refill, and was told her BP was too high and needed to come to ED for admission.   ED Course: pt was found to have blood pressure 1:30/1:30, tachycardia, tachypnea, O2 sat are 96% on room air, temperature normal, WBC 7.2, creatinine 1.09. Patient is placed on telemetry bed for observation.  Hypertensive urgency:Due to medication noncompliance. initial Bp 230/130. Patient had shortness breath and headache, which have resolved. Currently no chest pain or shortness of breath. Pt was monitored placed back on home meds with improvement in symptoms and blood pressure.  Pt can't afford her current home meds.  Will discharge on losartan HCT, carvedilol,  with close outpatient follow up and recommended she try [facility] for affordable prescriptions.   Pt to discuss with PCP.     Diet controlled diabetes without complication: Last I4P 6.9 on 10/25/16, well controled. Patient is not taking meds at home  Dyslipidemia, goal LDL below 100: Last LDL was a 108 on 06/06/15. Patient is not taking medications at home. -Follow-up FLP  UTI - discharge on oral bactrim DS, follow up with PCP for final culture results  DVT ppx: SQ Lovenox Code Status: Full code Family Communication: None at bed side.     Disposition Plan:  Anticipate discharge back to previous home environment Consults called:  None Admission status: Obs / tele   Discharge Diagnoses:  Principal Problem:   Hypertensive urgency Active Problems:   Essential hypertension   Diabetes mellitus without complication (HCC)   Dyslipidemia, goal LDL below 100  Discharge Instructions: Discharge Instructions    Call MD for:  difficulty breathing, headache or visual disturbances    Complete by:  As directed    Call MD for:  extreme fatigue    Complete by:  As directed    Call MD for:  persistant dizziness or light-headedness    Complete by:  As directed    Call MD for:  persistant nausea and vomiting    Complete by:  As directed    Call MD for:  severe uncontrolled pain    Complete by:  As directed    Diet - low sodium heart healthy    Complete by:  As directed      Allergies as of 04/05/2017   No Known Allergies     Medication List    STOP  taking these medications   Azilsartan Medoxomil 80 MG Tabs Commonly known as:  EDARBI   chlorthalidone 25 MG tablet Commonly known as:  HYGROTON   nebivolol 5 MG tablet Commonly known as:  BYSTOLIC     TAKE these medications   carvedilol 6.25 MG tablet Commonly known as:  COREG Take 1 tablet (6.25 mg total) by mouth 2 (two) times daily with a meal.   levonorgestrel 20 MCG/24HR IUD Commonly known as:  MIRENA 1 Intra Uterine  Device (1 each total) by Intrauterine route once.   losartan-hydrochlorothiazide 100-12.5 MG tablet Commonly known as:  HYZAAR Take 1 tablet by mouth daily.   sulfamethoxazole-trimethoprim 800-160 MG tablet Commonly known as:  BACTRIM DS,SEPTRA DS Take 1 tablet by mouth 2 (two) times daily.      Follow-up Information    Janith Lima, MD. Schedule an appointment as soon as possible for a visit in 5 day(s).   Specialty:  Internal Medicine Contact information: [address] [address] [phone]          No Known Allergies Current Discharge Medication List    START taking these medications   Details  carvedilol (COREG) 6.25 MG tablet Take 1 tablet (6.25 mg total) by mouth 2 (two) times daily with a meal. Qty: 60 tablet, Refills: 0    losartan-hydrochlorothiazide (HYZAAR) 100-12.5 MG tablet Take 1 tablet by mouth daily. Qty: 30 tablet, Refills: 0    sulfamethoxazole-trimethoprim (BACTRIM DS,SEPTRA DS) 800-160 MG tablet Take 1 tablet by mouth 2 (two) times daily. Qty: 10 tablet, Refills: 0      CONTINUE these medications which have NOT CHANGED   Details  levonorgestrel (MIRENA) 20 MCG/24HR IUD 1 Intra Uterine Device (1 each total) by Intrauterine route once. Qty: 1 each, Refills: 0      STOP taking these medications     Azilsartan Medoxomil (EDARBI) 80 MG TABS      chlorthalidone (HYGROTON) 25 MG tablet      nebivolol (BYSTOLIC) 5 MG tablet         Procedures/Studies: Dg Chest 2 View  Result Date: 04/04/2017 CLINICAL DATA:  Acute onset of high blood pressure. Initial encounter. EXAM: CHEST  2 VIEW COMPARISON:  Chest radiograph and CTA of the chest performed 03/03/2012 FINDINGS: The lungs are well-aerated and clear. There is no evidence of focal opacification, pleural effusion or pneumothorax. The heart is borderline normal in size. No acute osseous abnormalities are seen. IMPRESSION: No acute cardiopulmonary process seen.  Electronically Signed   By: Garald Balding M.D.   On: 04/04/2017 22:55     Subjective: Pt says she feels much better and asking to go home.    Discharge Exam: Vitals:   04/05/17 0400 04/05/17 0500  BP: (!) 160/114 (!) 173/141  Pulse: (!) 103 95  Resp: (!) 22 (!) 21  Temp:    SpO2: 95% 98%   Vitals:   04/05/17 0245 04/05/17 0400 04/05/17 0456 04/05/17 0500  BP: (!) 165/106 (!) 160/114  (!) 173/141  Pulse: (!) 102 (!) 103  95  Resp:  (!) 22  (!) 21  Temp:      TempSrc:      SpO2: 96% 95%  98%  Weight:   103.1 kg (227 lb 3.2 oz)   Height:   5\' 5"  (1.651 m)     General: Pt is alert, awake, not in acute distress Cardiovascular: RRR, S1/S2 +, no rubs, no gallops Respiratory: CTA bilaterally, no wheezing, no rhonchi  Abdominal: Soft, NT, ND, bowel sounds + Extremities: no edema, no cyanosis   The results of significant diagnostics from this hospitalization (including imaging, microbiology, ancillary and laboratory) are listed below for reference.     Microbiology: No results found for this or any previous visit (from the past 240 hour(s)).   Labs: BNP (last 3 results) No results for input(s): BNP in the last 8760 hours. Basic Metabolic Panel:  Recent Labs Lab 04/04/17 1632  NA 140  K 3.8  CL 107  CO2 27  GLUCOSE 174*  BUN 12  CREATININE 1.09*  CALCIUM 9.0   Liver Function Tests:  Recent Labs Lab 04/04/17 1632  AST 25  ALT 16  ALKPHOS 80  BILITOT 0.7  PROT 7.3  ALBUMIN 3.5   No results for input(s): LIPASE, AMYLASE in the last 168 hours. No results for input(s): AMMONIA in the last 168 hours. CBC:  Recent Labs Lab 04/04/17 1632  WBC 7.2  HGB 12.5  HCT 36.6  MCV 71.8*  PLT 198   Cardiac Enzymes:  Recent Labs Lab 04/04/17 2350 04/05/17 0536 04/05/17 1011  TROPONINI <0.03 <0.03 <0.03   BNP: Invalid input(s): POCBNP CBG:  Recent Labs Lab 04/05/17 0446  GLUCAP 106*   D-Dimer No results for input(s): DDIMER in the last 72  hours. Hgb A1c No results for input(s): HGBA1C in the last 72 hours. Lipid Profile  Recent Labs  04/05/17 0536  CHOL 201*  HDL 64  LDLCALC 117*  TRIG 99  CHOLHDL 3.1   Thyroid function studies No results for input(s): TSH, T4TOTAL, T3FREE, THYROIDAB in the last 72 hours.  Invalid input(s): FREET3 Anemia work up No results for input(s): VITAMINB12, FOLATE, FERRITIN, TIBC, IRON, RETICCTPCT in the last 72 hours. Urinalysis    Component Value Date/Time   COLORURINE YELLOW 04/04/2017 2030   APPEARANCEUR CLOUDY (A) 04/04/2017 2030   LABSPEC 1.023 04/04/2017 2030   PHURINE 7.0 04/04/2017 2030   GLUCOSEU NEGATIVE 04/04/2017 2030   GLUCOSEU NEGATIVE 10/25/2016 1601   HGBUR NEGATIVE 04/04/2017 2030   [place] 04/04/2017 2030   [place] 04/04/2017 2030   PROTEINUR 100 (A) 04/04/2017 2030   UROBILINOGEN 0.2 10/25/2016 1601   NITRITE NEGATIVE 04/04/2017 2030   LEUKOCYTESUR MODERATE (A) 04/04/2017 2030   Sepsis Labs Invalid input(s): PROCALCITONIN,  WBC,  LACTICIDVEN Microbiology No results found for this or any previous visit (from the past 240 hour(s)).  Time coordinating discharge:   SIGNED:  Irwin Brakeman, MD  [facility] 04/05/2017, 12:38 PM Pager [phone]  If 7PM-7AM, please contact night-coverage [email or web address] Password [email or web address]\

## 2017-04-05 NOTE — Discharge Instructions (Signed)
Follow with Primary MD  Jones, Thomas L, MD  and other consultant's as instructed your Hospitalist MD ° °Please get a complete blood count and chemistry panel checked by your Primary MD at your next visit, and again as instructed by your Primary MD. ° °Get Medicines reviewed and adjusted: °Please take all your medications with you for your next visit with your Primary MD ° °Laboratory/radiological data: °Please request your Primary MD to go over all hospital tests and procedure/radiological results at the follow up, please ask your Primary MD to get all Hospital records sent to his/her office. ° °In some cases, they will be blood work, cultures and biopsy results pending at the time of your discharge. Please request that your primary care M.D. follows up on these results. ° °Also Note the following: °If you experience worsening of your admission symptoms, develop shortness of breath, life threatening emergency, suicidal or homicidal thoughts you must seek medical attention immediately by calling 911 or calling your MD immediately  if symptoms less severe. ° °You must read complete instructions/literature along with all the possible adverse reactions/side effects for all the Medicines you take and that have been prescribed to you. Take any new Medicines after you have completely understood and accpet all the possible adverse reactions/side effects.  ° °Do not drive when taking Pain medications or sleeping medications (Benzodaizepines) ° °Do not take more than prescribed Pain, Sleep and Anxiety Medications. It is not advisable to combine anxiety,sleep and pain medications without talking with your primary care practitioner ° °Special Instructions: If you have smoked or chewed Tobacco  in the last 2 yrs please stop smoking, stop any regular Alcohol  and or any Recreational drug use. ° °Wear Seat belts while driving. ° °Please note: °You were cared for by a hospitalist during your hospital stay. Once you are discharged,  your primary care physician will handle any further medical issues. Please note that NO REFILLS for any discharge medications will be authorized once you are discharged, as it is imperative that you return to your primary care physician (or establish a relationship with a primary care physician if you do not have one) for your post hospital discharge needs so that they can reassess your need for medications and monitor your lab values. ° ° ° ° ° ° °

## 2017-04-05 NOTE — Progress Notes (Signed)
Order received to discharge patient.  Telemetry monitor removed and CCMD notified.  PIV access removed.  Discharge instructions, follow up, medications and instructions for their use were discussed with patient. 

## 2017-04-05 NOTE — Care Management Note (Signed)
Case Management Note Marvetta Gibbons RN, BSN Unit 4E-Case Manager [phone]  Patient Details  Name: [name] MRN: [id number] Date of Birth: 08/12/1976  Subjective/Objective:    Pt presented with HTN urgency- placed in observation                Action/Plan: PTA pt lived at home- independent- plan to return home- referral for medication needs-affordability- spoke with pt at bedside- per conversation pt states that she has high copay cost for her medications and she wants medications that are more affordable- explained to pt that she needs to have a conversation with the MD and her PCP regarding the fact that she has a hard time affording her BP medications and needs meds that are more affordable or generic. Pt to speak with MD when they round regarding her concerns.   Expected Discharge Date:                  Expected Discharge Plan:  Home/Self Care  In-House Referral:     Discharge planning Services  CM Consult, Medication Assistance  Post Acute Care Choice:  NA Choice offered to:  NA  DME Arranged:    DME Agency:     HH Arranged:    [place] Agency:     Status of Service:  Completed, signed off  If discussed at [place] of Stay Meetings, dates discussed:    Discharge Disposition: home/self care   Additional Comments:  Dawayne Patricia, RN 04/05/2017, 11:41 AM

## 2017-04-06 LAB — HEMOGLOBIN A1C
Hgb A1c MFr Bld: 6.6 % — ABNORMAL HIGH (ref 4.8–5.6)
Mean Plasma Glucose: 143 mg/dL

## 2017-04-09 ENCOUNTER — Inpatient Hospital Stay: Payer: 59 | Admitting: Internal Medicine

## 2017-05-15 ENCOUNTER — Ambulatory Visit (INDEPENDENT_AMBULATORY_CARE_PROVIDER_SITE_OTHER): Payer: 59 | Admitting: Internal Medicine

## 2017-05-15 ENCOUNTER — Encounter: Payer: Self-pay | Admitting: Internal Medicine

## 2017-05-15 VITALS — BP 184/132 | HR 87 | Temp 98.4°F | Resp 16 | Ht 65.0 in | Wt 227.0 lb

## 2017-05-15 DIAGNOSIS — E119 Type 2 diabetes mellitus without complications: Secondary | ICD-10-CM

## 2017-05-15 DIAGNOSIS — Z124 Encounter for screening for malignant neoplasm of cervix: Secondary | ICD-10-CM

## 2017-05-15 DIAGNOSIS — Z23 Encounter for immunization: Secondary | ICD-10-CM | POA: Diagnosis not present

## 2017-05-15 DIAGNOSIS — Z0000 Encounter for general adult medical examination without abnormal findings: Secondary | ICD-10-CM | POA: Insufficient documentation

## 2017-05-15 DIAGNOSIS — Z1231 Encounter for screening mammogram for malignant neoplasm of breast: Secondary | ICD-10-CM

## 2017-05-15 DIAGNOSIS — I10 Essential (primary) hypertension: Secondary | ICD-10-CM | POA: Diagnosis not present

## 2017-05-15 DIAGNOSIS — R0683 Snoring: Secondary | ICD-10-CM

## 2017-05-15 LAB — POCT GLYCOSYLATED HEMOGLOBIN (HGB A1C): HEMOGLOBIN A1C: 6.8

## 2017-05-15 MED ORDER — AZILSARTAN-CHLORTHALIDONE 40-25 MG PO TABS: 1.0000 | ORAL_TABLET | Freq: Every day | ORAL | 0 refills | Status: DC

## 2017-05-15 MED ORDER — NEBIVOLOL HCL 10 MG PO TABS: 10.0000 mg | ORAL_TABLET | Freq: Every day | ORAL | 0 refills | Status: DC

## 2017-05-15 NOTE — Patient Instructions (Signed)

## 2017-05-15 NOTE — Progress Notes (Signed)
Subjective:  Patient ID: [name], female    DOB: Jun 20, 1976  Age: 41 y.o. MRN: [id number]  CC: Hospitalization Follow-up (hospital follow up/ med follow up); Hypertension; and Diabetes   HPI La R Newhouse presents for a BP check - Her blood pressure has not been well controlled on the current regimen. She complains of snoring but doesn't think she has sleep apnea. She feels well today and offers no complaints.  Outpatient Medications Prior to Visit  Medication Sig Dispense Refill  . levonorgestrel (MIRENA) 20 MCG/24HR IUD 1 Intra Uterine Device (1 each total) by Intrauterine route once. 1 each 0  . carvedilol (COREG) 6.25 MG tablet Take 1 tablet (6.25 mg total) by mouth 2 (two) times daily with a meal. 60 tablet 0  . losartan-hydrochlorothiazide (HYZAAR) 100-12.5 MG tablet Take 1 tablet by mouth daily. 30 tablet 0   No facility-administered medications prior to visit.     ROS Review of Systems  Constitutional: Negative for appetite change, diaphoresis and fatigue.  HENT: Negative.  Negative for trouble swallowing.   Eyes: Negative for visual disturbance.  Respiratory: Negative for cough, chest tightness, shortness of breath and wheezing.   Cardiovascular: Negative for chest pain, palpitations and leg swelling.  Gastrointestinal: Negative for abdominal pain, constipation, diarrhea, nausea and vomiting.  Endocrine: Negative.   Genitourinary: Negative.  Negative for decreased urine volume, difficulty urinating, dysuria and urgency.  Musculoskeletal: Negative.  Negative for back pain and neck pain.  Skin: Negative.   Allergic/Immunologic: Negative.   Neurological: Negative.  Negative for dizziness, weakness and headaches.  Hematological: Negative for adenopathy. Does not bruise/bleed easily.  Psychiatric/Behavioral: Negative.     Objective:  BP (!) 184/132   Pulse 87   Temp 98.4 F (36.9 C)   Resp 16   Ht 5\' 5"  (1.651 m)   Wt 227 lb (103 kg)   SpO2 99%   BMI 37.77  kg/m   BP Readings from Last 3 Encounters:  05/15/17 (!) 184/132  04/05/17 (!) 173/141  04/04/17 (!) 230/130    Wt Readings from Last 3 Encounters:  05/15/17 227 lb (103 kg)  04/05/17 227 lb 3.2 oz (103.1 kg)  04/04/17 227 lb (103 kg)    Physical Exam  Constitutional: She is oriented to person, place, and time. No distress.  HENT:  Mouth/Throat: Oropharynx is clear and moist. No oropharyngeal exudate.  Eyes: Conjunctivae are normal. Right eye exhibits no discharge. Left eye exhibits no discharge. No scleral icterus.  Neck: Normal range of motion. Neck supple. No tracheal deviation present. No thyromegaly present.  Cardiovascular: Normal rate, regular rhythm and intact distal pulses.  Exam reveals no gallop and no friction rub.   No murmur heard. Pulmonary/Chest: Effort normal and breath sounds normal. No respiratory distress. She has no wheezes. She has no rales. She exhibits no tenderness.  Abdominal: Soft. Bowel sounds are normal. She exhibits no distension and no mass. There is no tenderness. There is no rebound and no guarding.  Musculoskeletal: Normal range of motion. She exhibits no edema, tenderness or deformity.  Lymphadenopathy:    She has no cervical adenopathy.  Neurological: She is alert and oriented to person, place, and time.  Skin: Skin is warm. No rash noted. She is not diaphoretic. No erythema. No pallor.  Psychiatric: She has a normal mood and affect. Her behavior is normal. Judgment and thought content normal.  Vitals reviewed.   Lab Results  Component Value Date   WBC 7.2 04/04/2017   HGB  12.5 04/04/2017   HCT 36.6 04/04/2017   PLT 198 04/04/2017   GLUCOSE 174 (H) 04/04/2017   CHOL 201 (H) 04/05/2017   TRIG 99 04/05/2017   HDL 64 04/05/2017   LDLCALC 117 (H) 04/05/2017   ALT 16 04/04/2017   AST 25 04/04/2017   NA 140 04/04/2017   K 3.8 04/04/2017   CL 107 04/04/2017   CREATININE 1.09 (H) 04/04/2017   BUN 12 04/04/2017   CO2 27 04/04/2017   TSH  1.31 10/25/2016   INR 1.1 09/14/2008   HGBA1C 6.8 05/15/2017   MICROALBUR 4.2 (H) 10/25/2016    Dg Chest 2 View  Result Date: 04/04/2017 CLINICAL DATA:  Acute onset of high blood pressure. Initial encounter. EXAM: CHEST  2 VIEW COMPARISON:  Chest radiograph and CTA of the chest performed 03/03/2012 FINDINGS: The lungs are well-aerated and clear. There is no evidence of focal opacification, pleural effusion or pneumothorax. The heart is borderline normal in size. No acute osseous abnormalities are seen. IMPRESSION: No acute cardiopulmonary process seen. Electronically Signed   By: Garald Balding M.D.   On: 04/04/2017 22:55    Assessment & Plan:   A[place]elle was seen today for hospitalization follow-up, hypertension and diabetes.  Diagnoses and all orders for this visit:  Snoring -     Ambulatory referral to Sleep Studies  Essential hypertension- her blood pressure is not adequately well controlled. Will upgrade to a more potent ARB, thiazide diuretic, and beta blocker. I've asked her to be screened for sleep apnea. -     Azilsartan-Chlorthalidone (EDARBYCLOR) 40-25 MG TABS; Take 1 tablet by mouth daily. -     nebivolol (BYSTOLIC) 10 MG tablet; Take 1 tablet (10 mg total) by mouth daily.  Visit for screening mammogram -     MM DIGITAL SCREENING BILATERAL; Future  Cervical cancer screening -     Ambulatory referral to Gynecology  Need for influenza vaccination -     Flu Vaccine QUAD 36+ mos IM  Need for Tdap vaccination -     Tdap vaccine greater than or equal to 7yo IM  Diabetes mellitus without complication ([place])- her G2R is 6.8%. Medical therapy is not indicated. She agrees to work on her lifestyle modifications. -     POCT glycosylated hemoglobin (Hb A1C)   I have discontinued Ms. Abram's losartan-hydrochlorothiazide and carvedilol. I am also having her start on Azilsartan-Chlorthalidone and nebivolol. Additionally, I am having her maintain her levonorgestrel.  Meds ordered  this encounter  Medications  . Azilsartan-Chlorthalidone (EDARBYCLOR) 40-25 MG TABS    Sig: Take 1 tablet by mouth daily.    Dispense:  42 tablet    Refill:  0  . nebivolol (BYSTOLIC) 10 MG tablet    Sig: Take 1 tablet (10 mg total) by mouth daily.    Dispense:  84 tablet    Refill:  0     Follow-up: Return in about 6 weeks (around 06/26/2017).  Scarlette Calico, MD

## 2017-06-20 ENCOUNTER — Encounter: Payer: Self-pay | Admitting: Internal Medicine

## 2017-06-27 ENCOUNTER — Encounter: Payer: Self-pay | Admitting: Internal Medicine

## 2017-06-27 ENCOUNTER — Other Ambulatory Visit (INDEPENDENT_AMBULATORY_CARE_PROVIDER_SITE_OTHER): Payer: 59

## 2017-06-27 ENCOUNTER — Ambulatory Visit: Payer: 59 | Admitting: Internal Medicine

## 2017-06-27 VITALS — BP 138/92 | HR 88 | Temp 97.9°F | Resp 16 | Ht 65.0 in | Wt 228.0 lb

## 2017-06-27 DIAGNOSIS — T502X5A Adverse effect of carbonic-anhydrase inhibitors, benzothiadiazides and other diuretics, initial encounter: Secondary | ICD-10-CM

## 2017-06-27 DIAGNOSIS — I10 Essential (primary) hypertension: Secondary | ICD-10-CM

## 2017-06-27 DIAGNOSIS — Z1231 Encounter for screening mammogram for malignant neoplasm of breast: Secondary | ICD-10-CM

## 2017-06-27 DIAGNOSIS — Z124 Encounter for screening for malignant neoplasm of cervix: Secondary | ICD-10-CM

## 2017-06-27 DIAGNOSIS — E876 Hypokalemia: Secondary | ICD-10-CM | POA: Diagnosis not present

## 2017-06-27 LAB — BASIC METABOLIC PANEL
BUN: 17 mg/dL (ref 6–23)
CO2: 33 mEq/L — ABNORMAL HIGH (ref 19–32)
CREATININE: 0.98 mg/dL (ref 0.40–1.20)
Calcium: 9.9 mg/dL (ref 8.4–10.5)
Chloride: 100 mEq/L (ref 96–112)
GFR: 79.51 mL/min (ref >60.00)
GLUCOSE: 105 mg/dL — AB (ref 70–99)
POTASSIUM: 3.4 meq/L — AB (ref 3.5–5.1)
Sodium: 138 mEq/L (ref 135–145)

## 2017-06-27 MED ORDER — AZILSARTAN-CHLORTHALIDONE 40-25 MG PO TABS: 1.0000 | ORAL_TABLET | Freq: Every day | ORAL | 1 refills | Status: DC

## 2017-06-27 MED ORDER — POTASSIUM CHLORIDE ER 10 MEQ PO TBCR: 10.0000 meq | EXTENDED_RELEASE_TABLET | Freq: Two times a day (BID) | ORAL | 1 refills | Status: DC

## 2017-06-27 NOTE — Patient Instructions (Signed)

## 2017-06-27 NOTE — Progress Notes (Signed)
Subjective:  Patient ID: [name], female    DOB: 1976/02/04  Age: 41 y.o. MRN: [id number]  CC: Hypertension   HPI [name] presents for a BP check - she is compliant with a 3 drug regimen.  She tells me her blood pressure at home has been well controlled.  She feels well today and offers no complaints.  Outpatient Medications Prior to Visit  Medication Sig Dispense Refill  . levonorgestrel (MIRENA) 20 MCG/24HR IUD 1 Intra Uterine Device (1 each total) by Intrauterine route once. 1 each 0  . nebivolol (BYSTOLIC) 10 MG tablet Take 1 tablet (10 mg total) by mouth daily. 84 tablet 0  . Azilsartan-Chlorthalidone (EDARBYCLOR) 40-25 MG TABS Take 1 tablet by mouth daily. 42 tablet 0   No facility-administered medications prior to visit.     ROS Review of Systems  Constitutional: Negative.  Negative for appetite change, diaphoresis, fatigue and unexpected weight change.  HENT: Negative.   Eyes: Negative for visual disturbance.  Respiratory: Negative for cough, chest tightness, shortness of breath and wheezing.   Cardiovascular: Negative.  Negative for chest pain, palpitations and leg swelling.  Gastrointestinal: Negative.  Negative for abdominal pain, constipation, diarrhea, nausea and vomiting.  Endocrine: Negative.   Genitourinary: Negative.  Negative for difficulty urinating.  Musculoskeletal: Negative.  Negative for back pain and neck pain.  Skin: Negative.   Neurological: Negative.  Negative for dizziness, weakness, light-headedness and numbness.  Hematological: Negative for adenopathy. Does not bruise/bleed easily.  Psychiatric/Behavioral: Negative.     Objective:  BP (!) 138/92 (BP Location: Left Arm, Patient Position: Sitting, Cuff Size: Large)   Pulse 88   Temp 97.9 F (36.6 C) (Oral)   Resp 16   Ht 5\' 5"  (1.651 m)   Wt 228 lb (103.4 kg)   SpO2 95%   BMI 37.94 kg/m   BP Readings from Last 3 Encounters:  06/27/17 (!) 138/92  05/15/17 (!) 184/132    04/05/17 (!) 173/141    Wt Readings from Last 3 Encounters:  06/27/17 228 lb (103.4 kg)  05/15/17 227 lb (103 kg)  04/05/17 227 lb 3.2 oz (103.1 kg)    Physical Exam  Constitutional: She is oriented to person, place, and time. No distress.  HENT:  Mouth/Throat: Oropharynx is clear and moist. No oropharyngeal exudate.  Eyes: Conjunctivae are normal. Right eye exhibits no discharge. Left eye exhibits no discharge. No scleral icterus.  Neck: Normal range of motion. Neck supple. No JVD present. No thyromegaly present.  Cardiovascular: Normal rate, regular rhythm and intact distal pulses. Exam reveals no gallop and no friction rub.  No murmur heard. Pulmonary/Chest: Effort normal and breath sounds normal. No respiratory distress. She has no wheezes. She has no rales. She exhibits no tenderness.  Abdominal: Soft. Bowel sounds are normal. She exhibits no distension and no mass. There is no tenderness. There is no rebound and no guarding.  Musculoskeletal: Normal range of motion. She exhibits no edema, tenderness or deformity.  Lymphadenopathy:    She has no cervical adenopathy.  Neurological: She is alert and oriented to person, place, and time.  Skin: Skin is warm and dry. No rash noted. She is not diaphoretic. No erythema. No pallor.  Vitals reviewed.   Lab Results  Component Value Date   WBC 7.2 04/04/2017   HGB 12.5 04/04/2017   HCT 36.6 04/04/2017   PLT 198 04/04/2017   GLUCOSE 105 (H) 06/27/2017   CHOL 201 (H) 04/05/2017   TRIG 99 04/05/2017  HDL 64 04/05/2017   LDLCALC 117 (H) 04/05/2017   ALT 16 04/04/2017   AST 25 04/04/2017   NA 138 06/27/2017   K 3.4 (L) 06/27/2017   CL 100 06/27/2017   CREATININE 0.98 06/27/2017   BUN 17 06/27/2017   CO2 33 (H) 06/27/2017   TSH 1.31 10/25/2016   INR 1.1 09/14/2008   HGBA1C 6.8 05/15/2017   MICROALBUR 4.2 (H) 10/25/2016    Dg Chest 2 View  Result Date: 04/04/2017 CLINICAL DATA:  Acute onset of high blood pressure. Initial  encounter. EXAM: CHEST  2 VIEW COMPARISON:  Chest radiograph and CTA of the chest performed 03/03/2012 FINDINGS: The lungs are well-aerated and clear. There is no evidence of focal opacification, pleural effusion or pneumothorax. The heart is borderline normal in size. No acute osseous abnormalities are seen. IMPRESSION: No acute cardiopulmonary process seen. Electronically Signed   By: Garald Balding M.D.   On: 04/04/2017 22:55    Assessment & Plan:   Venisha was seen today for hypertension.  Diagnoses and all orders for this visit:  Cervical cancer screening -     Ambulatory referral to Gynecology  Essential hypertension- Her blood pressure is well controlled on the 3 drug med regimen.  The thiazide diuretic has caused mild hypokalemia which I will treat with an oral supplement. -     Azilsartan-Chlorthalidone (EDARBYCLOR) 40-25 MG TABS; Take 1 tablet daily by mouth. -     Basic metabolic panel; Future  Visit for screening mammogram -     MM DIGITAL SCREENING BILATERAL; Future  Diuretic-induced hypokalemia -     potassium chloride (K-DUR) 10 MEQ tablet; Take 1 tablet (10 mEq total) 2 (two) times daily by mouth.   I have changed Donta R. Bamber's Azilsartan-Chlorthalidone. I am also having her start on potassium chloride. Additionally, I am having her maintain her levonorgestrel and nebivolol.  Meds ordered this encounter  Medications  . Azilsartan-Chlorthalidone (EDARBYCLOR) 40-25 MG TABS    Sig: Take 1 tablet daily by mouth.    Dispense:  90 tablet    Refill:  1  . potassium chloride (K-DUR) 10 MEQ tablet    Sig: Take 1 tablet (10 mEq total) 2 (two) times daily by mouth.    Dispense:  180 tablet    Refill:  1     Follow-up: Return in about 4 months (around 10/25/2017).  Scarlette Calico, MD

## 2017-07-04 ENCOUNTER — Telehealth: Payer: Self-pay | Admitting: Internal Medicine

## 2017-07-30 ENCOUNTER — Telehealth: Payer: Self-pay

## 2017-07-30 NOTE — Telephone Encounter (Signed)
I called pt. No answer, left a VM advising her that our office will be operating on a delay tomorrow and asked her to call us back during regular business hours. I will also send a message to her via mychart.

## 2017-07-31 ENCOUNTER — Institutional Professional Consult (permissible substitution): Payer: 59 | Admitting: Neurology

## 2017-07-31 NOTE — Telephone Encounter (Signed)
I called pt. Pt is agreeable to rescheduling her appt until 09/11/17 at 11:00am. Pt verbalized understanding of new appt date and time.

## 2017-08-02 ENCOUNTER — Telehealth: Payer: Self-pay | Admitting: Internal Medicine

## 2017-08-02 NOTE — Telephone Encounter (Signed)
Signed in error  °

## 2017-08-02 NOTE — Telephone Encounter (Signed)
Left detailed message for patient.   RE: the requested medications (carvedilol and hctz) was not on pt medication list. Also need to confirm pharmacy.

## 2017-08-02 NOTE — Telephone Encounter (Signed)
Copied from [place] [id number]. Topic: Quick Communication - See Telephone Encounter >> Aug 02, 2017  2:24 PM Ether Griffins B wrote: CRM for notification. See Telephone encounter for:  Pt needing refill on carvedilol and hydrochlorothiazide  08/02/17.

## 2017-08-05 ENCOUNTER — Encounter: Payer: Self-pay | Admitting: Women's Health

## 2017-08-05 ENCOUNTER — Other Ambulatory Visit: Payer: Self-pay | Admitting: Internal Medicine

## 2017-08-05 ENCOUNTER — Ambulatory Visit (INDEPENDENT_AMBULATORY_CARE_PROVIDER_SITE_OTHER): Payer: 59 | Admitting: Women's Health

## 2017-08-05 VITALS — BP 160/80 | Ht 65.0 in | Wt 235.0 lb

## 2017-08-05 DIAGNOSIS — N912 Amenorrhea, unspecified: Secondary | ICD-10-CM

## 2017-08-05 DIAGNOSIS — Z113 Encounter for screening for infections with a predominantly sexual mode of transmission: Secondary | ICD-10-CM | POA: Diagnosis not present

## 2017-08-05 DIAGNOSIS — Z01419 Encounter for gynecological examination (general) (routine) without abnormal findings: Secondary | ICD-10-CM | POA: Diagnosis not present

## 2017-08-05 DIAGNOSIS — I10 Essential (primary) hypertension: Secondary | ICD-10-CM

## 2017-08-05 LAB — HM PAP SMEAR

## 2017-08-05 MED ORDER — CARVEDILOL 6.25 MG PO TABS: 6.2500 mg | ORAL_TABLET | Freq: Two times a day (BID) | ORAL | 1 refills | Status: DC

## 2017-08-05 MED ORDER — TELMISARTAN 40 MG PO TABS: 40.0000 mg | ORAL_TABLET | Freq: Every day | ORAL | 1 refills | Status: DC

## 2017-08-05 MED ORDER — CHLORTHALIDONE 25 MG PO TABS: 25.0000 mg | ORAL_TABLET | Freq: Every day | ORAL | 1 refills | Status: DC

## 2017-08-05 NOTE — Telephone Encounter (Signed)
Darl Householder, RMA 08/05/2017 08:41 AM  Patient returned call to confirm pharmacy: [facility] [address], pt states these medications (Carvedilol, HCTZ) was prescribed at hospital and she needs a refill sent to Pharmacy

## 2017-08-05 NOTE — Telephone Encounter (Signed)
Tried to call pt, LVM for pt to call back as soon as possible.   RE: No recent admission. Carvedilol and HCTZ not on medication list.   Forwarding to PCP for review.

## 2017-08-05 NOTE — Addendum Note (Signed)
Addended by: Lorine Bears on: 08/05/2017 01:54 PM   Modules accepted: Orders

## 2017-08-05 NOTE — Telephone Encounter (Signed)
Contacted pt and she stated that she was in the hospital in Aug. Informed pt that the carvedilol and the HCTZ was dc'ed and when the were dc'ed. Also informed pt what BP medications she is to be taking.   Pt stated that the Edarbychlor is too expensive. Is there an alternative. [facility]: [facility] [address]

## 2017-08-05 NOTE — Progress Notes (Signed)
[name] 05/16/1976 [id number]    History:    Presents for new established patient annual exam.  Last annual exam - 2012, Dr. Phineas Real placed Mirena IUD has been mostly amenorrheic last cycle 6 weeks ago. New partner. Reports normal Pap history, last Pap 2012. Has not had a screening mammogram is scheduled this week. Primary care manages diabetes, hypertension and labs. GGA delivered both sons.  Past medical history, past surgical history, family history and social history were all reviewed and documented in the EPIC chart. 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 2 sons ages 43 and 76 both doing well. Mother diabetes and hypertension.  ROS:  A ROS was performed and pertinent positives and negatives are included.  Exam:  Vitals:   08/05/17 1201  BP: (!) 160/80  Weight: 235 lb (106.6 kg)  Height: 5\' 5"  (1.651 m)   Body mass index is 39.11 kg/m.   General appearance:  Normal Thyroid:  Symmetrical, normal in size, without palpable masses or nodularity. Respiratory  Auscultation:  Clear without wheezing or rhonchi Cardiovascular  Auscultation:  Regular rate, without rubs, murmurs or gallops  Edema/varicosities:  Not grossly evident Abdominal  Soft,nontender, without masses, guarding or rebound.  Liver/spleen:  No organomegaly noted  Hernia:  None appreciated  Skin  Inspection:  Grossly normal   Breasts: Examined lying and sitting.     Right: Without masses, retractions, discharge or axillary adenopathy.     Left: Without masses, retractions, discharge or axillary adenopathy. Gentitourinary   Inguinal/mons:  Normal without inguinal adenopathy  External genitalia:  Normal  BUS/Urethra/Skene's glands:  Normal  Vagina:  Normal  Cervix:  Normal IUD strings not visible  Uterus:   normal in size, shape and contour.  Midline and mobile  Adnexa/parametria:     Rt: Without masses or tenderness.   Lt: Without masses or tenderness.  Anus and perineum: Normal  Digital rectal exam: Normal sphincter  tone without palpated masses or tenderness  Assessment/Plan:  41 y.o. SBF G2 P2  for annual exam with no complaints, states made appointment because primary care Dr. Insisted.  2012 Mirena IUD amenorrhea STD screen Hypertension, diabetes, hypercholesteremia-primary care manages labs and meds Obesity  Plan: Reviewed blood pressure elevated does have scheduled follow-up with primary care. Will check insurance coverage, will check qualitative hCG, abstain until IUD removed and replaced per Dr Phineas Real. SBE's, keep scheduled mammogram appointment reviewed importance of annual screen. Increase exercise, decrease calories/carbs for weight loss encouraged. Pap with HR HPV typing, GC/Chlamydia, HIV, hep B, C, RPR.  [place], 12:44 PM 08/05/2017

## 2017-08-05 NOTE — Telephone Encounter (Signed)
RXs sent.

## 2017-08-05 NOTE — Patient Instructions (Addendum)
gardasil vaccine for  sons  DASH Eating Plan DASH stands for "Dietary Approaches to Stop Hypertension." The DASH eating plan is a healthy eating plan that has been shown to reduce high blood pressure (hypertension). It may also reduce your risk for type 2 diabetes, heart disease, and stroke. The DASH eating plan may also help with weight loss. What are tips for following this plan? General guidelines  Avoid eating more than 2,300 mg (milligrams) of salt (sodium) a day. If you have hypertension, you may need to reduce your sodium intake to 1,500 mg a day.  Limit alcohol intake to no more than 1 drink a day for nonpregnant women and 2 drinks a day for men. One drink equals 12 oz of beer, 5 oz of wine, or 1 oz of hard liquor.  Work with your health care provider to maintain a healthy body weight or to lose weight. Ask what an ideal weight is for you.  Get at least 30 minutes of exercise that causes your heart to beat faster (aerobic exercise) most days of the week. Activities may include walking, swimming, or biking.  Work with your health care provider or diet and nutrition specialist (dietitian) to adjust your eating plan to your individual calorie needs. Reading food labels  Check food labels for the amount of sodium per serving. Choose foods with less than 5 percent of the Daily Value of sodium. Generally, foods with less than 300 mg of sodium per serving fit into this eating plan.  To find whole grains, look for the word "whole" as the first word in the ingredient list. Shopping  Buy products labeled as "low-sodium" or "no salt added."  Buy fresh foods. Avoid canned foods and premade or frozen meals. Cooking  Avoid adding salt when cooking. Use salt-free seasonings or herbs instead of table salt or sea salt. Check with your health care provider or pharmacist before using salt substitutes.  Do not fry foods. Cook foods using healthy methods such as baking, boiling, grilling, and  broiling instead.  Cook with heart-healthy oils, such as olive, canola, soybean, or sunflower oil. Meal planning   Eat a balanced diet that includes: ? 5 or more servings of fruits and vegetables each day. At each meal, try to fill half of your plate with fruits and vegetables. ? Up to 6-8 servings of whole grains each day. ? Less than 6 oz of lean meat, poultry, or fish each day. A 3-oz serving of meat is about the same size as a deck of cards. One egg equals 1 oz. ? 2 servings of low-fat dairy each day. ? A serving of nuts, seeds, or beans 5 times each week. ? Heart-healthy fats. Healthy fats called Omega-3 fatty acids are found in foods such as flaxseeds and coldwater fish, like sardines, salmon, and mackerel.  Limit how much you eat of the following: ? Canned or prepackaged foods. ? Food that is high in trans fat, such as fried foods. ? Food that is high in saturated fat, such as fatty meat. ? Sweets, desserts, sugary drinks, and other foods with added sugar. ? Full-fat dairy products.  Do not salt foods before eating.  Try to eat at least 2 vegetarian meals each week.  Eat more home-cooked food and less restaurant, buffet, and fast food.  When eating at a restaurant, ask that your food be prepared with less salt or no salt, if possible. What foods are recommended? The items listed may not be a complete list.  Talk with your dietitian about what dietary choices are best for you. Grains Whole-grain or whole-wheat bread. Whole-grain or whole-wheat pasta. Brown rice. [place]. Bulgur. Whole-grain and low-sodium cereals. Pita bread. Low-fat, low-sodium crackers. Whole-wheat flour tortillas. Vegetables Fresh or frozen vegetables (raw, steamed, roasted, or grilled). Low-sodium or reduced-sodium tomato and vegetable juice. Low-sodium or reduced-sodium tomato sauce and tomato paste. Low-sodium or reduced-sodium canned vegetables. Fruits All fresh, dried, or frozen fruit. Canned  fruit in natural juice (without added sugar). Meat and other protein foods Skinless chicken or [place]. Ground chicken or [place]. Pork with fat trimmed off. Fish and seafood. Egg whites. Dried beans, peas, or lentils. Unsalted nuts, nut butters, and seeds. Unsalted canned beans. Lean cuts of beef with fat trimmed off. Low-sodium, lean deli meat. Dairy Low-fat (1%) or fat-free (skim) milk. Fat-free, low-fat, or reduced-fat cheeses. Nonfat, low-sodium ricotta or cottage cheese. Low-fat or nonfat yogurt. Low-fat, low-sodium cheese. Fats and oils Soft margarine without trans fats. Vegetable oil. Low-fat, reduced-fat, or light mayonnaise and salad dressings (reduced-sodium). Canola, safflower, olive, soybean, and sunflower oils. Avocado. Seasoning and other foods Herbs. Spices. Seasoning mixes without salt. Unsalted popcorn and pretzels. Fat-free sweets. What foods are not recommended? The items listed may not be a complete list. Talk with your dietitian about what dietary choices are best for you. Grains Baked goods made with fat, such as croissants, muffins, or some breads. Dry pasta or rice meal packs. Vegetables Creamed or fried vegetables. Vegetables in a cheese sauce. Regular canned vegetables (not low-sodium or reduced-sodium). Regular canned tomato sauce and paste (not low-sodium or reduced-sodium). Regular tomato and vegetable juice (not low-sodium or reduced-sodium). Angie Fava. Olives. Fruits Canned fruit in a light or heavy syrup. Fried fruit. Fruit in cream or butter sauce. Meat and other protein foods Fatty cuts of meat. Ribs. Fried meat. Berniece Salines. Sausage. Bologna and other processed lunch meats. Salami. Fatback. Hotdogs. Bratwurst. Salted nuts and seeds. Canned beans with added salt. Canned or smoked fish. Whole eggs or egg yolks. Chicken or [place] with skin. Dairy Whole or 2% milk, cream, and half-and-half. Whole or full-fat cream cheese. Whole-fat or sweetened yogurt. Full-fat cheese.  Nondairy creamers. Whipped toppings. Processed cheese and cheese spreads. Fats and oils Butter. Stick margarine. Lard. Shortening. Ghee. Bacon fat. Tropical oils, such as coconut, palm kernel, or palm oil. Seasoning and other foods Salted popcorn and pretzels. Onion salt, garlic salt, seasoned salt, table salt, and sea salt. Worcestershire sauce. Tartar sauce. Barbecue sauce. Teriyaki sauce. Soy sauce, including reduced-sodium. Steak sauce. Canned and packaged gravies. Fish sauce. Oyster sauce. Cocktail sauce. Horseradish that you find on the shelf. Ketchup. Mustard. Meat flavorings and tenderizers. Bouillon cubes. Hot sauce and Tabasco sauce. Premade or packaged marinades. Premade or packaged taco seasonings. Relishes. Regular salad dressings. Where to find more information:  National Heart, Lung, and [place]: https://wilson-eaton.com/  American Heart Association: www.heart.org Summary  The DASH eating plan is a healthy eating plan that has been shown to reduce high blood pressure (hypertension). It may also reduce your risk for type 2 diabetes, heart disease, and stroke.  With the DASH eating plan, you should limit salt (sodium) intake to 2,300 mg a day. If you have hypertension, you may need to reduce your sodium intake to 1,500 mg a day.  When on the DASH eating plan, aim to eat more fresh fruits and vegetables, whole grains, lean proteins, low-fat dairy, and heart-healthy fats.  Work with your health care provider or diet and nutrition specialist (dietitian) to adjust  your eating plan to your individual calorie needs. This information is not intended to replace advice given to you by your health care provider. Make sure you discuss any questions you have with your health care provider. Document Released: 07/26/2011 Document Revised: 07/30/2016 Document Reviewed: 07/30/2016 Elsevier Interactive Patient Education  2017 [place] for Diabetes Mellitus,  Adult Carbohydrate counting is a method for keeping track of how many carbohydrates you eat. Eating carbohydrates naturally increases the amount of sugar (glucose) in the blood. Counting how many carbohydrates you eat helps keep your blood glucose within normal limits, which helps you manage your diabetes (diabetes mellitus). It is important to know how many carbohydrates you can safely have in each meal. This is different for every person. A diet and nutrition specialist (registered dietitian) can help you make a meal plan and calculate how many carbohydrates you should have at each meal and snack. Carbohydrates are found in the following foods:  Grains, such as breads and cereals.  Dried beans and soy products.  Starchy vegetables, such as potatoes, peas, and corn.  Fruit and fruit juices.  Milk and yogurt.  Sweets and snack foods, such as cake, cookies, candy, chips, and soft drinks.  How do I count carbohydrates? There are two ways to count carbohydrates in food. You can use either of the methods or a combination of both. Reading "Nutrition Facts" on packaged food The "Nutrition Facts" list is included on the labels of almost all packaged foods and beverages in the [place] It includes:  The serving size.  Information about nutrients in each serving, including the grams (g) of carbohydrate per serving.  To use the "Nutrition Facts":  Decide how many servings you will have.  Multiply the number of servings by the number of carbohydrates per serving.  The resulting number is the total amount of carbohydrates that you will be having.  Learning standard serving sizes of other foods When you eat foods containing carbohydrates that are not packaged or do not include "Nutrition Facts" on the label, you need to measure the servings in order to count the amount of carbohydrates:  Measure the foods that you will eat with a food scale or measuring cup, if needed.  Decide how many  standard-size servings you will eat.  Multiply the number of servings by 15. Most carbohydrate-rich foods have about 15 g of carbohydrates per serving. ? For example, if you eat 8 oz (170 g) of strawberries, you will have eaten 2 servings and 30 g of carbohydrates (2 servings x 15 g = 30 g).  For foods that have more than one food mixed, such as soups and casseroles, you must count the carbohydrates in each food that is included.  The following list contains standard serving sizes of common carbohydrate-rich foods. Each of these servings has about 15 g of carbohydrates:   hamburger bun or  English muffin.   oz (15 mL) syrup.   oz (14 g) jelly.  1 slice of bread.  1 six-inch tortilla.  3 oz (85 g) cooked rice or pasta.  4 oz (113 g) cooked dried beans.  4 oz (113 g) starchy vegetable, such as peas, corn, or potatoes.  4 oz (113 g) hot cereal.  4 oz (113 g) mashed potatoes or  of a large baked potato.  4 oz (113 g) canned or frozen fruit.  4 oz (120 mL) fruit juice.  4-6 crackers.  6 chicken nuggets.  6 oz (170 g) unsweetened  dry cereal.  6 oz (170 g) plain fat-free yogurt or yogurt sweetened with artificial sweeteners.  8 oz (240 mL) milk.  8 oz (170 g) fresh fruit or one small piece of fruit.  24 oz (680 g) popped popcorn.  Example of carbohydrate counting Sample meal  3 oz (85 g) chicken breast.  6 oz (170 g) brown rice.  4 oz (113 g) corn.  8 oz (240 mL) milk.  8 oz (170 g) strawberries with sugar-free whipped topping. Carbohydrate calculation 1. Identify the foods that contain carbohydrates: ? Rice. ? Corn. ? Milk. ? Strawberries. 2. Calculate how many servings you have of each food: ? 2 servings rice. ? 1 serving corn. ? 1 serving milk. ? 1 serving strawberries. 3. Multiply each number of servings by 15 g: ? 2 servings rice x 15 g = 30 g. ? 1 serving corn x 15 g = 15 g. ? 1 serving milk x 15 g = 15 g. ? 1 serving strawberries x 15 g =  15 g. 4. Add together all of the amounts to find the total grams of carbohydrates eaten: ? 30 g + 15 g + 15 g + 15 g = 75 g of carbohydrates total. This information is not intended to replace advice given to you by your health care provider. Make sure you discuss any questions you have with your health care provider. Document Released: 08/06/2005 Document Revised: 02/24/2016 Document Reviewed: 01/18/2016 Elsevier Interactive Patient Education  2018 Elsevier Inc. Human Papillomavirus Quadrivalent Vaccine suspension for injection What is this medicine? HUMAN PAPILLOMAVIRUS VACCINE (HYOO muhn pap uh LOH muh vahy ruhs vak SEEN) is a vaccine. It is used to prevent infections of four types of the human papillomavirus. In women, the vaccine may lower your risk of getting cervical, vaginal, vulvar, or anal cancer and genital warts. In men, the vaccine may lower your risk of getting genital warts and anal cancer. You cannot get these diseases from the vaccine. This vaccine does not treat these diseases. This medicine may be used for other purposes; ask your health care provider or pharmacist if you have questions. COMMON BRAND NAME(S): Gardasil What should I tell my health care provider before I take this medicine? They need to know if you have any of these conditions: -fever or infection -hemophilia -HIV infection or AIDS -immune system problems -low platelet count -an unusual reaction to Human Papillomavirus Vaccine, yeast, other medicines, foods, dyes, or preservatives -pregnant or trying to get pregnant -breast-feeding How should I use this medicine? This vaccine is for injection in a muscle on your upper arm or thigh. It is given by a health care professional. Dennis Bast will be observed for 15 minutes after each dose. Sometimes, fainting happens after the vaccine is given. You may be asked to sit or lie down during the 15 minutes. Three doses are given. The second dose is given 2 months after the first  dose. The last dose is given 4 months after the second dose. A copy of a Vaccine Information Statement will be given before each vaccination. Read this sheet carefully each time. The sheet may change frequently. Talk to your pediatrician regarding the use of this medicine in children. While this drug may be prescribed for children as Sayge Brienza as 43 years of age for selected conditions, precautions do apply. Overdosage: If you think you have taken too much of this medicine contact a poison control center or emergency room at once. NOTE: This medicine is only for you.  Do not share this medicine with others. What if I miss a dose? All 3 doses of the vaccine should be given within 6 months. Remember to keep appointments for follow-up doses. Your health care provider will tell you when to return for the next vaccine. Ask your health care professional for advice if you are unable to keep an appointment or miss a scheduled dose. What may interact with this medicine? -other vaccines This list may not describe all possible interactions. Give your health care provider a list of all the medicines, herbs, non-prescription drugs, or dietary supplements you use. Also tell them if you smoke, drink alcohol, or use illegal drugs. Some items may interact with your medicine. What should I watch for while using this medicine? This vaccine may not fully protect everyone. Continue to have regular pelvic exams and cervical or anal cancer screenings as directed by your doctor. The Human Papillomavirus is a sexually transmitted disease. It can be passed by any kind of sexual activity that involves genital contact. The vaccine works best when given before you have any contact with the virus. Many people who have the virus do not have any signs or symptoms. Tell your doctor or health care professional if you have any reaction or unusual symptom after getting the vaccine. What side effects may I notice from receiving this  medicine? Side effects that you should report to your doctor or health care professional as soon as possible: -allergic reactions like skin rash, itching or hives, swelling of the face, lips, or tongue -breathing problems -feeling faint or lightheaded, falls Side effects that usually do not require medical attention (report to your doctor or health care professional if they continue or are bothersome): -cough -dizziness -fever -headache -nausea -redness, warmth, swelling, pain, or itching at site where injected This list may not describe all possible side effects. Call your doctor for medical advice about side effects. You may report side effects to FDA at 1-800-FDA-1088. Where should I keep my medicine? This drug is given in a hospital or clinic and will not be stored at home. NOTE: This sheet is a summary. It may not cover all possible information. If you have questions about this medicine, talk to your doctor, pharmacist, or health care provider.  2018 Elsevier/Gold Standard (2013-09-28 13:14:33)

## 2017-08-06 LAB — RPR: RPR: NONREACTIVE

## 2017-08-06 LAB — HEPATITIS B SURFACE ANTIGEN: HEP B S AG: NONREACTIVE

## 2017-08-06 LAB — HCG, SERUM, QUALITATIVE: PREG SERUM: NEGATIVE

## 2017-08-06 LAB — HEPATITIS C ANTIBODY
Hepatitis C Ab: NONREACTIVE
SIGNAL TO CUT-OFF: 0.03 (ref <1.00)

## 2017-08-06 LAB — C. TRACHOMATIS/N. GONORRHOEAE RNA
C. trachomatis RNA, TMA: NOT DETECTED
N. gonorrhoeae RNA, TMA: NOT DETECTED

## 2017-08-06 LAB — HIV ANTIBODY (ROUTINE TESTING W REFLEX): HIV: NONREACTIVE

## 2017-08-06 NOTE — Telephone Encounter (Addendum)
Called pt and forwarded to vm. Left detailed message for pt.   RE: PCP dc'ed the nebivolol and Edarbychlor. PCP changed them to telmisartan chlorthalidone and carvedilol.

## 2017-08-07 ENCOUNTER — Ambulatory Visit
Admission: RE | Admit: 2017-08-07 | Discharge: 2017-08-07 | Disposition: A | Discharge status: Home / Self Care | Payer: 59 | Source: Ambulatory Visit | Attending: Internal Medicine | Admitting: Internal Medicine

## 2017-08-07 DIAGNOSIS — Z1231 Encounter for screening mammogram for malignant neoplasm of breast: Secondary | ICD-10-CM

## 2017-08-07 LAB — HM MAMMOGRAPHY

## 2017-08-08 ENCOUNTER — Other Ambulatory Visit: Payer: Self-pay | Admitting: Women's Health

## 2017-08-08 LAB — PAP, TP IMAGING W/ HPV RNA, RFLX HPV TYPE 16,18/45: HPV DNA High Risk: NOT DETECTED

## 2017-08-08 MED ORDER — METRONIDAZOLE 500 MG PO TABS: 500.0000 mg | ORAL_TABLET | Freq: Two times a day (BID) | ORAL | 0 refills | Status: DC

## 2017-09-11 ENCOUNTER — Ambulatory Visit: Payer: 59 | Admitting: Gynecology

## 2017-09-11 ENCOUNTER — Institutional Professional Consult (permissible substitution): Payer: Self-pay | Admitting: Neurology

## 2017-09-11 NOTE — Telephone Encounter (Signed)
Please follow dismissal protocol as per our No Show Policy for new patient referrals.

## 2017-09-11 NOTE — Telephone Encounter (Signed)
Pt did not show for their appt with Dr. Rexene Alberts today.  This is pt's 2nd new patient no show.   Will send to Dr. Rexene Alberts and Janace Hoard for review.

## 2017-09-12 ENCOUNTER — Encounter: Payer: Self-pay | Admitting: Neurology

## 2017-10-16 ENCOUNTER — Ambulatory Visit: Payer: 59 | Admitting: Gynecology

## 2017-11-04 ENCOUNTER — Encounter: Payer: Self-pay | Admitting: Gynecology

## 2017-11-04 ENCOUNTER — Ambulatory Visit (INDEPENDENT_AMBULATORY_CARE_PROVIDER_SITE_OTHER): Payer: 59 | Admitting: Gynecology

## 2017-11-04 VITALS — BP 160/100

## 2017-11-04 DIAGNOSIS — T839XXA Unspecified complication of genitourinary prosthetic device, implant and graft, initial encounter: Secondary | ICD-10-CM

## 2017-11-04 DIAGNOSIS — N912 Amenorrhea, unspecified: Secondary | ICD-10-CM | POA: Diagnosis not present

## 2017-11-04 DIAGNOSIS — Z30432 Encounter for removal of intrauterine contraceptive device: Secondary | ICD-10-CM | POA: Diagnosis not present

## 2017-11-04 DIAGNOSIS — T8332XA Displacement of intrauterine contraceptive device, initial encounter: Secondary | ICD-10-CM

## 2017-11-04 LAB — PREGNANCY, URINE: Preg Test, Ur: NEGATIVE

## 2017-11-04 NOTE — Progress Notes (Signed)
    [name] Sep 27, 1975 [id number]        42 y.o.  [id number] presents for Mirena IUD replacement.  Was due to have replaced last year but did not follow-up for this.  Has abstained from intercourse over the last several weeks and has a negative UPT today.  With exam 07/2017 by Izora Gala she was unable to visualize the string.  Patient without menses.  The patient and I discussed removal and replacement of the Mirena IUD.  The procedure involved and the risks to include infection either immediate or long-term, perforation or migration requiring surgery to remove, hormonal absorption side effects and failure with subsequent pregnancy.  The patient read over and signed the consent form.  Past medical history,surgical history, problem list, medications, allergies, family history and social history were all reviewed and documented in the EPIC chart.  Directed ROS with pertinent positives and negatives documented in the history of present illness/assessment and plan.  Exam: Caryn Bee assistant Vitals:   11/04/17 1138  BP: (!) 160/100   General appearance:  Normal Abdomen soft nontender without masses guarding rebound Pelvic external BUS vagina normal.  Cervix normal.  IUD string not visualized.  Uterus normal size midline mobile nontender.  Adnexa without masses or tenderness.  Procedure: Initial attempts to retrieve the IUD with the [facility] forcep within the endocervical canal was unsuccessful.  Subsequently a paracervical block was placed using 1% lidocaine, 8 cc total after cleansing the cervix with Betadine for patient comfort.  Single-tooth tenaculum anterior lip stabilization.  Subsequently passed the IUD hook into the endometrial cavity which sounded approximately 8 cm and I was unable to retrieve or palpate the IUD with multiple passes.  Assessment/Plan:  42 y.o. [id number] with unsuccessful removal of IUD.  I was unable to palpate it with movement of the IUD hook.  Reviewed with patient  either IUD is in place and I cannot feel it, it's embedded/myometrial or she may have passed it unnoticed although she is not having periods and unlikely she would not have noticed passing it.  Recommended ultrasound for location and assuming found within the endometrial cavity then plan ultrasound-guided IUD retrieval and replacement.  She agrees with scheduling.  I did ask her to abstain from intercourse until this is accomplished and she agrees to do so.  Pressure 160/100.  Followed for hypertension.  Without signs or symptoms such as headaches or visual changes.  Will follow up with primary if continues elevated.    Anastasio Auerbach MD, 12:11 PM 11/04/2017

## 2017-11-04 NOTE — Patient Instructions (Signed)
Follow-up for ultrasound and IUD removal

## 2017-11-04 NOTE — Addendum Note (Signed)
Addended by: Nelva Nay on: 11/04/2017 12:31 PM   Modules accepted: Orders

## 2017-11-26 ENCOUNTER — Other Ambulatory Visit: Payer: Self-pay | Admitting: Gynecology

## 2017-11-26 DIAGNOSIS — T8339XD Other mechanical complication of intrauterine contraceptive device, subsequent encounter: Secondary | ICD-10-CM

## 2017-11-26 DIAGNOSIS — T8332XD Displacement of intrauterine contraceptive device, subsequent encounter: Secondary | ICD-10-CM

## 2017-11-27 ENCOUNTER — Ambulatory Visit (INDEPENDENT_AMBULATORY_CARE_PROVIDER_SITE_OTHER): Payer: 59 | Admitting: Gynecology

## 2017-11-27 ENCOUNTER — Other Ambulatory Visit: Payer: Self-pay | Admitting: Gynecology

## 2017-11-27 ENCOUNTER — Ambulatory Visit (INDEPENDENT_AMBULATORY_CARE_PROVIDER_SITE_OTHER): Payer: 59

## 2017-11-27 ENCOUNTER — Encounter: Payer: Self-pay | Admitting: Gynecology

## 2017-11-27 VITALS — BP 124/80

## 2017-11-27 DIAGNOSIS — T8389XA Other specified complication of genitourinary prosthetic devices, implants and grafts, initial encounter: Secondary | ICD-10-CM

## 2017-11-27 DIAGNOSIS — N838 Other noninflammatory disorders of ovary, fallopian tube and broad ligament: Secondary | ICD-10-CM

## 2017-11-27 DIAGNOSIS — T8332XD Displacement of intrauterine contraceptive device, subsequent encounter: Secondary | ICD-10-CM

## 2017-11-27 DIAGNOSIS — R1900 Intra-abdominal and pelvic swelling, mass and lump, unspecified site: Secondary | ICD-10-CM

## 2017-11-27 DIAGNOSIS — T8389XD Other specified complication of genitourinary prosthetic devices, implants and grafts, subsequent encounter: Secondary | ICD-10-CM | POA: Diagnosis not present

## 2017-11-27 DIAGNOSIS — T8339XD Other mechanical complication of intrauterine contraceptive device, subsequent encounter: Secondary | ICD-10-CM

## 2017-11-27 DIAGNOSIS — Z30431 Encounter for routine checking of intrauterine contraceptive device: Secondary | ICD-10-CM

## 2017-11-27 DIAGNOSIS — T839XXA Unspecified complication of genitourinary prosthetic device, implant and graft, initial encounter: Secondary | ICD-10-CM

## 2017-11-27 DIAGNOSIS — N839 Noninflammatory disorder of ovary, fallopian tube and broad ligament, unspecified: Secondary | ICD-10-CM | POA: Diagnosis not present

## 2017-11-27 DIAGNOSIS — D251 Intramural leiomyoma of uterus: Secondary | ICD-10-CM

## 2017-11-27 DIAGNOSIS — T8332XA Displacement of intrauterine contraceptive device, initial encounter: Secondary | ICD-10-CM

## 2017-11-27 NOTE — Progress Notes (Signed)
    [name] 1976/06/27 [id number]        42 y.o.  [id number] presents for ultrasound for IUD location/removal and new IUD placement.  Had visit to replace her IUD but was unable to retrieve the old IUD.  Past medical history,surgical history, problem list, medications, allergies, family history and social history were all reviewed and documented in the EPIC chart.  Directed ROS with pertinent positives and negatives documented in the history of present illness/assessment and plan.  Exam: Vitals:   11/27/17 0845  BP: 124/80   General appearance:  Normal  Ultrasound transvaginal and transabdominal shows uterus normal size and echotexture.  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  Endometrial echo 1.9 mm.  IUD not visualized within the endometrial cavity/uterus.  Right ovary normal.  Left ovary with adnexal solid cystic mass 51 x 41 x 52 mm with positive Doppler flow.  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  Cul-de-sac negative  Assessment/Plan:  42 y.o. [id number] with:  1. IUD not found within the uterine cavity.  Reviewed with patient most likely expelled  Spontaneously.  Will follow up with flat plate of the abdomen to make sure that it has not migrated and is intra-abdominal.  Assuming flat plate negative then options to have the IUD replaced reviewed.  Small myoma distorting cavity.  Do not feel at least at this point need for resection. 2. Left ovarian echogenic mass.  In review of her records it was not apparent on prenatal ultrasound 2005 or abdominopelvic CT 2010.  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  I reviewed with the patient the differential of ovarian masses to include most likely dermoid but also possible ovarian carcinoma.  Options for management reviewed to include expectant management as the patient at this point is asymptomatic with serial follow-up ultrasounds for stability versus surgical removal to  include cystectomy versus oophorectomy.  What is involved with surgical removal discussed to include laparoscopic versus open.  We will start with CA 125 as baseline.  False positive and false negative issues with CA 125 discussed.  We will also arrange for MRI for better tissue definition and confirmation as far as suspicion for dermoid.  Patient knows to call me several days after the MRI so that we can discuss the results and game plan.  Greater than 50% of my 15-minute plus office visit was spent in direct face to face counseling and coordination of care with the patient.     Anastasio Auerbach MD, 9:10 AM 11/27/2017

## 2017-11-27 NOTE — Patient Instructions (Signed)
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  We will need to further evaluate this and the office will schedule a MRI.  The office also will schedule a regular x-ray of the abdomen just to make sure the IUD is gone and not displaced in the abdomen outside the uterus.

## 2017-11-28 ENCOUNTER — Encounter: Payer: Self-pay | Admitting: *Deleted

## 2017-11-28 ENCOUNTER — Telehealth: Payer: Self-pay | Admitting: *Deleted

## 2017-11-28 DIAGNOSIS — T8332XD Displacement of intrauterine contraceptive device, subsequent encounter: Secondary | ICD-10-CM

## 2017-11-28 DIAGNOSIS — N838 Other noninflammatory disorders of ovary, fallopian tube and broad ligament: Secondary | ICD-10-CM

## 2017-11-28 LAB — CA 125: CA 125: 8 U/mL (ref <35)

## 2017-11-28 NOTE — Telephone Encounter (Signed)
Orders placed at [facility], I send patient a my chart message relaying this to her and the number to call and schedule, as [facility] prefers to speak with patient to schedule.

## 2017-11-28 NOTE — Telephone Encounter (Signed)
-----   Message from Anastasio Auerbach, MD sent at 11/27/2017  9:18 AM EDT ----- Arrange for:  1.  Flat plate of abdomen reference: lost IUD.  Ultrasound shows not in uterus  2.  MRI of the pelvis reference left ovarian cystic solid mass suspicious for dermoid

## 2017-12-09 NOTE — Telephone Encounter (Signed)
[facility] has left message for pt to call. Pt has # as well to schedule.

## 2017-12-17 NOTE — Telephone Encounter (Signed)
[facility] has left message for patient to call twice

## 2017-12-23 ENCOUNTER — Encounter: Payer: Self-pay | Admitting: *Deleted

## 2017-12-24 ENCOUNTER — Telehealth: Payer: Self-pay | Admitting: *Deleted

## 2017-12-24 NOTE — Telephone Encounter (Signed)
Patient is nervous about scheduling Mri Pelvis due to ovarian mass, states she is claustrophobia. She asked since the CA-125 was normal does the Mri still need to be done? Another test that can be done instead of MRI?  If yes, she is asking for medication to help keep her calm, states she is nervous even thinking about having MRI done.  Please advise    FYI she will be having x-ray done this for lost IUD.

## 2017-12-24 NOTE — Telephone Encounter (Signed)
1.  Did she ever get the flat plate of the abdomen that I requested re: check for IUD, suspect spontaneous expulsion 2.  They do have open MRIs and we could schedule her at a facility such as that 3.  We could follow-up with ultrasound recheck of this area in 2 months and follow with that way but she would have to understand it is not as specific as the MRI

## 2017-12-25 NOTE — Telephone Encounter (Signed)
Left message for pt to call.

## 2017-12-25 NOTE — Telephone Encounter (Signed)
1. Patient said she was going to schedule this week for X-ray, she can walk in at [facility] to have this done, no appointment needed.   2. [facility] is a open MRI facility, I informed patient of this yesterday when speaking with her.  3. I relayed about having ultrasound done in 2 months and pt said she will follow up after she has x-ray done, if she will choose MRI vs. Ultrasound.

## 2018-05-09 ENCOUNTER — Telehealth: Payer: Self-pay | Admitting: Internal Medicine

## 2018-05-09 DIAGNOSIS — I10 Essential (primary) hypertension: Secondary | ICD-10-CM

## 2018-05-09 MED ORDER — TELMISARTAN 40 MG PO TABS
40.0000 mg | ORAL_TABLET | Freq: Every day | ORAL | 0 refills | Status: DC
Start: 2018-05-09 — End: 2018-06-04

## 2018-05-09 MED ORDER — CARVEDILOL 6.25 MG PO TABS
6.2500 mg | ORAL_TABLET | Freq: Two times a day (BID) | ORAL | 0 refills | Status: DC
Start: 2018-05-09 — End: 2018-06-04

## 2018-05-09 MED ORDER — CHLORTHALIDONE 25 MG PO TABS: 25.0000 mg | ORAL_TABLET | Freq: Every day | ORAL | 0 refills | Status: DC

## 2018-05-09 NOTE — Telephone Encounter (Signed)
Copied from [place] [id number]. Topic: Quick Communication - Rx Refill/Question >> May 09, 2018  3:01 PM Scherrie Gerlach wrote: Medication: carvedilol (COREG) 6.25 MG tablet telmisartan (MICARDIS) 40 MG tablet chlorthalidone (HYGROTON) 25 MG tablet  Pt is out of her meds and needs a 30 day to get her through to 10/16 appt. [facility] ([address] [place] [phone] (Phone) [phone] (Fax)  No appts until 10/15.  Pt has been scheduled

## 2018-06-04 ENCOUNTER — Other Ambulatory Visit (INDEPENDENT_AMBULATORY_CARE_PROVIDER_SITE_OTHER): Payer: 59

## 2018-06-04 ENCOUNTER — Encounter: Payer: Self-pay | Admitting: Internal Medicine

## 2018-06-04 ENCOUNTER — Ambulatory Visit: Payer: 59 | Admitting: Internal Medicine

## 2018-06-04 VITALS — BP 158/110 | HR 76 | Temp 98.3°F | Ht 65.0 in | Wt 235.0 lb

## 2018-06-04 DIAGNOSIS — T502X5A Adverse effect of carbonic-anhydrase inhibitors, benzothiadiazides and other diuretics, initial encounter: Secondary | ICD-10-CM

## 2018-06-04 DIAGNOSIS — Z0000 Encounter for general adult medical examination without abnormal findings: Secondary | ICD-10-CM

## 2018-06-04 DIAGNOSIS — I10 Essential (primary) hypertension: Secondary | ICD-10-CM

## 2018-06-04 DIAGNOSIS — E119 Type 2 diabetes mellitus without complications: Secondary | ICD-10-CM | POA: Diagnosis not present

## 2018-06-04 DIAGNOSIS — E785 Hyperlipidemia, unspecified: Secondary | ICD-10-CM

## 2018-06-04 DIAGNOSIS — E876 Hypokalemia: Secondary | ICD-10-CM | POA: Diagnosis not present

## 2018-06-04 DIAGNOSIS — E559 Vitamin D deficiency, unspecified: Secondary | ICD-10-CM | POA: Diagnosis not present

## 2018-06-04 LAB — URINALYSIS, ROUTINE W REFLEX MICROSCOPIC
BILIRUBIN URINE: NEGATIVE
Hgb urine dipstick: NEGATIVE
Ketones, ur: NEGATIVE
LEUKOCYTES UA: NEGATIVE
Nitrite: NEGATIVE
PH: 8 (ref 5.0–8.0)
RBC / HPF: NONE SEEN (ref 0–?)
Specific Gravity, Urine: 1.015 (ref 1.000–1.030)
Total Protein, Urine: NEGATIVE
Urine Glucose: NEGATIVE
Urobilinogen, UA: 1 (ref 0.0–1.0)

## 2018-06-04 LAB — CBC WITH DIFFERENTIAL/PLATELET
BASOS PCT: 0.4 % (ref 0.0–3.0)
Basophils Absolute: 0 10*3/uL (ref 0.0–0.1)
EOS ABS: 0.7 10*3/uL (ref 0.0–0.7)
Eosinophils Relative: 8 % — ABNORMAL HIGH (ref 0.0–5.0)
HEMATOCRIT: 40.5 % (ref 36.0–46.0)
Hemoglobin: 13.3 g/dL (ref 12.0–15.0)
Lymphocytes Relative: 39.1 % (ref 12.0–46.0)
Lymphs Abs: 3.3 10*3/uL (ref 0.7–4.0)
MCHC: 32.9 g/dL (ref 30.0–36.0)
MCV: 73.1 fl — ABNORMAL LOW (ref 78.0–100.0)
Monocytes Absolute: 0.7 10*3/uL (ref 0.1–1.0)
Monocytes Relative: 8 % (ref 3.0–12.0)
Neutro Abs: 3.8 10*3/uL (ref 1.4–7.7)
Neutrophils Relative %: 44.5 % (ref 43.0–77.0)
PLATELETS: 221 10*3/uL (ref 150.0–400.0)
RBC: 5.53 Mil/uL — ABNORMAL HIGH (ref 3.87–5.11)
RDW: 17.3 % — AB (ref 11.5–15.5)
WBC: 8.4 10*3/uL (ref 4.0–10.5)

## 2018-06-04 LAB — COMPREHENSIVE METABOLIC PANEL
ALT: 20 U/L (ref 0–35)
AST: 19 U/L (ref 0–37)
Albumin: 4 g/dL (ref 3.5–5.2)
Alkaline Phosphatase: 83 U/L (ref 39–117)
BILIRUBIN TOTAL: 0.4 mg/dL (ref 0.2–1.2)
BUN: 13 mg/dL (ref 6–23)
CALCIUM: 9.7 mg/dL (ref 8.4–10.5)
CO2: 35 meq/L — AB (ref 19–32)
Chloride: 97 mEq/L (ref 96–112)
Creatinine, Ser: 0.88 mg/dL (ref 0.40–1.20)
GFR: 89.64 mL/min (ref >60.00)
Glucose, Bld: 142 mg/dL — ABNORMAL HIGH (ref 70–99)
Potassium: 3.1 mEq/L — ABNORMAL LOW (ref 3.5–5.1)
Sodium: 138 mEq/L (ref 135–145)
Total Protein: 8.1 g/dL (ref 6.0–8.3)

## 2018-06-04 LAB — MICROALBUMIN / CREATININE URINE RATIO
CREATININE, U: 134.8 mg/dL
Microalb Creat Ratio: 0.9 mg/g (ref 0.0–30.0)
Microalb, Ur: 1.2 mg/dL (ref 0.0–1.9)

## 2018-06-04 LAB — LIPID PANEL
CHOL/HDL RATIO: 3
Cholesterol: 204 mg/dL — ABNORMAL HIGH (ref 0–200)
HDL: 63.3 mg/dL (ref >39.00)
LDL Cholesterol: 122 mg/dL — ABNORMAL HIGH (ref 0–99)
NonHDL: 140.9
TRIGLYCERIDES: 94 mg/dL (ref 0.0–149.0)
VLDL: 18.8 mg/dL (ref 0.0–40.0)

## 2018-06-04 LAB — POCT GLYCOSYLATED HEMOGLOBIN (HGB A1C): Hemoglobin A1C: 7.5 % — AB (ref 4.0–5.6)

## 2018-06-04 LAB — TSH: TSH: 1.89 u[IU]/mL (ref 0.35–4.50)

## 2018-06-04 LAB — VITAMIN D 25 HYDROXY (VIT D DEFICIENCY, FRACTURES): VITD: 11.23 ng/mL — AB (ref 30.00–100.00)

## 2018-06-04 MED ORDER — CHOLECALCIFEROL 1.25 MG (50000 UT) PO CAPS: 50000.0000 [IU] | ORAL_CAPSULE | ORAL | 1 refills | Status: DC

## 2018-06-04 MED ORDER — TRIAMTERENE-HCTZ 37.5-25 MG PO CAPS: 1.0000 | ORAL_CAPSULE | Freq: Every day | ORAL | 0 refills | Status: DC

## 2018-06-04 MED ORDER — CARVEDILOL 12.5 MG PO TABS: 12.5000 mg | ORAL_TABLET | Freq: Two times a day (BID) | ORAL | 1 refills | Status: DC

## 2018-06-04 MED ORDER — EMPAGLIFLOZIN-METFORMIN HCL ER 10-1000 MG PO TB24: 1.0000 | ORAL_TABLET | Freq: Every day | ORAL | 0 refills | Status: DC

## 2018-06-04 MED ORDER — TELMISARTAN 40 MG PO TABS: 40.0000 mg | ORAL_TABLET | Freq: Every day | ORAL | 1 refills | Status: DC

## 2018-06-04 NOTE — Progress Notes (Signed)
Subjective:  Patient ID: [name], female    DOB: [birth year]  Age: 42 y.o. MRN: [id number]  CC: Hypertension; Diabetes; and Annual Exam   HPI Viveca Beckstrom Fuerst presents for a CPX.  She does not monitor her blood pressure or her blood sugar.  She tells me she has been compliant recently with carvedilol, chlorthalidone, and telmisartan.  She offers no complaints today.  Outpatient Medications Prior to Visit  Medication Sig Dispense Refill  . levonorgestrel (MIRENA) 20 MCG/24HR IUD 1 Intra Uterine Device (1 each total) by Intrauterine route once. 1 each 0  . carvedilol (COREG) 6.25 MG tablet Take 1 tablet (6.25 mg total) by mouth 2 (two) times daily with a meal. 60 tablet 0  . chlorthalidone (HYGROTON) 25 MG tablet Take 1 tablet (25 mg total) by mouth daily. 30 tablet 0  . potassium chloride (K-DUR) 10 MEQ tablet Take 1 tablet (10 mEq total) 2 (two) times daily by mouth. 180 tablet 1  . telmisartan (MICARDIS) 40 MG tablet Take 1 tablet (40 mg total) by mouth daily. 30 tablet 0   No facility-administered medications prior to visit.     ROS Review of Systems  Constitutional: Negative for fatigue and unexpected weight change.  HENT: Negative.   Eyes: Negative for visual disturbance.  Respiratory: Negative for apnea, cough, shortness of breath and wheezing.        + snoring but no apnea  Cardiovascular: Negative for chest pain, palpitations and leg swelling.  Gastrointestinal: Negative for abdominal pain, constipation, diarrhea, nausea and vomiting.  Endocrine: Negative for polydipsia, polyphagia and polyuria.  Genitourinary: Negative.  Negative for decreased urine volume, difficulty urinating, dysuria, hematuria and urgency.  Musculoskeletal: Negative.  Negative for arthralgias and myalgias.  Skin: Negative.   Neurological: Negative for dizziness, weakness, light-headedness and headaches.  Hematological: Negative for adenopathy. Does not bruise/bleed easily.    Psychiatric/Behavioral: Negative.     Objective:  BP (!) 158/110 (BP Location: Left Arm, Patient Position: Sitting, Cuff Size: Large)   Pulse 76   Temp 98.3 F (36.8 C) (Oral)   Ht 5\' 5"  (1.651 m)   Wt 235 lb (106.6 kg)   SpO2 98%   BMI 39.11 kg/m   BP Readings from Last 3 Encounters:  06/04/18 (!) 158/110  11/27/17 124/80  11/04/17 (!) 160/100    Wt Readings from Last 3 Encounters:  06/04/18 235 lb (106.6 kg)  08/05/17 235 lb (106.6 kg)  06/27/17 228 lb (103.4 kg)    Physical Exam  Constitutional: She is oriented to person, place, and time. No distress.  HENT:  Mouth/Throat: Oropharynx is clear and moist. No oropharyngeal exudate.  Eyes: Conjunctivae are normal. No scleral icterus.  Neck: Normal range of motion. Neck supple. No JVD present. No thyromegaly present.  Cardiovascular: Normal rate, regular rhythm and normal heart sounds. Exam reveals no gallop.  No murmur heard. Pulmonary/Chest: Effort normal and breath sounds normal. No respiratory distress. She has no wheezes. She has no rhonchi. She has no rales.  Abdominal: Soft. Normal appearance and bowel sounds are normal. She exhibits no mass. There is no hepatosplenomegaly. There is no tenderness.  Musculoskeletal: Normal range of motion. She exhibits no edema, tenderness or deformity.  Lymphadenopathy:    She has no cervical adenopathy.  Neurological: She is alert and oriented to person, place, and time.  Skin: Skin is warm and dry. She is not diaphoretic. No pallor.  Vitals reviewed.   Lab Results  Component Value Date   WBC  8.4 06/04/2018   HGB 13.3 06/04/2018   HCT 40.5 06/04/2018   PLT 221.0 06/04/2018   GLUCOSE 142 (H) 06/04/2018   CHOL 204 (H) 06/04/2018   TRIG 94.0 06/04/2018   HDL 63.30 06/04/2018   LDLCALC 122 (H) 06/04/2018   ALT 20 06/04/2018   AST 19 06/04/2018   NA 138 06/04/2018   K 3.1 (L) 06/04/2018   CL 97 06/04/2018   CREATININE 0.88 06/04/2018   BUN 13 06/04/2018   CO2 35 (H)  06/04/2018   TSH 1.89 06/04/2018   INR 1.1 09/14/2008   HGBA1C 7.5 (A) 06/04/2018   MICROALBUR 1.2 06/04/2018    Mm Digital Screening Bilateral  Result Date: 08/07/2017 CLINICAL DATA:  Screening. Baseline. EXAM: DIGITAL SCREENING BILATERAL MAMMOGRAM WITH CAD COMPARISON:  None. ACR Breast Density Category b: There are scattered areas of fibroglandular density. FINDINGS: There are no findings suspicious for malignancy. Images were processed with CAD. IMPRESSION: No mammographic evidence of malignancy. A result letter of this screening mammogram will be mailed directly to the patient. RECOMMENDATION: Screening mammogram in one year. (Code:SM-B-01Y) BI-RADS CATEGORY  1: Negative. Electronically Signed   By: Franki Cabot M.D.   On: 08/07/2017 13:42    Assessment & Plan:   Gurtha was seen today for hypertension, diabetes and annual exam.  Diagnoses and all orders for this visit:  Essential hypertension- Her blood pressure is not adequately well controlled.  I will increase the dose of carvedilol.  I will upgrade her diuretic regimen to triamterene and hydrochlorothiazide.  Will continue the ARB at the current dose.  I will screen her for primary aldosteronism.  I will treat the vitamin D deficiency. -     CBC with Differential/Platelet; Future -     TSH; Future -     Urinalysis, Routine w reflex microscopic; Future -     Comprehensive metabolic panel; Future -     VITAMIN D 25 Hydroxy (Vit-D Deficiency, Fractures); Future -     carvedilol (COREG) 12.5 MG tablet; Take 1 tablet (12.5 mg total) by mouth 2 (two) times daily with a meal. -     Aldosterone + renin activity w/ ratio; Future -     triamterene-hydrochlorothiazide (DYAZIDE) 37.5-25 MG capsule; Take 1 each (1 capsule total) by mouth daily. -     telmisartan (MICARDIS) 40 MG tablet; Take 1 tablet (40 mg total) by mouth daily.  Diabetes mellitus without complication ([place])- Her X5M is up to 7.5%.  Her blood sugars are not adequately well  controlled.  I have asked her to start taking metformin and an SGLT2 inhibitor. -     Cancel: Hemoglobin A1c; Future -     Comprehensive metabolic panel; Future -     Microalbumin / creatinine urine ratio; Future -     POCT glycosylated hemoglobin (Hb A1C) -     Empagliflozin-metFORMIN HCl ER (SYNJARDY XR) 05-999 MG TB24; Take 1 tablet by mouth daily. -     HM Diabetes Foot Exam -     telmisartan (MICARDIS) 40 MG tablet; Take 1 tablet (40 mg total) by mouth daily.  Diuretic-induced hypokalemia- Her potassium level is down to 3.1.  I have asked her to stop taking chlorthalidone and will change to a combination of triamterene and hydrochlorothiazide to stabilize her potassium level. -     Comprehensive metabolic panel; Future -     triamterene-hydrochlorothiazide (DYAZIDE) 37.5-25 MG capsule; Take 1 each (1 capsule total) by mouth daily.  Routine general medical examination at  a health care facility- Exam completed, labs reviewed, she refused a flu vaccine, Pap and mammogram are up-to-date. -     Lipid panel; Future  Vitamin D deficiency disease -     Cholecalciferol 50000 units capsule; Take 1 capsule (50,000 Units total) by mouth once a week.  Dyslipidemia, goal LDL below 100- Her ASCVD risk score is less than 15% so at this time I do not recommend a statin for CV risk reduction.   I have discontinued Ethleen R. Fors's potassium chloride, carvedilol, and chlorthalidone. I am also having her start on carvedilol, Empagliflozin-metFORMIN HCl ER, triamterene-hydrochlorothiazide, and Cholecalciferol. Additionally, I am having her maintain her levonorgestrel and telmisartan.  Meds ordered this encounter  Medications  . carvedilol (COREG) 12.5 MG tablet    Sig: Take 1 tablet (12.5 mg total) by mouth 2 (two) times daily with a meal.    Dispense:  180 tablet    Refill:  1  . Empagliflozin-metFORMIN HCl ER (SYNJARDY XR) 05-999 MG TB24    Sig: Take 1 tablet by mouth daily.    Dispense:  28  tablet    Refill:  0  . triamterene-hydrochlorothiazide (DYAZIDE) 37.5-25 MG capsule    Sig: Take 1 each (1 capsule total) by mouth daily.    Dispense:  90 capsule    Refill:  0  . Cholecalciferol 50000 units capsule    Sig: Take 1 capsule (50,000 Units total) by mouth once a week.    Dispense:  12 capsule    Refill:  1  . telmisartan (MICARDIS) 40 MG tablet    Sig: Take 1 tablet (40 mg total) by mouth daily.    Dispense:  90 tablet    Refill:  1     Follow-up: Return in about 4 weeks (around 07/02/2018).  Scarlette Calico, MD

## 2018-06-04 NOTE — Patient Instructions (Signed)
Preventive Care 18-39 Years, Female Preventive care refers to lifestyle choices and visits with your health care provider that can promote health and wellness. What does preventive care include?  A yearly physical exam. This is also called an annual well check.  Dental exams once or twice a year.  Routine eye exams. Ask your health care provider how often you should have your eyes checked.  Personal lifestyle choices, including: ? Daily care of your teeth and gums. ? Regular physical activity. ? Eating a healthy diet. ? Avoiding tobacco and drug use. ? Limiting alcohol use. ? Practicing safe sex. ? Taking vitamin and mineral supplements as recommended by your health care provider. What happens during an annual well check? The services and screenings done by your health care provider during your annual well check will depend on your age, overall health, lifestyle risk factors, and family history of disease. Counseling Your health care provider may ask you questions about your:  Alcohol use.  Tobacco use.  Drug use.  Emotional well-being.  Home and relationship well-being.  Sexual activity.  Eating habits.  Work and work Statistician.  Method of birth control.  Menstrual cycle.  Pregnancy history.  Screening You may have the following tests or measurements:  Height, weight, and BMI.  Diabetes screening. This is done by checking your blood sugar (glucose) after you have not eaten for a while (fasting).  Blood pressure.  Lipid and cholesterol levels. These may be checked every 5 years starting at age 66.  Skin check.  Hepatitis C blood test.  Hepatitis B blood test.  Sexually transmitted disease (STD) testing.  BRCA-related cancer screening. This may be done if you have a family history of breast, ovarian, tubal, or peritoneal cancers.  Pelvic exam and Pap test. This may be done every 3 years starting at age 40. Starting at age 59, this may be done every 5  years if you have a Pap test in combination with an HPV test.  Discuss your test results, treatment options, and if necessary, the need for more tests with your health care provider. Vaccines Your health care provider may recommend certain vaccines, such as:  Influenza vaccine. This is recommended every year.  Tetanus, diphtheria, and acellular pertussis (Tdap, Td) vaccine. You may need a Td booster every 10 years.  Varicella vaccine. You may need this if you have not been vaccinated.  HPV vaccine. If you are 69 or younger, you may need three doses over 6 months.  Measles, mumps, and rubella (MMR) vaccine. You may need at least one dose of MMR. You may also need a second dose.  Pneumococcal 13-valent conjugate (PCV13) vaccine. You may need this if you have certain conditions and were not previously vaccinated.  Pneumococcal polysaccharide (PPSV23) vaccine. You may need one or two doses if you smoke cigarettes or if you have certain conditions.  Meningococcal vaccine. One dose is recommended if you are age 27-21 years and a first-year college student living in a residence hall, or if you have one of several medical conditions. You may also need additional booster doses.  Hepatitis A vaccine. You may need this if you have certain conditions or if you travel or work in places where you may be exposed to hepatitis A.  Hepatitis B vaccine. You may need this if you have certain conditions or if you travel or work in places where you may be exposed to hepatitis B.  Haemophilus influenzae type b (Hib) vaccine. You may need this if  you have certain risk factors.  Talk to your health care provider about which screenings and vaccines you need and how often you need them. This information is not intended to replace advice given to you by your health care provider. Make sure you discuss any questions you have with your health care provider. Document Released: 10/02/2001 Document Revised: 04/25/2016  Document Reviewed: 06/07/2015 Elsevier Interactive Patient Education  [employer].

## 2018-06-11 LAB — ALDOSTERONE + RENIN ACTIVITY W/ RATIO: Aldosterone: 5 ng/dL

## 2018-06-13 NOTE — Telephone Encounter (Signed)
Pt called back regarding a message she received about coming in for lab redraw. Spoke to the lab and lab did leave a message to come back for redraw. Patient informed aldosterone and renin activity with ratio did not results.

## 2018-06-18 ENCOUNTER — Other Ambulatory Visit: Payer: 59

## 2018-06-18 DIAGNOSIS — I10 Essential (primary) hypertension: Secondary | ICD-10-CM | POA: Diagnosis not present

## 2018-06-27 LAB — ALDOSTERONE + RENIN ACTIVITY W/ RATIO
ALDO / PRA Ratio: 12.9 Ratio (ref 0.9–28.9)
Aldosterone: 13 ng/dL
Renin Activity: 1.01 ng/mL/h (ref 0.25–5.82)

## 2018-07-07 ENCOUNTER — Ambulatory Visit: Payer: 59 | Admitting: Internal Medicine

## 2018-07-07 ENCOUNTER — Encounter: Payer: Self-pay | Admitting: Internal Medicine

## 2018-07-07 ENCOUNTER — Other Ambulatory Visit (INDEPENDENT_AMBULATORY_CARE_PROVIDER_SITE_OTHER): Payer: 59

## 2018-07-07 VITALS — BP 160/110 | HR 85 | Temp 98.2°F | Ht 65.0 in | Wt 239.5 lb

## 2018-07-07 DIAGNOSIS — E119 Type 2 diabetes mellitus without complications: Secondary | ICD-10-CM | POA: Diagnosis not present

## 2018-07-07 DIAGNOSIS — I10 Essential (primary) hypertension: Secondary | ICD-10-CM

## 2018-07-07 DIAGNOSIS — E785 Hyperlipidemia, unspecified: Secondary | ICD-10-CM

## 2018-07-07 LAB — BASIC METABOLIC PANEL
BUN: 14 mg/dL (ref 6–23)
CALCIUM: 9.2 mg/dL (ref 8.4–10.5)
CO2: 29 mEq/L (ref 19–32)
CREATININE: 0.94 mg/dL (ref 0.40–1.20)
Chloride: 104 mEq/L (ref 96–112)
GFR: 83.04 mL/min (ref >60.00)
Glucose, Bld: 152 mg/dL — ABNORMAL HIGH (ref 70–99)
Potassium: 4 mEq/L (ref 3.5–5.1)
Sodium: 140 mEq/L (ref 135–145)

## 2018-07-07 MED ORDER — ATORVASTATIN CALCIUM 20 MG PO TABS: 20.0000 mg | ORAL_TABLET | Freq: Every day | ORAL | 1 refills | Status: DC

## 2018-07-07 MED ORDER — AZILSARTAN MEDOXOMIL 80 MG PO TABS: 1.0000 | ORAL_TABLET | Freq: Every day | ORAL | 1 refills | Status: DC

## 2018-07-07 MED ORDER — EMPAGLIFLOZIN-METFORMIN HCL ER 10-1000 MG PO TB24: 1.0000 | ORAL_TABLET | Freq: Every day | ORAL | 1 refills | Status: DC

## 2018-07-07 NOTE — Addendum Note (Signed)
Addended by: Karle Barr on: 07/07/2018 02:39 PM   Modules accepted: Orders

## 2018-07-07 NOTE — Patient Instructions (Signed)

## 2018-07-07 NOTE — Progress Notes (Signed)
Subjective:  Patient ID: [name], female    DOB: 07-May-1976  Age: 42 y.o. MRN: [id number]  CC: Hypertension   HPI ANGELLEE COHILL presents for a BP check - She is not taking telmisartan.  She tells me it is on back order and the pharmacy has not delivered to her.  She has not taken her other 2 antihypertensives for about 2 days because she says she ran out despite the fact that she was given prescriptions for 90-day supply on both of these about a month ago.  She has been working on her lifestyle modifications with exercise but unfortunately she has gained 4 pounds since I last saw her.  She is tolerating her current antihypertensives and oral antidiabetic meds well with no side effects or complications.  Outpatient Medications Prior to Visit  Medication Sig Dispense Refill  . carvedilol (COREG) 12.5 MG tablet Take 1 tablet (12.5 mg total) by mouth 2 (two) times daily with a meal. 180 tablet 1  . Cholecalciferol 50000 units capsule Take 1 capsule (50,000 Units total) by mouth once a week. 12 capsule 1  . levonorgestrel (MIRENA) 20 MCG/24HR IUD 1 Intra Uterine Device (1 each total) by Intrauterine route once. 1 each 0  . triamterene-hydrochlorothiazide (DYAZIDE) 37.5-25 MG capsule Take 1 each (1 capsule total) by mouth daily. 90 capsule 0  . Empagliflozin-metFORMIN HCl ER (SYNJARDY XR) 05-999 MG TB24 Take 1 tablet by mouth daily. 28 tablet 0  . telmisartan (MICARDIS) 40 MG tablet Take 1 tablet (40 mg total) by mouth daily. (Patient not taking: Reported on 07/07/2018) 90 tablet 1   No facility-administered medications prior to visit.     ROS Review of Systems  Constitutional: Positive for unexpected weight change (wt gain). Negative for diaphoresis and fatigue.  HENT: Negative.   Eyes: Negative for visual disturbance.  Respiratory: Negative for apnea, cough, chest tightness, shortness of breath and wheezing.   Cardiovascular: Negative for chest pain, palpitations and leg swelling.   Gastrointestinal: Negative for abdominal pain, constipation, diarrhea, nausea and vomiting.  Endocrine: Negative for polydipsia, polyphagia and polyuria.  Genitourinary: Negative.  Negative for difficulty urinating and dysuria.  Musculoskeletal: Negative.  Negative for arthralgias and myalgias.  Skin: Negative.  Negative for color change and rash.  Neurological: Negative.  Negative for dizziness, weakness, light-headedness and headaches.  Hematological: Negative for adenopathy. Does not bruise/bleed easily.  Psychiatric/Behavioral: Negative.     Objective:  BP (!) 160/110 (BP Location: Left Arm, Patient Position: Sitting, Cuff Size: Large)   Pulse 85   Temp 98.2 F (36.8 C) (Oral)   Ht 5\' 5"  (1.651 m)   Wt 239 lb 8 oz (108.6 kg)   SpO2 97%   BMI 39.85 kg/m   BP Readings from Last 3 Encounters:  07/07/18 (!) 160/110  06/04/18 (!) 158/110  11/27/17 124/80    Wt Readings from Last 3 Encounters:  07/07/18 239 lb 8 oz (108.6 kg)  06/04/18 235 lb (106.6 kg)  08/05/17 235 lb (106.6 kg)    Physical Exam  Constitutional: She is oriented to person, place, and time. No distress.  HENT:  Mouth/Throat: Oropharynx is clear and moist. No oropharyngeal exudate.  Eyes: Conjunctivae are normal. No scleral icterus.  Neck: Normal range of motion. Neck supple. No JVD present. No thyromegaly present.  Cardiovascular: Normal rate, regular rhythm and normal heart sounds. Exam reveals no gallop.  No murmur heard. Pulmonary/Chest: Effort normal and breath sounds normal. No respiratory distress. She has no wheezes. She  has no rales.  Abdominal: Soft. Bowel sounds are normal. She exhibits no mass. There is no tenderness.  Musculoskeletal: Normal range of motion. She exhibits no edema, tenderness or deformity.  Lymphadenopathy:    She has no cervical adenopathy.  Neurological: She is alert and oriented to person, place, and time.  Skin: Skin is warm and dry. She is not diaphoretic. No pallor.    Vitals reviewed.   Lab Results  Component Value Date   WBC 8.4 06/04/2018   HGB 13.3 06/04/2018   HCT 40.5 06/04/2018   PLT 221.0 06/04/2018   GLUCOSE 152 (H) 07/07/2018   CHOL 204 (H) 06/04/2018   TRIG 94.0 06/04/2018   HDL 63.30 06/04/2018   LDLCALC 122 (H) 06/04/2018   ALT 20 06/04/2018   AST 19 06/04/2018   NA 140 07/07/2018   K 4.0 07/07/2018   CL 104 07/07/2018   CREATININE 0.94 07/07/2018   BUN 14 07/07/2018   CO2 29 07/07/2018   TSH 1.89 06/04/2018   INR 1.1 09/14/2008   HGBA1C 7.5 (A) 06/04/2018   MICROALBUR 1.2 06/04/2018    Mm Digital Screening Bilateral  Result Date: 08/07/2017 CLINICAL DATA:  Screening. Baseline. EXAM: DIGITAL SCREENING BILATERAL MAMMOGRAM WITH CAD COMPARISON:  None. ACR Breast Density Category b: There are scattered areas of fibroglandular density. FINDINGS: There are no findings suspicious for malignancy. Images were processed with CAD. IMPRESSION: No mammographic evidence of malignancy. A result letter of this screening mammogram will be mailed directly to the patient. RECOMMENDATION: Screening mammogram in one year. (Code:SM-B-01Y) BI-RADS CATEGORY  1: Negative. Electronically Signed   By: Franki Cabot M.D.   On: 08/07/2017 13:42    Assessment & Plan:   Nyomi was seen today for hypertension.  Diagnoses and all orders for this visit:  Malignant hypertension- Her blood pressure is not well controlled and I think this is mostly caused by noncompliance.  I encouraged her to continue working on her lifestyle modifications.  I have asked her to undergo a renal ultrasound to be certain there aren't any structural lesions causing her hypertension.  Will switch to a different ARB.  I have also asked her to be compliant with the other 2 antihypertensives. -     US Renal; Future -     Azilsartan Medoxomil (EDARBI) 80 MG TABS; Take 1 tablet (80 mg total) by mouth daily. -     Basic metabolic panel; Future  Diabetes mellitus without complication  ([place])- She is tolerating the combination of metformin and an SGLT2 inhibitor well.  Will continue this combination. -     Empagliflozin-metFORMIN HCl ER (SYNJARDY XR) 05-999 MG TB24; Take 1 tablet by mouth daily. -     Azilsartan Medoxomil (EDARBI) 80 MG TABS; Take 1 tablet (80 mg total) by mouth daily. -     Basic metabolic panel; Future  Dyslipidemia, goal LDL below 100- She has not achieved her LDL goal.  I have asked her to start taking a statin for CV risk reduction. -     atorvastatin (LIPITOR) 20 MG tablet; Take 1 tablet (20 mg total) by mouth daily.   I have discontinued Shereena R. Kowal's telmisartan. I am also having her start on Azilsartan Medoxomil and atorvastatin. Additionally, I am having her maintain her levonorgestrel, carvedilol, triamterene-hydrochlorothiazide, Cholecalciferol, and Empagliflozin-metFORMIN HCl ER.  Meds ordered this encounter  Medications  . Empagliflozin-metFORMIN HCl ER (SYNJARDY XR) 05-999 MG TB24    Sig: Take 1 tablet by mouth daily.    Dispense:  90 tablet    Refill:  1  . Azilsartan Medoxomil (EDARBI) 80 MG TABS    Sig: Take 1 tablet (80 mg total) by mouth daily.    Dispense:  90 tablet    Refill:  1  . atorvastatin (LIPITOR) 20 MG tablet    Sig: Take 1 tablet (20 mg total) by mouth daily.    Dispense:  90 tablet    Refill:  1     Follow-up: Return in about 4 weeks (around 08/04/2018).  Scarlette Calico, MD

## 2018-07-22 ENCOUNTER — Telehealth: Payer: Self-pay

## 2018-07-22 NOTE — Telephone Encounter (Signed)
PA has been approved. Sent mychart message to patient informing of same.

## 2018-07-22 NOTE — Telephone Encounter (Signed)
Key: [id number]

## 2018-07-28 ENCOUNTER — Ambulatory Visit
Admission: RE | Admit: 2018-07-28 | Discharge: 2018-07-28 | Disposition: A | Discharge status: Home / Self Care | Payer: 59 | Source: Ambulatory Visit | Attending: Internal Medicine | Admitting: Internal Medicine

## 2018-07-28 ENCOUNTER — Telehealth: Payer: Self-pay

## 2018-07-28 DIAGNOSIS — I10 Essential (primary) hypertension: Secondary | ICD-10-CM

## 2018-07-28 DIAGNOSIS — I159 Secondary hypertension, unspecified: Secondary | ICD-10-CM | POA: Diagnosis not present

## 2018-07-28 DIAGNOSIS — N838 Other noninflammatory disorders of ovary, fallopian tube and broad ligament: Secondary | ICD-10-CM

## 2018-07-28 NOTE — Telephone Encounter (Signed)
Spoke with patient and advised. U/s Order placed and she spoke with [facility] and scheduled.  [name] will arrange the x-ray and she was informed she will hear from someone soon about that appointment.

## 2018-07-28 NOTE — Telephone Encounter (Signed)
Since it has been 8 months I would recommend:  1.  Flatplate of abdomen reference history of IUD not within uterine cavity 2.  Schedule ultrasound here to relook at the ovarian mass to make sure it is still there and to see if there are any changes and then decide from there whether we want to do the MRI.  I would hate to have her arrange the MRI which is very expensive and the area has resolved.

## 2018-07-28 NOTE — Telephone Encounter (Signed)
Patient called because when she saw you back in April you recommended an x-ray to locate IUD and a MRI to check ovarian mass. She did not schedule either of these at that time but she states she is now ready to schedule.  Ok to proceed with scheduling.  Just asking because so much time since ordered.

## 2018-07-29 ENCOUNTER — Telehealth: Payer: Self-pay | Admitting: *Deleted

## 2018-07-29 ENCOUNTER — Other Ambulatory Visit: Payer: Self-pay | Admitting: Internal Medicine

## 2018-07-29 ENCOUNTER — Encounter: Payer: Self-pay | Admitting: Internal Medicine

## 2018-07-29 ENCOUNTER — Encounter: Payer: Self-pay | Admitting: *Deleted

## 2018-07-29 DIAGNOSIS — T839XXA Unspecified complication of genitourinary prosthetic device, implant and graft, initial encounter: Secondary | ICD-10-CM

## 2018-07-29 DIAGNOSIS — I10 Essential (primary) hypertension: Secondary | ICD-10-CM

## 2018-07-29 NOTE — Telephone Encounter (Signed)
-----   Message from [name], [place] sent at 07/28/2018  3:45 PM EST ----- Regarding: x ray Per Dr. Loetta Rough " Flatplate of abdomen reference history of IUD not within uterine cavity"  I spoke with patient and told her I thought you placed order and hospital will call her to schedule.

## 2018-07-29 NOTE — Telephone Encounter (Signed)
Sent my chart message letting her know order placed and she can walk in at [place] anytime from 8am-4:30pm to have x-ray done.

## 2018-07-30 ENCOUNTER — Encounter: Payer: Self-pay | Admitting: Gynecology

## 2018-07-30 ENCOUNTER — Ambulatory Visit (INDEPENDENT_AMBULATORY_CARE_PROVIDER_SITE_OTHER): Payer: 59 | Admitting: Gynecology

## 2018-07-30 ENCOUNTER — Ambulatory Visit (INDEPENDENT_AMBULATORY_CARE_PROVIDER_SITE_OTHER): Payer: 59

## 2018-07-30 VITALS — BP 130/84

## 2018-07-30 DIAGNOSIS — N912 Amenorrhea, unspecified: Secondary | ICD-10-CM | POA: Diagnosis not present

## 2018-07-30 DIAGNOSIS — N83202 Unspecified ovarian cyst, left side: Secondary | ICD-10-CM | POA: Diagnosis not present

## 2018-07-30 DIAGNOSIS — N838 Other noninflammatory disorders of ovary, fallopian tube and broad ligament: Secondary | ICD-10-CM

## 2018-07-30 NOTE — Patient Instructions (Signed)
Follow-up for the x-ray of the abdomen and the Mirena IUD placement.  Follow-up in 6 months for ultrasound to recheck the left ovarian mass.

## 2018-07-30 NOTE — Progress Notes (Signed)
    [name] 29-Mar-1976 [id number]        42 y.o.  [id number] presents for follow-up ultrasound.  History of ultrasound earlier this year in April for IUD location where a 48 mm mean echogenic left ovarian cyst was noted.  CA 125 was 8.  She was to have a flatplate of the abdomen done for IUD location as there was no evidence of the IUD on ultrasound.  Has not followed up for the flat plate yet but returns now for follow-up of the left ovarian cystic mass.  Past medical history,surgical history, problem list, medications, allergies, family history and social history were all reviewed and documented in the EPIC chart.  Directed ROS with pertinent positives and negatives documented in the history of present illness/assessment and plan.  Exam: Vitals:   07/30/18 1217  BP: 130/84   General appearance:  Normal  Ultrasound transvaginal and transabdominal shows uterus normal size and echotexture.  Endometrial echo 2.4 mm.  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  IUD not visualized within the endometrial cavity.  Right ovary is normal.  Left ovary with cystic solid mass highly reflective measuring 53 mm mean.  Color flow Doppler to this area negative.  Cul-de-sac negative.  Assessment/Plan:  42 y.o. 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  Was 48 mm mean in April and now 53 mm mean.  Discussed with her whether this is really an enlargement or variation of measurements.  Certainly not significantly enlarged from prior measurements.  CA 125 was normal.  Differential to include most likely dermoid versus other benign pathology versus malignancy discussed.  Options for management reviewed to include removal now with laparoscopic cystectomy versus oophorectomy.  Options for tubal sterilization at the same time if she is ready to make that decision also discussed.  Alternatives for expectant management with re-ultrasound in 6 months excepting the possibility of  malignancy also discussed.  Patient is not interested in surgery at this time.  She would like to have another IUD placed.  She will follow-up for the flat plate as ordered just to make sure is not within the peritoneal cavity and then replace her Mirena IUD.  She does note that she has not had any bleeding for over a year and the question is whether this is IUD related or not.  We will go ahead and check baseline labs to include hCG FSH TSH and prolactin.  She will follow-up for the IUD placement and ultimately follow-up for the ultrasound in 6 months.    Anastasio Auerbach MD, 12:35 PM 07/30/2018

## 2018-07-31 LAB — PROLACTIN: Prolactin: 12.2 ng/mL

## 2018-07-31 LAB — HCG, SERUM, QUALITATIVE: Preg, Serum: NEGATIVE

## 2018-07-31 LAB — FOLLICLE STIMULATING HORMONE: FSH: 81.7 m[IU]/mL

## 2018-07-31 LAB — TSH: TSH: 1.34 mIU/L

## 2018-09-14 ENCOUNTER — Other Ambulatory Visit: Payer: Self-pay | Admitting: Internal Medicine

## 2018-09-14 DIAGNOSIS — E876 Hypokalemia: Secondary | ICD-10-CM

## 2018-09-14 DIAGNOSIS — T502X5A Adverse effect of carbonic-anhydrase inhibitors, benzothiadiazides and other diuretics, initial encounter: Secondary | ICD-10-CM

## 2018-09-14 DIAGNOSIS — I10 Essential (primary) hypertension: Secondary | ICD-10-CM

## 2018-12-05 ENCOUNTER — Other Ambulatory Visit: Payer: Self-pay | Admitting: Internal Medicine

## 2018-12-05 DIAGNOSIS — E559 Vitamin D deficiency, unspecified: Secondary | ICD-10-CM

## 2018-12-15 ENCOUNTER — Other Ambulatory Visit: Payer: Self-pay | Admitting: Internal Medicine

## 2018-12-15 DIAGNOSIS — I10 Essential (primary) hypertension: Secondary | ICD-10-CM

## 2018-12-15 DIAGNOSIS — T502X5A Adverse effect of carbonic-anhydrase inhibitors, benzothiadiazides and other diuretics, initial encounter: Secondary | ICD-10-CM

## 2018-12-15 DIAGNOSIS — E876 Hypokalemia: Secondary | ICD-10-CM

## 2018-12-22 DIAGNOSIS — I10 Essential (primary) hypertension: Secondary | ICD-10-CM | POA: Diagnosis not present

## 2018-12-22 DIAGNOSIS — N390 Urinary tract infection, site not specified: Secondary | ICD-10-CM | POA: Diagnosis not present

## 2018-12-22 DIAGNOSIS — E876 Hypokalemia: Secondary | ICD-10-CM | POA: Diagnosis not present

## 2018-12-22 DIAGNOSIS — E119 Type 2 diabetes mellitus without complications: Secondary | ICD-10-CM | POA: Diagnosis not present

## 2018-12-22 DIAGNOSIS — E785 Hyperlipidemia, unspecified: Secondary | ICD-10-CM | POA: Diagnosis not present

## 2019-01-09 ENCOUNTER — Other Ambulatory Visit: Payer: Self-pay | Admitting: Internal Medicine

## 2019-01-09 DIAGNOSIS — E785 Hyperlipidemia, unspecified: Secondary | ICD-10-CM

## 2019-03-17 ENCOUNTER — Other Ambulatory Visit: Payer: Self-pay | Admitting: Internal Medicine

## 2019-03-17 DIAGNOSIS — I10 Essential (primary) hypertension: Secondary | ICD-10-CM

## 2019-03-17 DIAGNOSIS — E876 Hypokalemia: Secondary | ICD-10-CM

## 2019-03-17 NOTE — Telephone Encounter (Signed)
Appointment scheduled.

## 2019-03-17 NOTE — Telephone Encounter (Signed)
Can you ask pt to schedule an in office visit?

## 2019-03-23 ENCOUNTER — Other Ambulatory Visit: Payer: Self-pay

## 2019-03-23 ENCOUNTER — Ambulatory Visit (INDEPENDENT_AMBULATORY_CARE_PROVIDER_SITE_OTHER): Payer: 59 | Admitting: Internal Medicine

## 2019-03-23 ENCOUNTER — Other Ambulatory Visit (INDEPENDENT_AMBULATORY_CARE_PROVIDER_SITE_OTHER): Payer: 59

## 2019-03-23 ENCOUNTER — Encounter: Payer: Self-pay | Admitting: Internal Medicine

## 2019-03-23 VITALS — BP 158/98 | HR 76 | Temp 98.4°F | Resp 16 | Ht 65.0 in | Wt 243.0 lb

## 2019-03-23 DIAGNOSIS — M5416 Radiculopathy, lumbar region: Secondary | ICD-10-CM | POA: Insufficient documentation

## 2019-03-23 DIAGNOSIS — T502X5A Adverse effect of carbonic-anhydrase inhibitors, benzothiadiazides and other diuretics, initial encounter: Secondary | ICD-10-CM

## 2019-03-23 DIAGNOSIS — E119 Type 2 diabetes mellitus without complications: Secondary | ICD-10-CM

## 2019-03-23 DIAGNOSIS — E876 Hypokalemia: Secondary | ICD-10-CM

## 2019-03-23 DIAGNOSIS — I10 Essential (primary) hypertension: Secondary | ICD-10-CM

## 2019-03-23 DIAGNOSIS — E785 Hyperlipidemia, unspecified: Secondary | ICD-10-CM

## 2019-03-23 DIAGNOSIS — E559 Vitamin D deficiency, unspecified: Secondary | ICD-10-CM

## 2019-03-23 LAB — LIPID PANEL
Cholesterol: 160 mg/dL (ref 0–200)
HDL: 57.1 mg/dL (ref >39.00)
LDL Cholesterol: 85 mg/dL (ref 0–99)
NonHDL: 102.61
Total CHOL/HDL Ratio: 3
Triglycerides: 89 mg/dL (ref 0.0–149.0)
VLDL: 17.8 mg/dL (ref 0.0–40.0)

## 2019-03-23 LAB — HEPATIC FUNCTION PANEL
ALT: 14 U/L (ref 0–35)
AST: 13 U/L (ref 0–37)
Albumin: 3.8 g/dL (ref 3.5–5.2)
Alkaline Phosphatase: 83 U/L (ref 39–117)
Bilirubin, Direct: 0.1 mg/dL (ref 0.0–0.3)
Total Bilirubin: 0.5 mg/dL (ref 0.2–1.2)
Total Protein: 7.1 g/dL (ref 6.0–8.3)

## 2019-03-23 LAB — TSH: TSH: 1.56 u[IU]/mL (ref 0.35–4.50)

## 2019-03-23 LAB — CBC WITH DIFFERENTIAL/PLATELET
Basophils Absolute: 0 10*3/uL (ref 0.0–0.1)
Basophils Relative: 0.5 % (ref 0.0–3.0)
Eosinophils Absolute: 0.8 10*3/uL — ABNORMAL HIGH (ref 0.0–0.7)
Eosinophils Relative: 10.4 % — ABNORMAL HIGH (ref 0.0–5.0)
HCT: 36.7 % (ref 36.0–46.0)
Hemoglobin: 12.1 g/dL (ref 12.0–15.0)
Lymphocytes Relative: 38.3 % (ref 12.0–46.0)
Lymphs Abs: 3.1 10*3/uL (ref 0.7–4.0)
MCHC: 33 g/dL (ref 30.0–36.0)
MCV: 77 fl — ABNORMAL LOW (ref 78.0–100.0)
Monocytes Absolute: 0.6 10*3/uL (ref 0.1–1.0)
Monocytes Relative: 7.5 % (ref 3.0–12.0)
Neutro Abs: 3.5 10*3/uL (ref 1.4–7.7)
Neutrophils Relative %: 43.3 % (ref 43.0–77.0)
Platelets: 185 10*3/uL (ref 150.0–400.0)
RBC: 4.77 Mil/uL (ref 3.87–5.11)
RDW: 16.8 % — ABNORMAL HIGH (ref 11.5–15.5)
WBC: 8 10*3/uL (ref 4.0–10.5)

## 2019-03-23 LAB — BASIC METABOLIC PANEL
BUN: 10 mg/dL (ref 6–23)
CO2: 25 mEq/L (ref 19–32)
Calcium: 8.9 mg/dL (ref 8.4–10.5)
Chloride: 107 mEq/L (ref 96–112)
Creatinine, Ser: 0.94 mg/dL (ref 0.40–1.20)
GFR: 77.87 mL/min (ref >60.00)
Glucose, Bld: 178 mg/dL — ABNORMAL HIGH (ref 70–99)
Potassium: 3.6 mEq/L (ref 3.5–5.1)
Sodium: 140 mEq/L (ref 135–145)

## 2019-03-23 LAB — URINALYSIS, ROUTINE W REFLEX MICROSCOPIC
Bilirubin Urine: NEGATIVE
Hgb urine dipstick: NEGATIVE
Ketones, ur: NEGATIVE
Leukocytes,Ua: NEGATIVE
Nitrite: NEGATIVE
RBC / HPF: NONE SEEN (ref 0–?)
Specific Gravity, Urine: >=1.03 — AB (ref 1.000–1.030)
Total Protein, Urine: NEGATIVE
Urine Glucose: NEGATIVE
Urobilinogen, UA: 0.2 (ref 0.0–1.0)
pH: 5.5 (ref 5.0–8.0)

## 2019-03-23 LAB — VITAMIN D 25 HYDROXY (VIT D DEFICIENCY, FRACTURES): VITD: 34.51 ng/mL (ref 30.00–100.00)

## 2019-03-23 LAB — HEMOGLOBIN A1C: Hgb A1c MFr Bld: 8.1 % — ABNORMAL HIGH (ref 4.6–6.5)

## 2019-03-23 MED ORDER — EDARBI 80 MG PO TABS: 1.0000 | ORAL_TABLET | Freq: Every day | ORAL | 0 refills | Status: DC

## 2019-03-23 MED ORDER — SYNJARDY XR 10-1000 MG PO TB24: 1.0000 | ORAL_TABLET | Freq: Every day | ORAL | 0 refills | Status: DC

## 2019-03-23 MED ORDER — IRBESARTAN 150 MG PO TABS: 150.0000 mg | ORAL_TABLET | Freq: Every day | ORAL | 1 refills | Status: DC

## 2019-03-23 MED ORDER — CARVEDILOL 12.5 MG PO TABS: 12.5000 mg | ORAL_TABLET | Freq: Two times a day (BID) | ORAL | 1 refills | Status: DC

## 2019-03-23 MED ORDER — TRIAMTERENE-HCTZ 37.5-25 MG PO CAPS: 1.0000 | ORAL_CAPSULE | Freq: Every day | ORAL | 0 refills | Status: DC

## 2019-03-23 NOTE — Progress Notes (Signed)
Subjective:  Patient ID: [name], female    DOB: 01-10-1976  Age: 43 y.o. MRN: [id number]  CC: Hypertension, Hyperlipidemia, Diabetes, and Back Pain   HPI Christopher R Donnelly presents for f/up - She has not been monitoring her blood pressure.  She ran out of the diuretic about 3 days ago and now complains of dizziness.  She does not think she is taking the ARB.  She tells me she is taking carvedilol.  She thinks she is taking the statin.  She is not taking Synjardy.  She does not monitor her blood sugar.  She denies polys.  She complains of chronic low back pain that radiates into her left lower extremity.  Outpatient Medications Prior to Visit  Medication Sig Dispense Refill  . atorvastatin (LIPITOR) 20 MG tablet Take 1 tablet by mouth once daily 90 tablet 1  . levonorgestrel (MIRENA) 20 MCG/24HR IUD 1 Intra Uterine Device (1 each total) by Intrauterine route once. 1 each 0  . Azilsartan Medoxomil (EDARBI) 80 MG TABS Take 1 tablet (80 mg total) by mouth daily. 90 tablet 1  . carvedilol (COREG) 12.5 MG tablet Take 1 tablet (12.5 mg total) by mouth 2 (two) times daily with a meal. 180 tablet 1  . OPTIMAL-D 1.25 MG (50000 UT) capsule Take 1 capsule by mouth once a week 4 capsule 0  . Empagliflozin-metFORMIN HCl ER (SYNJARDY XR) 05-999 MG TB24 Take 1 tablet by mouth daily. (Patient not taking: Reported on 03/23/2019) 90 tablet 1  . triamterene-hydrochlorothiazide (DYAZIDE) 37.5-25 MG capsule Take 1 capsule by mouth once daily (Patient not taking: Reported on 03/23/2019) 90 capsule 0   No facility-administered medications prior to visit.     ROS Review of Systems  Constitutional: Negative.  Negative for diaphoresis, fatigue and unexpected weight change.  HENT: Negative.   Eyes: Negative.  Negative for visual disturbance.  Respiratory: Negative for cough, chest tightness and shortness of breath.   Cardiovascular: Negative for chest pain, palpitations and leg swelling.  Gastrointestinal:  Negative for abdominal pain, constipation, diarrhea, nausea and vomiting.  Endocrine: Negative for polydipsia, polyphagia and polyuria.  Genitourinary: Negative.  Negative for difficulty urinating.  Musculoskeletal: Positive for back pain. Negative for arthralgias and myalgias.  Skin: Negative.  Negative for color change and pallor.  Neurological: Positive for dizziness. Negative for weakness, light-headedness, numbness and headaches.  Hematological: Negative for adenopathy. Does not bruise/bleed easily.  Psychiatric/Behavioral: Negative.     Objective:  BP (!) 158/98 (BP Location: Left Arm, Patient Position: Sitting, Cuff Size: Large)   Pulse 76   Temp 98.4 F (36.9 C) (Oral)   Resp 16   Ht 5\' 5"  (1.651 m)   Wt 243 lb (110.2 kg)   SpO2 97%   BMI 40.44 kg/m   BP Readings from Last 3 Encounters:  03/23/19 (!) 158/98  07/30/18 130/84  07/07/18 (!) 160/110    Wt Readings from Last 3 Encounters:  03/23/19 243 lb (110.2 kg)  07/07/18 239 lb 8 oz (108.6 kg)  06/04/18 235 lb (106.6 kg)    Physical Exam Vitals signs reviewed.  Constitutional:      General: She is not in acute distress.    Appearance: She is obese. She is not ill-appearing, toxic-appearing or diaphoretic.  HENT:     Nose: Nose normal.     Mouth/Throat:     Pharynx: Oropharynx is clear.  Eyes:     General: No scleral icterus.       Right eye: No  discharge.        Left eye: No discharge.     Extraocular Movements: Extraocular movements intact.     Pupils: Pupils are equal, round, and reactive to light.  Neck:     Musculoskeletal: Normal range of motion. No neck rigidity.  Cardiovascular:     Rate and Rhythm: Normal rate and regular rhythm.     Heart sounds: No murmur.  Pulmonary:     Effort: Pulmonary effort is normal. No respiratory distress.     Breath sounds: No stridor. No wheezing, rhonchi or rales.  Abdominal:     General: Abdomen is protuberant. Bowel sounds are normal. There is no distension.      Palpations: There is no hepatomegaly or splenomegaly.     Tenderness: There is no abdominal tenderness.  Musculoskeletal: Normal range of motion.        General: No swelling.     Right lower leg: No edema.     Left lower leg: No edema.  Skin:    General: Skin is warm and dry.     Findings: No rash.  Neurological:     General: No focal deficit present.  Psychiatric:        Mood and Affect: Mood normal.        Behavior: Behavior normal.     Lab Results  Component Value Date   WBC 8.0 03/23/2019   HGB 12.1 03/23/2019   HCT 36.7 03/23/2019   PLT 185.0 03/23/2019   GLUCOSE 178 (H) 03/23/2019   CHOL 160 03/23/2019   TRIG 89.0 03/23/2019   HDL 57.10 03/23/2019   LDLCALC 85 03/23/2019   ALT 14 03/23/2019   AST 13 03/23/2019   NA 140 03/23/2019   K 3.6 03/23/2019   CL 107 03/23/2019   CREATININE 0.94 03/23/2019   BUN 10 03/23/2019   CO2 25 03/23/2019   TSH 1.56 03/23/2019   INR 1.1 09/14/2008   HGBA1C 8.1 (H) 03/23/2019   MICROALBUR 1.2 06/04/2018    US Renal  Result Date: 07/28/2018 CLINICAL DATA:  Initial evaluation for malignant hypertension EXAM: RENAL / URINARY TRACT ULTRASOUND COMPLETE COMPARISON:  Prior CT from 09/16/2008. FINDINGS: Right Kidney: Renal measurements: 10.2 x 4.8 x 4.3 cm = volume: 110.4 mL . Echogenicity within normal limits. No mass or hydronephrosis visualized. Left Kidney: Renal measurements: 10.6 x 5.3 x 5.3 cm = volume: 155.5 mL. Echogenicity within normal limits. No mass or hydronephrosis visualized. Bladder: Appears normal for degree of bladder distention. IMPRESSION: 1. Mild asymmetry between the kidneys bilaterally, with the left mildly larger than the right as above. 2. Otherwise unremarkable and normal renal ultrasound. No hydronephrosis. Electronically Signed   By: Jeannine Boga M.D.   On: 07/28/2018 21:43    Assessment & Plan:   Prakriti was seen today for hypertension, hyperlipidemia, diabetes and back pain.  Diagnoses and all  orders for this visit:  Malignant hypertension- Her blood pressure is not adequately well controlled.  I have asked her to restart the triamterene/hydrochlorothiazide combination.  I have asked her to stay on the current dose of carvedilol.  I have asked her to restart an ARB. -     CBC with Differential/Platelet; Future -     Urinalysis, Routine w reflex microscopic; Future -     TSH; Future -     carvedilol (COREG) 12.5 MG tablet; Take 1 tablet (12.5 mg total) by mouth 2 (two) times daily with a meal. -     Discontinue: Azilsartan Medoxomil (EDARBI)  80 MG TABS; Take 1 tablet (80 mg total) by mouth daily. -     irbesartan (AVAPRO) 150 MG tablet; Take 1 tablet (150 mg total) by mouth daily.  Dyslipidemia, goal LDL below 100- She has achieved her LDL goal is doing well on the statin. -     Lipid panel; Future -     TSH; Future -     Hepatic function panel; Future  Vitamin D deficiency disease- Her vitamin D level is normal. -     VITAMIN D 25 Hydroxy (Vit-D Deficiency, Fractures); Future  Diuretic-induced hypokalemia -     Basic metabolic panel; Future -     triamterene-hydrochlorothiazide (DYAZIDE) 37.5-25 MG capsule; Take 1 each (1 capsule total) by mouth daily.  Diabetes mellitus without complication ([place])- Her I5O is at 8.1%.  She has not achieved glycemic control.  I have asked her to restart the SGLT 2/Metformin combination.  I anticipate increasing the dose over the next few months. -     Hemoglobin A1c; Future -     Discontinue: Azilsartan Medoxomil (EDARBI) 80 MG TABS; Take 1 tablet (80 mg total) by mouth daily. -     Empagliflozin-metFORMIN HCl ER (SYNJARDY XR) 05-999 MG TB24; Take 1 tablet by mouth daily. -     irbesartan (AVAPRO) 150 MG tablet; Take 1 tablet (150 mg total) by mouth daily.  Left lumbar radiculitis -     Ambulatory referral to Sports Medicine   I have discontinued Nakiya R. Levitan's Azilsartan Medoxomil, Optimal-D, and Edarbi. I have also changed her  triamterene-hydrochlorothiazide. Additionally, I am having her start on irbesartan. Lastly, I am having her maintain her levonorgestrel, atorvastatin, carvedilol, and Synjardy XR.  Meds ordered this encounter  Medications  . triamterene-hydrochlorothiazide (DYAZIDE) 37.5-25 MG capsule    Sig: Take 1 each (1 capsule total) by mouth daily.    Dispense:  90 capsule    Refill:  0  . carvedilol (COREG) 12.5 MG tablet    Sig: Take 1 tablet (12.5 mg total) by mouth 2 (two) times daily with a meal.    Dispense:  180 tablet    Refill:  1  . DISCONTD: Azilsartan Medoxomil (EDARBI) 80 MG TABS    Sig: Take 1 tablet (80 mg total) by mouth daily.    Dispense:  90 tablet    Refill:  0  . Empagliflozin-metFORMIN HCl ER (SYNJARDY XR) 05-999 MG TB24    Sig: Take 1 tablet by mouth daily.    Dispense:  90 tablet    Refill:  0  . irbesartan (AVAPRO) 150 MG tablet    Sig: Take 1 tablet (150 mg total) by mouth daily.    Dispense:  90 tablet    Refill:  1     Follow-up: Return in about 6 weeks (around 05/04/2019).  Scarlette Calico, MD

## 2019-04-02 ENCOUNTER — Other Ambulatory Visit: Payer: Self-pay | Admitting: Internal Medicine

## 2019-04-02 DIAGNOSIS — E119 Type 2 diabetes mellitus without complications: Secondary | ICD-10-CM

## 2019-04-02 DIAGNOSIS — I10 Essential (primary) hypertension: Secondary | ICD-10-CM

## 2019-04-29 ENCOUNTER — Other Ambulatory Visit: Payer: Self-pay | Admitting: Internal Medicine

## 2019-04-29 DIAGNOSIS — Z1231 Encounter for screening mammogram for malignant neoplasm of breast: Secondary | ICD-10-CM

## 2019-05-06 ENCOUNTER — Telehealth: Payer: Self-pay | Admitting: Internal Medicine

## 2019-05-06 NOTE — Telephone Encounter (Signed)
Copied from [place] [id number]. Topic: Quick Communication - Rx Refill/Question >> May 06, 2019 10:59 AM Yvette Rack wrote: Medication: Azilsartan Medoxomil (EDARBI) 80 MG TABS  Has the patient contacted their pharmacy? yes   Preferred Pharmacy (with phone number or street name): [facility] ([address]), [place] [id number] (Phone)  [phone] (Fax)  Agent: Please be advised that RX refills may take up to 3 business days. We ask that you follow-up with your pharmacy.

## 2019-05-06 NOTE — Telephone Encounter (Signed)
Contacted pt and she stated that her Kidney doctor stopped the triamterene/HCTZ and started her on Amlodipine 2.5 mg and Hydrochlorothiazide 25 mg.  Last BP (Monday) 122/90.  Pt informed to follow instructions from Kidney Dr and monitor BP. Pt to call us if she begins to have any sx. Pt stated understanding.

## 2019-05-06 NOTE — Telephone Encounter (Signed)
Medication not on current medication list. 

## 2019-05-12 ENCOUNTER — Encounter: Payer: Self-pay | Admitting: Gynecology

## 2019-06-10 ENCOUNTER — Ambulatory Visit
Admission: RE | Admit: 2019-06-10 | Discharge: 2019-06-10 | Disposition: A | Discharge status: Home / Self Care | Payer: 59 | Source: Ambulatory Visit | Attending: Internal Medicine | Admitting: Internal Medicine

## 2019-06-10 ENCOUNTER — Other Ambulatory Visit: Payer: Self-pay

## 2019-06-10 DIAGNOSIS — Z1231 Encounter for screening mammogram for malignant neoplasm of breast: Secondary | ICD-10-CM

## 2019-06-11 LAB — HM MAMMOGRAPHY

## 2019-07-22 ENCOUNTER — Telehealth: Payer: Self-pay

## 2019-07-22 NOTE — Telephone Encounter (Signed)
Copied from [place] [phone]. Topic: General - Inquiry >> Jul 22, 2019  2:47 PM Percell Belt A wrote: Reason for CRM: pt lost her job and she does not have ins and the Empagliflozin-metFORMIN HCl ER (SYNJARDY XR) 05-999 MG TB24 [id number]  is $700 and she can not afford this.  She is trying to get ins but in the mean time she would like to know if there is any samples she could get or change this med to something she can afford?   Best number  [phone]

## 2019-07-22 NOTE — Telephone Encounter (Signed)
Pt contacted and informed that we have 4 boxes for her. Pt stated she would be in on 12.3.2020 to pick up.

## 2019-07-23 ENCOUNTER — Telehealth: Payer: Self-pay

## 2019-07-23 NOTE — Telephone Encounter (Signed)
Copied from [place] [id number]. Topic: General - Inquiry >> Jul 22, 2019  2:47 PM Percell Belt A wrote: Reason for CRM: pt lost her job and she does not have ins and the Empagliflozin-metFORMIN HCl ER (SYNJARDY XR) 05-999 MG TB24 [id number]  is $700 and she can not afford this.  She is trying to get ins but in the mean time she would like to know if there is any samples she could get or change this med to something she can afford?   Best number  [phone]

## 2019-07-23 NOTE — Telephone Encounter (Signed)
I have samples on my desk for patient to pick up at her convenience.

## 2019-07-26 ENCOUNTER — Other Ambulatory Visit: Payer: Self-pay | Admitting: Internal Medicine

## 2019-07-26 DIAGNOSIS — E785 Hyperlipidemia, unspecified: Secondary | ICD-10-CM

## 2019-08-18 ENCOUNTER — Other Ambulatory Visit: Payer: Self-pay | Admitting: Internal Medicine

## 2019-08-18 DIAGNOSIS — E119 Type 2 diabetes mellitus without complications: Secondary | ICD-10-CM

## 2019-08-18 MED ORDER — SYNJARDY XR 10-1000 MG PO TB24: 1.0000 | ORAL_TABLET | Freq: Every day | ORAL | 0 refills | Status: DC

## 2019-08-18 NOTE — Telephone Encounter (Signed)
Medication Refill - Medication:  Empagliflozin-metFORMIN HCl ER (SYNJARDY XR) 05-999 MG TB24   Has the patient contacted their pharmacy?  Yes advised to call office.   Preferred Pharmacy (with phone number or street name):  [facility]  - [place] Phone:  [phone]  Fax:  [phone]     Agent: Please be advised that RX refills may take up to 3 business days. We ask that you follow-up with your pharmacy.

## 2019-08-24 ENCOUNTER — Telehealth: Payer: Self-pay | Admitting: Internal Medicine

## 2019-08-24 NOTE — Telephone Encounter (Signed)
Medication Refill - Medication: Empagliflozin-metFORMIN HCl ER (SYNJARDY XR) 05-999 MG TB24   Pt needs a cheaper substitute for medication. It is too expensive for her, does not have insurance. She lost her job.   Has the patient contacted their pharmacy? Yes.   (Agent: If no, request that the patient contact the pharmacy for the refill.) (Agent: If yes, when and what did the pharmacy advise?)  Preferred Pharmacy (with phone number or street name):  [facility] ([address] [place] [address]  [phone] [place] ([place]) [place] [address]  Phone: [phone] Fax: [phone]     Agent: Please be advised that RX refills may take up to 3 business days. We ask that you follow-up with your pharmacy.

## 2019-08-24 NOTE — Telephone Encounter (Signed)
Pt is requesting a cheaper alternative for the Synjardy. Please advise.

## 2019-08-28 ENCOUNTER — Other Ambulatory Visit: Payer: Self-pay | Admitting: Internal Medicine

## 2019-08-28 DIAGNOSIS — E119 Type 2 diabetes mellitus without complications: Secondary | ICD-10-CM

## 2019-08-28 MED ORDER — METFORMIN HCL ER 750 MG PO TB24: 1500.0000 mg | ORAL_TABLET | Freq: Every day | ORAL | 1 refills | Status: DC

## 2019-08-29 NOTE — Telephone Encounter (Signed)
Pt contacted and informed of same. Pt stated understanding.  

## 2020-03-03 ENCOUNTER — Other Ambulatory Visit: Payer: Self-pay | Admitting: Internal Medicine

## 2020-03-03 DIAGNOSIS — E119 Type 2 diabetes mellitus without complications: Secondary | ICD-10-CM

## 2020-03-03 DIAGNOSIS — E785 Hyperlipidemia, unspecified: Secondary | ICD-10-CM

## 2020-10-04 ENCOUNTER — Telehealth: Payer: Self-pay | Admitting: Internal Medicine

## 2020-10-04 ENCOUNTER — Encounter (HOSPITAL_COMMUNITY): Payer: Self-pay | Admitting: Emergency Medicine

## 2020-10-04 ENCOUNTER — Observation Stay (HOSPITAL_COMMUNITY)
Admission: EM | Admit: 2020-10-04 | Discharge: 2020-10-05 | Disposition: A | Discharge status: Home / Self Care | Payer: Self-pay | Attending: Internal Medicine | Admitting: Internal Medicine

## 2020-10-04 ENCOUNTER — Emergency Department (HOSPITAL_COMMUNITY): Payer: Self-pay

## 2020-10-04 ENCOUNTER — Other Ambulatory Visit: Payer: Self-pay

## 2020-10-04 DIAGNOSIS — R778 Other specified abnormalities of plasma proteins: Secondary | ICD-10-CM

## 2020-10-04 DIAGNOSIS — Z7984 Long term (current) use of oral hypoglycemic drugs: Secondary | ICD-10-CM | POA: Insufficient documentation

## 2020-10-04 DIAGNOSIS — Z79899 Other long term (current) drug therapy: Secondary | ICD-10-CM | POA: Insufficient documentation

## 2020-10-04 DIAGNOSIS — I160 Hypertensive urgency: Principal | ICD-10-CM | POA: Insufficient documentation

## 2020-10-04 DIAGNOSIS — I5022 Chronic systolic (congestive) heart failure: Secondary | ICD-10-CM | POA: Insufficient documentation

## 2020-10-04 DIAGNOSIS — E785 Hyperlipidemia, unspecified: Secondary | ICD-10-CM

## 2020-10-04 DIAGNOSIS — R519 Headache, unspecified: Secondary | ICD-10-CM | POA: Insufficient documentation

## 2020-10-04 DIAGNOSIS — R0600 Dyspnea, unspecified: Secondary | ICD-10-CM | POA: Diagnosis present

## 2020-10-04 DIAGNOSIS — I10 Essential (primary) hypertension: Secondary | ICD-10-CM

## 2020-10-04 DIAGNOSIS — I110 Hypertensive heart disease with heart failure: Secondary | ICD-10-CM | POA: Insufficient documentation

## 2020-10-04 DIAGNOSIS — R0609 Other forms of dyspnea: Secondary | ICD-10-CM | POA: Diagnosis present

## 2020-10-04 DIAGNOSIS — E119 Type 2 diabetes mellitus without complications: Secondary | ICD-10-CM | POA: Insufficient documentation

## 2020-10-04 DIAGNOSIS — Z20822 Contact with and (suspected) exposure to covid-19: Secondary | ICD-10-CM | POA: Insufficient documentation

## 2020-10-04 LAB — CBC
HCT: 42.8 % (ref 36.0–46.0)
HCT: 40.1 % (ref 36.0–46.0)
Hemoglobin: 14.1 g/dL (ref 12.0–15.0)
Hemoglobin: 13.1 g/dL (ref 12.0–15.0)
MCH: 24.4 pg — ABNORMAL LOW (ref 26.0–34.0)
MCH: 24.1 pg — ABNORMAL LOW (ref 26.0–34.0)
MCHC: 32.9 g/dL (ref 30.0–36.0)
MCHC: 32.7 g/dL (ref 30.0–36.0)
MCV: 73.9 fL — ABNORMAL LOW (ref 80.0–100.0)
MCV: 73.7 fL — ABNORMAL LOW (ref 80.0–100.0)
Platelets: 234 10*3/uL (ref 150–400)
Platelets: 232 10*3/uL (ref 150–400)
RBC: 5.79 MIL/uL — ABNORMAL HIGH (ref 3.87–5.11)
RBC: 5.44 MIL/uL — ABNORMAL HIGH (ref 3.87–5.11)
RDW: 16.8 % — ABNORMAL HIGH (ref 11.5–15.5)
RDW: 16.6 % — ABNORMAL HIGH (ref 11.5–15.5)
WBC: 7.4 10*3/uL (ref 4.0–10.5)
WBC: 7.6 10*3/uL (ref 4.0–10.5)
nRBC: 0 % (ref 0.0–0.2)
nRBC: 0 % (ref 0.0–0.2)

## 2020-10-04 LAB — CBG MONITORING, ED: Glucose-Capillary: 148 mg/dL — ABNORMAL HIGH (ref 70–99)

## 2020-10-04 LAB — TROPONIN I (HIGH SENSITIVITY)
Troponin I (High Sensitivity): 19 ng/L — ABNORMAL HIGH (ref <18)
Troponin I (High Sensitivity): 22 ng/L — ABNORMAL HIGH (ref <18)

## 2020-10-04 LAB — BASIC METABOLIC PANEL
Anion gap: 9 (ref 5–15)
BUN: 14 mg/dL (ref 6–20)
CO2: 25 mmol/L (ref 22–32)
Calcium: 9.3 mg/dL (ref 8.9–10.3)
Chloride: 105 mmol/L (ref 98–111)
Creatinine, Ser: 1.06 mg/dL — ABNORMAL HIGH (ref 0.44–1.00)
GFR, Estimated: >60 mL/min (ref >60)
Glucose, Bld: 148 mg/dL — ABNORMAL HIGH (ref 70–99)
Potassium: 3.7 mmol/L (ref 3.5–5.1)
Sodium: 139 mmol/L (ref 135–145)

## 2020-10-04 LAB — RESP PANEL BY RT-PCR (FLU A&B, COVID) ARPGX2
Influenza A by PCR: NEGATIVE
Influenza B by PCR: NEGATIVE
SARS Coronavirus 2 by RT PCR: NEGATIVE

## 2020-10-04 LAB — I-STAT BETA HCG BLOOD, ED (MC, WL, AP ONLY): I-stat hCG, quantitative: <5 m[IU]/mL (ref <5)

## 2020-10-04 LAB — HIV ANTIBODY (ROUTINE TESTING W REFLEX): HIV Screen 4th Generation wRfx: NONREACTIVE

## 2020-10-04 LAB — BRAIN NATRIURETIC PEPTIDE: B Natriuretic Peptide: 118.3 pg/mL — ABNORMAL HIGH (ref 0.0–100.0)

## 2020-10-04 LAB — GLUCOSE, CAPILLARY: Glucose-Capillary: 114 mg/dL — ABNORMAL HIGH (ref 70–99)

## 2020-10-04 MED ORDER — ASPIRIN 81 MG PO CHEW
324.0000 mg | CHEWABLE_TABLET | Freq: Once | ORAL | Status: AC
  Administered 2020-10-04: 324 mg via ORAL
  Filled 2020-10-04: qty 4

## 2020-10-04 MED ORDER — NITROGLYCERIN 2 % TD OINT
0.5000 [in_us] | TOPICAL_OINTMENT | Freq: Once | TRANSDERMAL | Status: AC
  Administered 2020-10-04: 0.5 [in_us] via TOPICAL
  Filled 2020-10-04: qty 1

## 2020-10-04 MED ORDER — IRBESARTAN 150 MG PO TABS
150.0000 mg | ORAL_TABLET | Freq: Every day | ORAL | Status: DC
  Administered 2020-10-04 – 2020-10-05 (×2): 150 mg via ORAL
  Filled 2020-10-04 (×2): qty 1

## 2020-10-04 MED ORDER — ENOXAPARIN SODIUM 40 MG/0.4ML ~~LOC~~ SOLN
40.0000 mg | SUBCUTANEOUS | Status: DC
  Administered 2020-10-04: 40 mg via SUBCUTANEOUS
  Filled 2020-10-04: qty 0.4

## 2020-10-04 MED ORDER — ACETAMINOPHEN 650 MG RE SUPP: 650.0000 mg | Freq: Four times a day (QID) | RECTAL | Status: DC | PRN

## 2020-10-04 MED ORDER — ACETAMINOPHEN 325 MG PO TABS
650.0000 mg | ORAL_TABLET | Freq: Four times a day (QID) | ORAL | Status: DC | PRN
  Administered 2020-10-05: 650 mg via ORAL
  Filled 2020-10-04: qty 2

## 2020-10-04 MED ORDER — INSULIN ASPART 100 UNIT/ML ~~LOC~~ SOLN: 0.0000 [IU] | Freq: Three times a day (TID) | SUBCUTANEOUS | Status: DC

## 2020-10-04 MED ORDER — ASPIRIN EC 81 MG PO TBEC
81.0000 mg | DELAYED_RELEASE_TABLET | Freq: Every day | ORAL | Status: DC
  Administered 2020-10-05: 81 mg via ORAL
  Filled 2020-10-04: qty 1

## 2020-10-04 MED ORDER — HYDRALAZINE HCL 20 MG/ML IJ SOLN: 10.0000 mg | INTRAMUSCULAR | Status: DC | PRN

## 2020-10-04 MED ORDER — CARVEDILOL 12.5 MG PO TABS
12.5000 mg | ORAL_TABLET | Freq: Two times a day (BID) | ORAL | Status: DC
  Administered 2020-10-04 – 2020-10-05 (×2): 12.5 mg via ORAL
  Filled 2020-10-04: qty 1
  Filled 2020-10-04: qty 4

## 2020-10-04 NOTE — Telephone Encounter (Signed)
Patient calling complaining of shortness of breath, cough, wheezing, swollen cheeks, and headaches. Patient stopped the BP medication because she was waiting for her appointment and couldn't get a refill. This began after stopping the medication. Transferred to team health for further eval

## 2020-10-04 NOTE — ED Triage Notes (Signed)
Pt reports headache, SOB with exertion, and tingling to L arm and leg since Friday.  Hasn't taken HTN and diabetes meds since October.  No arm drift.

## 2020-10-04 NOTE — ED Notes (Signed)
Pt gone to x -ray. X -ray will bring pt to room when finished

## 2020-10-04 NOTE — ED Provider Notes (Addendum)
[facility] EMERGENCY DEPARTMENT Provider Note   CSN: [id number] Arrival date & time: 10/04/20  1431     History Chief Complaint  Patient presents with  . Shortness of Breath    [name] is a 45 y.o. female.  HPI   Patient presents to the emergency room for evaluation of shortness of breath, headache as well as tingling to her left arm and left leg.  Patient states she feels like she gets short of breath with activity and exertion.  She has some tingling in her left arm and left leg but is not having any trouble with numbness or weakness.  No trouble with her speech or vision.  No trouble with her gait.  Patient states she has not taken any of her blood pressure medications or diabetes medications since October.  Patient states her health insurance ran out.  She was unable to see her doctor.  Patient is trying to exercise regularly and eat well and thought her blood pressure was doing fine however she has not been taking her blood pressure at home.  She was able to get [facility] insurance and has an appointment set up with a doctor.  When she was talking to them on the phone she mentioned the symptoms she was experiencing.  They suggested she come to the emergency room.  Past Medical History:  Diagnosis Date  . Diabetes mellitus    HISTORY OF GESTATIONAL DIABETES  . Hypertension   . IUD    MIRENA INSERTED 01/24/11    Patient Active Problem List   Diagnosis Date Noted  . Exertional dyspnea 10/04/2020  . Left lumbar radiculitis 03/23/2019  . Vitamin D deficiency disease 06/04/2018  . Routine general medical examination at a health care facility 05/15/2017  . Cervical cancer screening 05/15/2017  . Dyslipidemia, goal LDL below 100 04/04/2017  . Snoring 10/25/2016  . Obesity (BMI 30-39.9) 03/09/2014  . Diuretic-induced hypokalemia 02/13/2013  . Diabetes mellitus without complication ([place]) 02/13/9484  . 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 CESAREAN SECTION  2006     OB History    Gravida  2   Para      Term      Preterm      AB  1   Living  2     SAB      IAB      Ectopic      Multiple      Live Births              Family History  Problem Relation Age of Onset  . Diabetes Mother   . Heart disease Mother   . Hypertension Maternal Aunt   . Breast cancer Neg Hx     Social History   Tobacco Use  . Smoking status: Never Smoker  . Smokeless tobacco: Never Used  Vaping Use  . Vaping Use: Never used  Substance Use Topics  . Alcohol use: Yes  . Drug use: No    Home Medications Prior to Admission medications   Medication Sig Start Date End Date Taking? Authorizing Provider  atorvastatin (LIPITOR) 20 MG tablet Take 1 tablet by mouth once daily 07/27/19   Janith Lima, MD  carvedilol (COREG) 12.5 MG tablet Take 1 tablet (12.5 mg total) by mouth 2 (two) times daily with a meal. 03/23/19   Janith Lima, MD  irbesartan (AVAPRO) 150 MG tablet Take 1 tablet (150  mg total) by mouth daily. 03/23/19   Janith Lima, MD  levonorgestrel (MIRENA) 20 MCG/24HR IUD 1 Intra Uterine Device (1 each total) by Intrauterine route once. 01/04/11   Janith Lima, MD  metFORMIN (GLUCOPHAGE-XR) 750 MG 24 hr tablet Take 2 tablets (1,500 mg total) by mouth daily with breakfast. 08/28/19   Janith Lima, MD    Allergies    Patient has no known allergies.  Review of Systems   Review of Systems  All other systems reviewed and are negative.   Physical Exam Updated Vital Signs BP (!) 142/107   Pulse 87   Temp 98.2 F (36.8 C)   Resp 17   SpO2 96%  Elevated blood pressure noted Physical Exam Vitals and nursing note reviewed.  Constitutional:      General: She is not in acute distress.    Appearance: She is well-developed and well-nourished. She is not toxic-appearing or diaphoretic.  HENT:     Head: Normocephalic and atraumatic.     Right Ear: External ear  normal.     Left Ear: External ear normal.     Mouth/Throat:     Mouth: Oropharynx is clear and moist.  Eyes:     General: No scleral icterus.       Right eye: No discharge.        Left eye: No discharge.     Conjunctiva/sclera: Conjunctivae normal.  Neck:     Trachea: No tracheal deviation.  Cardiovascular:     Rate and Rhythm: Normal rate and regular rhythm.     Pulses: Intact distal pulses.  Pulmonary:     Effort: Pulmonary effort is normal. No respiratory distress.     Breath sounds: Normal breath sounds. No stridor. No wheezing or rales.  Abdominal:     General: Bowel sounds are normal. There is no distension.     Palpations: Abdomen is soft.     Tenderness: There is no abdominal tenderness. There is no guarding or rebound.  Musculoskeletal:        General: No tenderness or edema.     Cervical back: Neck supple.  Skin:    General: Skin is warm and dry.     Findings: No rash.  Neurological:     Mental Status: She is alert and oriented to person, place, and time.     Cranial Nerves: No cranial nerve deficit (no facial droop, extraocular movements intact, no slurred speech).     Sensory: No sensory deficit.     Motor: No abnormal muscle tone or seizure activity.     Coordination: Coordination normal.     Deep Tendon Reflexes: Strength normal.     Comments: No pronator drift bilateral upper extrem, able to hold both legs off bed for 5 seconds, sensation intact in all extremities, no visual field cuts, no left or right sided neglect, normal finger-nose exam bilaterally, no nystagmus noted   Psychiatric:        Mood and Affect: Mood and affect normal.     ED Results / Procedures / Treatments   Labs (all labs ordered are listed, but only abnormal results are displayed) Labs Reviewed  BASIC METABOLIC PANEL - Abnormal; Notable for the following components:      Result Value   Glucose, Bld 148 (*)    Creatinine, Ser 1.06 (*)    All other components within normal limits   CBC - Abnormal; Notable for the following components:   RBC 5.79 (*)  MCV 73.9 (*)    MCH 24.4 (*)    RDW 16.8 (*)    All other components within normal limits  CBG MONITORING, ED - Abnormal; Notable for the following components:   Glucose-Capillary 148 (*)    All other components within normal limits  TROPONIN I (HIGH SENSITIVITY) - Abnormal; Notable for the following components:   Troponin I (High Sensitivity) 19 (*)    All other components within normal limits  TROPONIN I (HIGH SENSITIVITY) - Abnormal; Notable for the following components:   Troponin I (High Sensitivity) 22 (*)    All other components within normal limits  RESP PANEL BY RT-PCR (FLU A&B, COVID) ARPGX2  BRAIN NATRIURETIC PEPTIDE  I-STAT BETA HCG BLOOD, ED ([facility], [facility], [facility] ONLY)  CBG MONITORING, ED    EKG EKG Interpretation  Date/Time:  Tuesday October 04 2020 14:36:47 EST Ventricular Rate:  109 PR Interval:  150 QRS Duration: 92 QT Interval:  376 QTC Calculation: 506 R Axis:   -41 Text Interpretation: Sinus tachycardia Biatrial enlargement Left axis deviation Pulmonary disease pattern Minimal voltage criteria for LVH, may be normal variant ( Cornell product ) Nonspecific T wave abnormality Abnormal ECG similar to prior ECG Confirmed by Dorie Rank [phone]) on 10/04/2020 7:32:10 PM   Radiology DG Chest 2 View  Result Date: 10/04/2020 CLINICAL DATA:  Shortness of breath with exertion, headache and shortness of breath for 5 days. EXAM: CHEST - 2 VIEW COMPARISON:  04/04/2017 FINDINGS: Trachea midline. Heart size moderately enlarged similar to the prior study. Lungs are clear. No effusion. On limited assessment no acute skeletal process. IMPRESSION: Stable cardiomegaly. No acute cardiopulmonary disease. Electronically Signed   By: Zetta Bills M.D.   On: 10/04/2020 15:35   CT Head Wo Contrast  Result Date: 10/04/2020 CLINICAL DATA:  Headache, dyspnea on exertion, left arm and leg tingling for 4 days, hypertension,  diabetes EXAM: CT HEAD WITHOUT CONTRAST TECHNIQUE: Contiguous axial images were obtained from the base of the skull through the vertex without intravenous contrast. COMPARISON:  None. FINDINGS: Brain: No acute infarct or hemorrhage. Lateral ventricles and midline structures are unremarkable. No acute extra-axial fluid collections. No mass effect. Vascular: No hyperdense vessel or unexpected calcification. Skull: Normal. Negative for fracture or focal lesion. Sinuses/Orbits: Mucoperiosteal thickening within the maxillary and ethmoid sinuses. Partial opacification left frontal sinus. Other: None. IMPRESSION: 1. Mild paranasal sinus disease. 2. No acute intracranial process. Electronically Signed   By: Randa Ngo M.D.   On: 10/04/2020 17:12    Procedures .Critical Care Performed by: Dorie Rank, MD Authorized by: Dorie Rank, MD   Critical care provider statement:    Critical care time (minutes):  30   Critical care was time spent personally by me on the following activities:  Discussions with consultants, evaluation of patient's response to treatment, examination of patient, ordering and performing treatments and interventions, ordering and review of laboratory studies, ordering and review of radiographic studies, pulse oximetry, re-evaluation of patient's condition, obtaining history from patient or surrogate and review of old charts     Medications Ordered in ED Medications  irbesartan (AVAPRO) tablet 150 mg (150 mg Oral Given 10/04/20 1630)  carvedilol (COREG) tablet 12.5 mg (12.5 mg Oral Given 10/04/20 1615)  nitroGLYCERIN (NITROGLYN) 2 % ointment 0.5 inch (has no administration in time range)  aspirin chewable tablet 324 mg (has no administration in time range)    ED Course  I have reviewed the triage vital signs and the nursing notes.  Pertinent labs &  imaging results that were available during my care of the patient were reviewed by me and considered in my medical decision making (see  chart for details).  Clinical Course as of 10/04/20 1932  Tue Oct 04, 2020  1727 Head CT without acute findings [JK]  1807 Initial troponin elevated at 22 [JK]  2376 Metabolic panel is normal [JK]  1907 Chest x-ray does not show evidence of pulmonary edema.  BNP is still pending [JK]  1913 BP elevated 1702/130s at bedside [JK]    Clinical Course User Index [JK] Dorie Rank, MD   MDM Rules/Calculators/A&P                          Patient presented with complaints of dyspnea on exertion and chest discomfort associated with poorly controlled hypertension.  Patient has not been able to take her medications for a couple of months.  In the ED she was initially extremely hypertensive with blood pressures 170s over 140s.  Patient initially was given several doses of medications.  There was some improvement in her blood pressure down into the 130s and 140s.  Patient does however have elevated troponins.  Chest x-ray is cardiomegaly but no evidence of pulmonary edema.  Patient's BNP is still pending.  I suspect patient is having a hypertensive urgency, troponins could be related to the blood pressure however she may also be having a component of acute coronary syndrome with her hypertension elevated troponins.  I will consult the medical service for admission, serial cardiac enzymes and further management of her blood pressure. Final Clinical Impression(s) / ED Diagnoses Final diagnoses:  Hypertensive urgency  Elevated troponin      Dorie Rank, MD 10/04/20 1932

## 2020-10-04 NOTE — H&P (Signed)
History and Physical    [name] OBS:[id number] DOB: Dec 27, 1975 DOA: 10/04/2020  PCP: Alexis Lima, MD  Patient coming from: Home.  Chief Complaint: Shortness of breath.  HPI: [name] is a 45 y.o. female with history of hypertension, [name] The symptoms have been ongoing for last 3 days. Patient has not been taking her medication since October 2021 after she lost her insurance. Denies any chest pain productive cough fever or chills.  ED Course: In the ER patient blood pressure was more than 476 systolic and EKG showing sinus tachycardia chest x-ray unremarkable high sensitive troponin was 19 and 21. CT head was unremarkable. Patient was started on her ARB and carvedilol by the ER physician and admitted for further observation. Labs show creatinine of 1.06 blood glucose of 148.  Review of Systems: As per HPI, rest all negative.   Past Medical History:  Diagnosis Date  . Diabetes mellitus    HISTORY OF GESTATIONAL DIABETES  . Hypertension   . IUD    MIRENA INSERTED 01/24/11    Past Surgical History:  Procedure Laterality Date  . CESAREAN SECTION  2006     reports that she has never smoked. She has never used smokeless tobacco. She reports current alcohol use. She reports that she does not use drugs.  No Known Allergies  Family History  Problem Relation Age of Onset  . Diabetes Mother   . Heart disease Mother   . Hypertension Maternal Aunt   . Breast cancer Neg Hx     Prior to Admission medications   Medication Sig Start Date End Date Taking? Authorizing Provider  levonorgestrel (MIRENA) 20 MCG/24HR IUD 1 Intra Uterine Device (1 each total) by Intrauterine route once. 01/04/11  Yes Alexis Lima, MD  atorvastatin (LIPITOR)  20 MG tablet Take 1 tablet by mouth once daily Patient not taking: Reported on 10/04/2020 07/27/19   Alexis Lima, MD  carvedilol (COREG) 12.5 MG tablet Take 1 tablet (12.5 mg total) by mouth 2 (two) times daily with a meal. Patient not taking: Reported on 10/04/2020 03/23/19   Alexis Lima, MD  irbesartan (AVAPRO) 150 MG tablet Take 1 tablet (150 mg total) by mouth daily. Patient not taking: Reported on 10/04/2020 03/23/19   Alexis Lima, MD  metFORMIN (GLUCOPHAGE-XR) 750 MG 24 hr tablet Take 2 tablets (1,500 mg total) by mouth daily with breakfast. Patient not taking: Reported on 10/04/2020 08/28/19   Alexis Lima, MD    Physical Exam: Constitutional: Moderately built and nourished. Vitals:   10/04/20 1800 10/04/20 1930 10/04/20 2000 10/04/20 2030  BP: (!) 142/107 (!) 167/123 (!) 154/103 (!) 164/115  Pulse: 87 (!) 104 95 98  Resp: 17 (!) 23 (!) 21 18  Temp:      SpO2: 96% 99% 96% 97%   Eyes: Anicteric no pallor. ENMT: No discharge from the ears eyes nose or mouth. Neck: No mass felt. No neck rigidity. Respiratory: No rhonchi or crepitations. Cardiovascular: S1-S2 heard. Abdomen: Soft nontender bowel sounds present. Musculoskeletal: No edema. Skin: No rash. Neurologic: Alert awake oriented to time place and person. Moves all extremities. Psychiatric: Appears normal. Normal affect.   Labs on Admission: I have personally reviewed following labs and imaging studies  CBC: Recent Labs  Lab 10/04/20 1443  WBC 7.4  HGB 14.1  HCT 42.8  MCV 73.9*  PLT 408   Basic Metabolic Panel: Recent Labs  Lab 10/04/20 1443  NA 139  K 3.7  CL 105  CO2 25  GLUCOSE 148*  BUN 14  CREATININE 1.06*  CALCIUM 9.3   GFR: CrCl cannot be calculated (Unknown ideal weight.). Liver Function Tests: No results for input(s): AST, ALT, ALKPHOS, BILITOT, PROT, ALBUMIN in the last 168 hours. No results for input(s): LIPASE, AMYLASE in the last 168 hours. No results for input(s): AMMONIA in the  last 168 hours. Coagulation Profile: No results for input(s): INR, PROTIME in the last 168 hours. Cardiac Enzymes: No results for input(s): CKTOTAL, CKMB, CKMBINDEX, TROPONINI in the last 168 hours. BNP (last 3 results) No results for input(s): PROBNP in the last 8760 hours. HbA1C: No results for input(s): HGBA1C in the last 72 hours. CBG: Recent Labs  Lab 10/04/20 1440  GLUCAP 148*   Lipid Profile: No results for input(s): CHOL, HDL, LDLCALC, TRIG, CHOLHDL, LDLDIRECT in the last 72 hours. Thyroid Function Tests: No results for input(s): TSH, T4TOTAL, FREET4, T3FREE, THYROIDAB in the last 72 hours. Anemia Panel: No results for input(s): VITAMINB12, FOLATE, FERRITIN, TIBC, IRON, RETICCTPCT in the last 72 hours. Urine analysis:    Component Value Date/Time   COLORURINE YELLOW 03/23/2019 0852   APPEARANCEUR [place] (A) 03/23/2019 0852   LABSPEC >=1.030 (A) 03/23/2019 0852   PHURINE 5.5 03/23/2019 0852   GLUCOSEU NEGATIVE 03/23/2019 0852   HGBUR NEGATIVE 03/23/2019 0852   BILIRUBINUR NEGATIVE 03/23/2019 0852   KETONESUR NEGATIVE 03/23/2019 0852   PROTEINUR 100 (A) 04/04/2017 2030   UROBILINOGEN 0.2 03/23/2019 0852   NITRITE NEGATIVE 03/23/2019 0852   LEUKOCYTESUR NEGATIVE 03/23/2019 0852   Sepsis Labs: @LABRCNTIP (procalcitonin:4,lacticidven:4) )No results found for this or any previous visit (from the past 240 hour(s)).   Radiological Exams on Admission: DG Chest 2 View  Result Date: 10/04/2020 CLINICAL DATA:  Shortness of breath with exertion, headache and shortness of breath for 5 days. EXAM: CHEST - 2 VIEW COMPARISON:  04/04/2017 FINDINGS: Trachea midline. Heart size moderately enlarged similar to the prior study. Lungs are clear. No effusion. On limited assessment no acute skeletal process. IMPRESSION: Stable cardiomegaly. No acute cardiopulmonary disease. Electronically Signed   By: [name] M.D.   On: 10/04/2020 15:35   CT Head Wo Contrast  Result Date:  10/04/2020 CLINICAL DATA:  Headache, dyspnea on exertion, left arm and leg tingling for 4 days, hypertension, diabetes EXAM: CT HEAD WITHOUT CONTRAST TECHNIQUE: Contiguous axial images were obtained from the base of the skull through the vertex without intravenous contrast. COMPARISON:  None. FINDINGS: Brain: No acute infarct or hemorrhage. Lateral ventricles and midline structures are unremarkable. No acute extra-axial fluid collections. No mass effect. Vascular: No hyperdense vessel or unexpected calcification. Skull: Normal. Negative for fracture or focal lesion. Sinuses/Orbits: Mucoperiosteal thickening within the maxillary and ethmoid sinuses. Partial opacification left frontal sinus. Other: None. IMPRESSION: 1. Mild paranasal sinus disease. 2. No acute intracranial process. Electronically Signed   By: Randa Ngo M.D.   On: 10/04/2020 17:12    EKG: Independently reviewed. Sinus tachycardia.  Assessment/Plan Principal Problem:   Exertional dyspnea Active Problems:   Diabetes mellitus without complication (HCC)   Hypertensive urgency    1. Hypertensive urgency likely because of noncompliance with medications ARB and carvedilol was restarted. Will follow blood pressure trends. As needed IV hydralazine also been ordered. 2. Exertional dyspnea could be from uncontrolled hypertension. Will  trend cardiac markers check 2D echo check D-dimer. Follow blood pressure trends. 3. Diabetes mellitus type 2 -used to take Metformin. Check hemoglobin A1c presently on sliding scale coverage.   DVT prophylaxis: Lovenox. Code Status: Full code. Family Communication: Discussed with patient. Disposition Plan: Home. Consults called: None. Admission status: Observation.   Rise Patience MD [facility] Pager [phone].  If 7PM-7AM, please contact night-coverage [email or web address] Password [email or web address]  10/04/2020, 9:00 PM

## 2020-10-05 ENCOUNTER — Other Ambulatory Visit: Payer: Self-pay | Admitting: Internal Medicine

## 2020-10-05 ENCOUNTER — Observation Stay (HOSPITAL_BASED_OUTPATIENT_CLINIC_OR_DEPARTMENT_OTHER): Payer: Self-pay

## 2020-10-05 DIAGNOSIS — I351 Nonrheumatic aortic (valve) insufficiency: Secondary | ICD-10-CM

## 2020-10-05 DIAGNOSIS — E119 Type 2 diabetes mellitus without complications: Secondary | ICD-10-CM

## 2020-10-05 DIAGNOSIS — R0602 Shortness of breath: Secondary | ICD-10-CM

## 2020-10-05 DIAGNOSIS — E785 Hyperlipidemia, unspecified: Secondary | ICD-10-CM

## 2020-10-05 LAB — BASIC METABOLIC PANEL
Anion gap: 8 (ref 5–15)
BUN: 13 mg/dL (ref 6–20)
CO2: 25 mmol/L (ref 22–32)
Calcium: 9.4 mg/dL (ref 8.9–10.3)
Chloride: 106 mmol/L (ref 98–111)
Creatinine, Ser: 0.93 mg/dL (ref 0.44–1.00)
GFR, Estimated: >60 mL/min (ref >60)
Glucose, Bld: 143 mg/dL — ABNORMAL HIGH (ref 70–99)
Potassium: 4 mmol/L (ref 3.5–5.1)
Sodium: 139 mmol/L (ref 135–145)

## 2020-10-05 LAB — ECHOCARDIOGRAM COMPLETE
Area-P 1/2: 5.13 cm2
Calc EF: 39.6 %
Height: 65 in
P 1/2 time: 848 msec
S' Lateral: 3.8 cm
Single Plane A2C EF: 41.7 %
Single Plane A4C EF: 36 %
Weight: 3555.2 oz

## 2020-10-05 LAB — TROPONIN I (HIGH SENSITIVITY)
Troponin I (High Sensitivity): 20 ng/L — ABNORMAL HIGH (ref <18)
Troponin I (High Sensitivity): 18 ng/L — ABNORMAL HIGH (ref <18)

## 2020-10-05 LAB — D-DIMER, QUANTITATIVE: D-Dimer, Quant: 0.55 ug/mL-FEU — ABNORMAL HIGH (ref 0.00–0.50)

## 2020-10-05 LAB — GLUCOSE, CAPILLARY: Glucose-Capillary: 125 mg/dL — ABNORMAL HIGH (ref 70–99)

## 2020-10-05 LAB — TSH: TSH: 1.171 u[IU]/mL (ref 0.350–4.500)

## 2020-10-05 MED ORDER — METFORMIN HCL ER 750 MG PO TB24: 1500.0000 mg | ORAL_TABLET | Freq: Every day | ORAL | 0 refills | Status: DC

## 2020-10-05 MED ORDER — FUROSEMIDE 40 MG PO TABS: 40.0000 mg | ORAL_TABLET | Freq: Every day | ORAL | 0 refills | Status: DC | PRN

## 2020-10-05 MED ORDER — CARVEDILOL 12.5 MG PO TABS: 12.5000 mg | ORAL_TABLET | Freq: Two times a day (BID) | ORAL | 0 refills | Status: DC

## 2020-10-05 MED ORDER — IRBESARTAN 150 MG PO TABS: 150.0000 mg | ORAL_TABLET | Freq: Every day | ORAL | 0 refills | Status: DC

## 2020-10-05 MED ORDER — ATORVASTATIN CALCIUM 20 MG PO TABS: 20.0000 mg | ORAL_TABLET | Freq: Every day | ORAL | 0 refills | Status: DC

## 2020-10-05 MED ORDER — OXYCODONE HCL 5 MG PO TABS
5.0000 mg | ORAL_TABLET | ORAL | Status: DC | PRN
  Filled 2020-10-05: qty 1

## 2020-10-05 MED FILL — CARVEDILOL 12.5 MG TABLET: 12.5 | 30 days supply | Qty: 60 | Fill #0

## 2020-10-05 MED FILL — FUROSEMIDE 40 MG TABLET: 40 | 30 days supply | Qty: 30 | Fill #0

## 2020-10-05 MED FILL — METFORMIN HCL ER 500 MG TB2: 500 | 30 days supply | Qty: 90 | Fill #0

## 2020-10-05 MED FILL — ATORVASTATIN CALCIUM 20 MG: 20 | 30 days supply | Qty: 30 | Fill #0

## 2020-10-05 MED FILL — IRBESARTAN 150 MG TABLET: 150 | 30 days supply | Qty: 30 | Fill #0

## 2020-10-05 NOTE — Progress Notes (Addendum)
Discharge instructions reviewed and given to pt, pt family/friend at bedside. Pt acknowledge understanding. Pt discharged with meds from [facility] and weight scale from heart failure staff. Pt understands instructions from md to take lasix as needed for 3 lb weight gain, increased swelling in extremities and sob. Discharged with work note provided by md

## 2020-10-05 NOTE — Progress Notes (Signed)
  Echocardiogram 2D Echocardiogram has been performed.  [name] 10/05/2020, 9:37 AM

## 2020-10-05 NOTE — Discharge Summary (Signed)
Physician Discharge Summary  [name]:[id number] DOB: 05-17-1976 DOA: 10/04/2020  PCP: Janith Lima, MD  Admit date: 10/04/2020 Discharge date: 10/05/2020  Admitted From: home Disposition:  home  Recommendations for Outpatient Follow-up:  1. Follow up with PCP in one week as scheduled  Home Health: none Equipment/Devices: none  Discharge Condition: stable CODE STATUS: Full code Diet recommendation: low sodium  HPI: Per admitting MD, [name] is a 45 y.o. female with history of hypertension, [name] The symptoms have been ongoing for last 3 days. Patient has not been taking her medication since October 2021 after she lost her insurance. Denies any chest pain productive cough fever or chills.  Hospital Course / Discharge diagnoses: Principal problem Hypertensive urgency -this is likely in the setting of nonadherence to the medication regimen due to lack of insurance.  Patient was started on Coreg as well as ARB with improvement in her blood pressure and resolution of her symptoms.  She is able to ambulate without difficulties and will be discharged home in stable condition.  Active problems Chronic systolic CHF -2D echo done showed slightly depressed EF of 40-45%, this is likely in the setting of severe untreated high blood pressure.  She has no ACS type symptoms.  She is on Coreg, ARB, will place on Lasix as needed, no significant fluid overload on exam.  She was counseled regarding a low-sodium diet, and advised for daily weights, she does not have a scale but will purchase 1 DM2-refilled home medications prior to discharge Obesity-based on a BMI of 36, patient would benefit from weight loss  Sepsis ruled out   Discharge  Instructions   Allergies as of 10/05/2020   No Known Allergies     Medication List    TAKE these medications   atorvastatin 20 MG tablet Commonly known as: LIPITOR Take 1 tablet (20 mg total) by mouth daily.   carvedilol 12.5 MG tablet Commonly known as: COREG Take 1 tablet (12.5 mg total) by mouth 2 (two) times daily with a meal.   furosemide 40 MG tablet Commonly known as: Lasix Take 1 tablet (40 mg total) by mouth daily as needed.   irbesartan 150 MG tablet Commonly known as: AVAPRO Take 1 tablet (150 mg total) by mouth daily.   levonorgestrel 20 MCG/24HR IUD Commonly known as: MIRENA 1 Intra Uterine Device (1 each total) by Intrauterine route once.   metFORMIN 750 MG 24 hr tablet Commonly known as: GLUCOPHAGE-XR Take 2 tablets (1,500 mg total) by mouth daily with breakfast.       Follow-up Information    Janith Lima, MD Follow up.   Specialty: Internal Medicine Why: Oct 13, 2020 at 0900 Contact information: [place] [address] [phone]        [facility] Follow up.   Contact information: [address] [place] [address] [phone]              Consultations:  None  Procedures/Studies:  DG Chest 2 View  Result Date: 10/04/2020 CLINICAL DATA:  Shortness of breath with exertion, headache and shortness of breath for 5 days. EXAM: CHEST - 2 VIEW COMPARISON:  04/04/2017 FINDINGS: Trachea midline. Heart size moderately enlarged similar to the prior study. Lungs are  clear. No effusion. On limited assessment no acute skeletal process. IMPRESSION: Stable cardiomegaly. No acute cardiopulmonary disease. Electronically Signed   By: Zetta Bills M.D.   On: 10/04/2020 15:35   CT Head Wo Contrast  Result Date: 10/04/2020 CLINICAL DATA:  Headache, dyspnea on exertion, left arm and leg tingling for 4 days, hypertension, diabetes EXAM: CT HEAD WITHOUT CONTRAST TECHNIQUE:  Contiguous axial images were obtained from the base of the skull through the vertex without intravenous contrast. COMPARISON:  None. FINDINGS: Brain: No acute infarct or hemorrhage. Lateral ventricles and midline structures are unremarkable. No acute extra-axial fluid collections. No mass effect. Vascular: No hyperdense vessel or unexpected calcification. Skull: Normal. Negative for fracture or focal lesion. Sinuses/Orbits: Mucoperiosteal thickening within the maxillary and ethmoid sinuses. Partial opacification left frontal sinus. Other: None. IMPRESSION: 1. Mild paranasal sinus disease. 2. No acute intracranial process. Electronically Signed   By: Randa Ngo M.D.   On: 10/04/2020 17:12   ECHOCARDIOGRAM COMPLETE  Result Date: 10/05/2020    ECHOCARDIOGRAM REPORT   Patient Name:   [name] Date of Exam: 10/05/2020 Medical Rec #:  [id number]           Height:       65.0 in Accession #:    [id number]          Weight:       222.2 lb Date of Birth:  Nov 09, 1975           BSA:          2.069 m Patient Age:    20 years            BP:           161/118 mmHg Patient Gender: F                   HR:           88 bpm. Exam Location:  Inpatient Procedure: 2D Echo, 3D Echo, Cardiac Doppler and Color Doppler Indications:    R06.02 SOB  History:        Patient has prior history of Echocardiogram examinations, most                 recent 04/27/2009. Signs/Symptoms:Shortness of Breath and Dyspnea;                 Risk Factors:Hypertension, Diabetes and Dyslipidemia.  Sonographer:    Roseanna Rainbow RDCS Referring Phys: Doyle  Sonographer Comments: Image acquisition challenging due to patient body habitus. IMPRESSIONS  1. Left ventricular ejection fraction, by estimation, is 40 to 45%. The left ventricle has mildly decreased function. The left ventricle demonstrates global hypokinesis. There is moderate left ventricular hypertrophy. Left ventricular diastolic parameters are consistent with Grade II diastolic  dysfunction (pseudonormalization). Elevated left atrial pressure.  2. Right ventricular systolic function is normal. The right ventricular size is normal. Tricuspid regurgitation signal is inadequate for assessing PA pressure.  3. The mitral valve is normal in structure. Trivial mitral valve regurgitation.  4. The aortic valve was not well visualized. Aortic valve regurgitation is mild. No aortic stenosis is present.  5. The inferior vena cava is normal in size with greater than 50% respiratory variability, suggesting right atrial pressure of 3 mmHg. FINDINGS  Left Ventricle: Left ventricular ejection fraction, by estimation, is 40 to 45%. The left ventricle has mildly decreased function. The left ventricle demonstrates global hypokinesis. The left ventricular internal cavity size was normal in size. There  is  moderate left ventricular hypertrophy. Left ventricular diastolic parameters are consistent with Grade II diastolic dysfunction (pseudonormalization). Elevated left atrial pressure. Right Ventricle: The right ventricular size is normal. No increase in right ventricular wall thickness. Right ventricular systolic function is normal. Tricuspid regurgitation signal is inadequate for assessing PA pressure. Left Atrium: Left atrial size was normal in size. Right Atrium: Right atrial size was normal in size. Pericardium: Trivial pericardial effusion is present. Mitral Valve: The mitral valve is normal in structure. Trivial mitral valve regurgitation. Tricuspid Valve: The tricuspid valve is normal in structure. Tricuspid valve regurgitation is trivial. Aortic Valve: The aortic valve was not well visualized. Aortic valve regurgitation is mild. Aortic regurgitation PHT measures 848 msec. No aortic stenosis is present. Pulmonic Valve: The pulmonic valve was not well visualized. Pulmonic valve regurgitation is not visualized. Aorta: The aortic root and ascending aorta are structurally normal, with no evidence of  dilitation. Venous: The inferior vena cava is normal in size with greater than 50% respiratory variability, suggesting right atrial pressure of 3 mmHg. IAS/Shunts: The interatrial septum was not well visualized.  LEFT VENTRICLE PLAX 2D LVIDd:         4.50 cm      Diastology LVIDs:         3.80 cm      LV e' medial:    5.11 cm/s LV PW:         1.50 cm      LV E/e' medial:  17.5 LV IVS:        1.30 cm      LV e' lateral:   5.15 cm/s LVOT diam:     2.30 cm      LV E/e' lateral: 17.4 LV SV:         72 LV SV Index:   35 LVOT Area:     4.15 cm                              3D Volume EF: LV Volumes (MOD)            3D EF:        39 % LV vol d, MOD A2C: 101.0 ml LV EDV:       179 ml LV vol d, MOD A4C: 97.3 ml  LV ESV:       110 ml LV vol s, MOD A2C: 58.9 ml  LV SV:        69 ml LV vol s, MOD A4C: 62.3 ml LV SV MOD A2C:     42.1 ml LV SV MOD A4C:     97.3 ml LV SV MOD BP:      39.6 ml RIGHT VENTRICLE             IVC RV S prime:     10.40 cm/s  IVC diam: 0.90 cm TAPSE (M-mode): 1.5 cm LEFT ATRIUM             Index       RIGHT ATRIUM          Index LA diam:        2.70 cm 1.31 cm/m  RA Area:     8.90 cm LA Vol (A2C):   70.8 ml 34.23 ml/m RA Volume:   17.40 ml 8.41 ml/m LA Vol (A4C):   44.3 ml 21.42 ml/m LA Biplane Vol: 60.4 ml 29.20 ml/m  AORTIC VALVE LVOT Vmax:   97.00  cm/s LVOT Vmean:  69.600 cm/s LVOT VTI:    0.173 m AI PHT:      848 msec  AORTA Ao Root diam: 3.10 cm Ao Asc diam:  3.50 cm MITRAL VALVE MV Area (PHT): 5.13 cm    SHUNTS MV Decel Time: 148 msec    Systemic VTI:  0.17 m MV E velocity: 89.50 cm/s  Systemic Diam: 2.30 cm MV A velocity: 64.10 cm/s MV E/A ratio:  1.40 Oswaldo Milian MD Electronically signed by Oswaldo Milian MD Signature Date/Time: 10/05/2020/11:47:52 AM    Final       Subjective: - no chest pain, shortness of breath, no abdominal pain, nausea or vomiting.   Discharge Exam: BP (!) 134/94 (BP Location: Left Arm)   Pulse 95   Temp 98.2 F (36.8 C) (Oral)   Resp 16   Ht  5\' 5"  (1.651 m)   Wt 100.8 kg   SpO2 94%   BMI 36.98 kg/m   General: Pt is alert, awake, not in acute distress Cardiovascular: RRR, S1/S2 +, no rubs, no gallops Respiratory: CTA bilaterally, no wheezing, no rhonchi Abdominal: Soft, NT, ND, bowel sounds + Extremities: no edema, no cyanosis    The results of significant diagnostics from this hospitalization (including imaging, microbiology, ancillary and laboratory) are listed below for reference.     Microbiology: Recent Results (from the past 240 hour(s))  Resp Panel by RT-PCR (Flu A&B, Covid) Nasopharyngeal Swab     Status: None   Collection Time: 10/04/20  8:30 PM   Specimen: Nasopharyngeal Swab; Nasopharyngeal(NP) swabs in vial transport medium  Result Value Ref Range Status   SARS Coronavirus 2 by RT PCR NEGATIVE NEGATIVE Final    Comment: (NOTE) SARS-CoV-2 target nucleic acids are NOT DETECTED.  The SARS-CoV-2 RNA is generally detectable in upper respiratory specimens during the acute phase of infection. The lowest concentration of SARS-CoV-2 viral copies this assay can detect is 138 copies/mL. A negative result does not preclude SARS-Cov-2 infection and should not be used as the sole basis for treatment or other patient management decisions. A negative result may occur with  improper specimen collection/handling, submission of specimen other than nasopharyngeal swab, presence of viral mutation(s) within the areas targeted by this assay, and inadequate number of viral copies(<138 copies/mL). A negative result must be combined with clinical observations, patient history, and epidemiological information. The expected result is Negative.  Fact Sheet for Patients:  EntrepreneurPulse.com.au  Fact Sheet for Healthcare Providers:  IncredibleEmployment.be  This test is no t yet approved or cleared by the [place] FDA and  has been authorized for detection and/or diagnosis of  SARS-CoV-2 by FDA under an Emergency Use Authorization (EUA). This EUA will remain  in effect (meaning this test can be used) for the duration of the COVID-19 declaration under Section 564(b)(1) of the Act, 21 U.S.C.section 360bbb-3(b)(1), unless the authorization is terminated  or revoked sooner.       Influenza A by PCR NEGATIVE NEGATIVE Final   Influenza B by PCR NEGATIVE NEGATIVE Final    Comment: (NOTE) The Xpert Xpress SARS-CoV-2/FLU/RSV plus assay is intended as an aid in the diagnosis of influenza from Nasopharyngeal swab specimens and should not be used as a sole basis for treatment. Nasal washings and aspirates are unacceptable for Xpert Xpress SARS-CoV-2/FLU/RSV testing.  Fact Sheet for Patients: EntrepreneurPulse.com.au  Fact Sheet for Healthcare Providers: IncredibleEmployment.be  This test is not yet approved or cleared by the [place] FDA and has been authorized for detection  and/or diagnosis of SARS-CoV-2 by FDA under an Emergency Use Authorization (EUA). This EUA will remain in effect (meaning this test can be used) for the duration of the COVID-19 declaration under Section 564(b)(1) of the Act, 21 U.S.C. section 360bbb-3(b)(1), unless the authorization is terminated or revoked.  Performed at [facility], [place] [address], [place], [place] [address]      Labs: Basic Metabolic Panel: Recent Labs  Lab 10/04/20 1443 10/05/20 0628  NA 139 139  K 3.7 4.0  CL 105 106  CO2 25 25  GLUCOSE 148* 143*  BUN 14 13  CREATININE 1.06* 0.93  CALCIUM 9.3 9.4   Liver Function Tests: No results for input(s): AST, ALT, ALKPHOS, BILITOT, PROT, ALBUMIN in the last 168 hours. CBC: Recent Labs  Lab 10/04/20 1443 10/04/20 2142  WBC 7.4 7.6  HGB 14.1 13.1  HCT 42.8 40.1  MCV 73.9* 73.7*  PLT 234 232   CBG: Recent Labs  Lab 10/04/20 1440 10/04/20 2231 10/05/20 0610  GLUCAP 148* 114* 125*   Hgb A1c No results  for input(s): HGBA1C in the last 72 hours. Lipid Profile No results for input(s): CHOL, HDL, LDLCALC, TRIG, CHOLHDL, LDLDIRECT in the last 72 hours. Thyroid function studies Recent Labs    10/05/20 0628  TSH 1.171   Urinalysis    Component Value Date/Time   COLORURINE YELLOW 03/23/2019 0852   APPEARANCEUR [place] (A) 03/23/2019 0852   LABSPEC >=1.030 (A) 03/23/2019 0852   PHURINE 5.5 03/23/2019 0852   GLUCOSEU NEGATIVE 03/23/2019 0852   HGBUR NEGATIVE 03/23/2019 0852   [place] NEGATIVE 03/23/2019 0852   [place] 03/23/2019 0852   PROTEINUR 100 (A) 04/04/2017 2030   UROBILINOGEN 0.2 03/23/2019 0852   NITRITE NEGATIVE 03/23/2019 0852   LEUKOCYTESUR NEGATIVE 03/23/2019 0852    FURTHER DISCHARGE INSTRUCTIONS:   Get Medicines reviewed and adjusted: Please take all your medications with you for your next visit with your Primary MD   Laboratory/radiological data: Please request your Primary MD to go over all hospital tests and procedure/radiological results at the follow up, please ask your Primary MD to get all Hospital records sent to his/her office.   In some cases, they will be blood work, cultures and biopsy results pending at the time of your discharge. Please request that your primary care M.D. goes through all the records of your hospital data and follows up on these results.   Also Note the following: If you experience worsening of your admission symptoms, develop shortness of breath, life threatening emergency, suicidal or homicidal thoughts you must seek medical attention immediately by calling 911 or calling your MD immediately  if symptoms less severe.   You must read complete instructions/literature along with all the possible adverse reactions/side effects for all the Medicines you take and that have been prescribed to you. Take any new Medicines after you have completely understood and accpet all the possible adverse reactions/side effects.    Do not  drive when taking Pain medications or sleeping medications (Benzodaizepines)   Do not take more than prescribed Pain, Sleep and Anxiety Medications. It is not advisable to combine anxiety,sleep and pain medications without talking with your primary care practitioner   Special Instructions: If you have smoked or chewed Tobacco  in the last 2 yrs please stop smoking, stop any regular Alcohol  and or any Recreational drug use.   Wear Seat belts while driving.   Please note: You were cared for by a hospitalist during your  hospital stay. Once you are discharged, your primary care physician will handle any further medical issues. Please note that NO REFILLS for any discharge medications will be authorized once you are discharged, as it is imperative that you return to your primary care physician (or establish a relationship with a primary care physician if you do not have one) for your post hospital discharge needs so that they can reassess your need for medications and monitor your lab values.  Time coordinating discharge: 35 minutes  SIGNED:  Marzetta Board, MD, PhD 10/05/2020, 1:13 PM

## 2020-10-05 NOTE — TOC Initial Note (Addendum)
Transition of Care [facility]) - Initial/Assessment Note    Patient Details  Name: [name] MRN: [id number] Date of Birth: 10-21-1975  [facility] CM/SW Contact:    Marilu Favre, RN Phone Number: 10/05/2020, 11:01 AM  Clinical Narrative:                 Patient from home. PCP is Dr Scarlette Calico at [facility] she has not seen PCP since 2020 but she has called and scheduled an appointment.   Discussed TOC Pharmacy and [place] program , NCM will assist with medications at discharge.   NCM called [facility] to see if she could use there services (pharmacy) since PCP is in [employer].   Unfortunately, NCM was told  to use [facility] pharmacy , financial counseling , and assistance with orange card , she would have to establish care with a [facility] provider. NCM was in patient's room with [facility] on phone. Patient voiced understanding, right now she does not want to schedule an appointment with [facility] but would like their information just in case she changes her mind. NCM placed on AVS.   Prescriptions entered in [facility] with no co pay except oxycodone this is not covered by [facility]. TOC will discuss cost with patient.  Expected Discharge Plan: Home/Self Care     Patient Goals and CMS Choice Patient states their goals for this hospitalization and ongoing recovery are:: to return to home CMS Medicare.gov Compare Post Acute Care list provided to:: Patient Choice offered to / list presented to : Patient  Expected Discharge Plan and Services Expected Discharge Plan: Home/Self Care In-house Referral: Financial Counselor Discharge Planning Services: CM [facility] Program,Medication Assistance   Living arrangements for the past 2 months: Single Family Home                 DME Arranged: N/A DME Agency: NA       HH Arranged: NA          Prior Living Arrangements/Services Living arrangements for the past 2 months: Single Family Home Lives  with:: Self Patient language and need for interpreter reviewed:: Yes              Criminal Activity/Legal Involvement Pertinent to Current Situation/Hospitalization: No - Comment as needed  Activities of Daily Living Home Assistive Devices/Equipment: None ADL Screening (condition at time of admission) Patient's cognitive ability adequate to safely complete daily activities?: Yes Is the patient deaf or have difficulty hearing?: No Does the patient have difficulty seeing, even when wearing glasses/contacts?: No Does the patient have difficulty concentrating, remembering, or making decisions?: No Patient able to express need for assistance with ADLs?: Yes Does the patient have difficulty dressing or bathing?: No Independently performs ADLs?: Yes (appropriate for developmental age) Does the patient have difficulty walking or climbing stairs?: No Weakness of Legs: None Weakness of Arms/Hands: None  Permission Sought/Granted   Permission granted to share information with : No              Emotional Assessment Appearance:: Appears stated age Attitude/Demeanor/Rapport: Engaged Affect (typically observed): Accepting Orientation: : Oriented to Self,Oriented to Place,Oriented to  Time,Oriented to Situation Alcohol / Substance Use: Not Applicable Psych Involvement: No (comment)  Admission diagnosis:  Exertional dyspnea [R06.00] Elevated troponin [R77.8] Hypertensive urgency [I16.0] Patient Active Problem List   Diagnosis Date Noted  . Exertional dyspnea 10/04/2020  . Hypertensive urgency 10/04/2020  . Left lumbar radiculitis 03/23/2019  . Vitamin D  deficiency disease 06/04/2018  . Routine general medical examination at a health care facility 05/15/2017  . Cervical cancer screening 05/15/2017  . Dyslipidemia, goal LDL below 100 04/04/2017  . Snoring 10/25/2016  . Obesity (BMI 30-39.9) 03/09/2014  . Diuretic-induced hypokalemia 02/13/2013  . Diabetes mellitus without  complication ([place]) 83/37/4451  . 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 [place] ([place]) [place] [address] Phone: [phone] Fax: [phone]  [place], [address] [place] [address] Phone: [phone] Fax: [phone]  [place], [place] - [address] [place] [address] Phone: [phone] Fax: [phone]  [facility] [place], [place] - [address] [place] [address] Phone: [phone] Fax: [phone]     Social Determinants of Health (SDOH) Interventions    Readmission Risk Interventions No flowsheet data found.

## 2020-10-05 NOTE — Telephone Encounter (Signed)
Noted  

## 2020-10-05 NOTE — Progress Notes (Addendum)
Heart Failure Nurse Navigator Progress Note  PCP: Janith Lima, MD PCP-Cardiologist: none on file Admission Diagnosis: exertional dyspnea Admitted from: home with 47 yo son + 55 yo son  Presentation:   [name] presented with [facility] up exertion, BLE edema, numbness/tingling of extremities and headache. Pt has not taken medications since October, 2021 d/t loss of job/insurance. Pt states she started a FT job with [employer] in November, 2021 and didn't realize she was eligible for insurance until after the new employee window had passed.   ECHO/ LVEF: 40-45%, G2DD  Clinical Course:  Past Medical History:  Diagnosis Date  . Diabetes mellitus    HISTORY OF GESTATIONAL DIABETES  . Hypertension   . IUD    MIRENA INSERTED 01/24/11     Social History   Socioeconomic History  . Marital status: Single    Spouse name: Not on file  . Number of children: Not on file  . Years of education: Not on file  . Highest education level: Not on file  Occupational History  . Not on file  Tobacco Use  . Smoking status: Never Smoker  . Smokeless tobacco: Never Used  Vaping Use  . Vaping Use: Never used  Substance and Sexual Activity  . Alcohol use: Yes  . Drug use: No  . Sexual activity: Yes    Birth control/protection: I.U.D.    Comment: Mirena IUD inserted 2012?,intercourse age 72, more than 5 sexual partners  Other Topics Concern  . Not on file  Social History Narrative  . Not on file   Social Determinants of Health   Financial Resource Strain: Medium Risk  . Difficulty of Paying Living Expenses: Somewhat hard  Food Insecurity: No Food Insecurity  . Worried About Charity fundraiser in the Last Year: Never true  . Ran Out of Food in the Last Year: Never true  Transportation Needs: No Transportation Needs  . Lack of Transportation (Medical): No  . Lack of Transportation (Non-Medical): No  Physical Activity: Sufficiently Active  . Days of Exercise per Week: 3 days  .  Minutes of Exercise per Session: 120 min  Stress: Not on file  Social Connections: Unknown  . Frequency of Communication with Friends and Family: Three times a week  . Frequency of Social Gatherings with Friends and Family: Not on file  . Attends Religious Services: Not on file  . Active Member of Clubs or Organizations: Not on file  . Attends Archivist Meetings: 1 to 4 times per year  . Marital Status: Not on file   High Risk Criteria for Readmission and/or Poor Patient Outcomes:  Heart failure hospital admissions (last 6 months): 1   No Show rate: 11%  Difficult social situation: yes  Demonstrates medication adherence: no  Primary Language: English  Literacy level: able to read/write and comprehend.   Education Assessment and Provision:  Detailed education and instructions provided on heart failure disease management including the following:  Signs and symptoms of Heart Failure When to call the physician Importance of daily weights Low sodium diet Fluid restriction Medication management Anticipated future follow-up appointments  Patient education given on each of the above topics.  Patient acknowledges understanding via teach back method and acceptance of all instructions.  Education Materials:  "Living Better With Heart Failure" Booklet, HF zone tool, & Daily Weight Tracker Tool.  Patient has scale at home: no, given from AHF clinic. Patient has pill box at home: no, declined. Prefers to take from pill  bottle.    Barriers of Care:   -financial -insurance -new diagnosis education  Considerations/Referrals:   Referral made to Heart Failure Pharmacist Stewardship: yes, will see at [place] appt Referral made to Heart & Vascular TOC clinic: yes, HV TOC appt Monday 2/21 @ 10am  Items for Follow-up on DC/TOC: -medication regimen/optimization -diagnosis education -medication cost/no insurance -bills tight with recent illnesses/single mother  Pricilla Holm, RN, BSN Heart Failure Nurse Navigator [phone]

## 2020-10-05 NOTE — Telephone Encounter (Signed)
[facility] FYI   Advised to see PCP within 4 Hours. Patient decided to go to the ED instead.

## 2020-10-06 LAB — HEMOGLOBIN A1C
Hgb A1c MFr Bld: 7.2 % — ABNORMAL HIGH (ref 4.8–5.6)
Mean Plasma Glucose: 160 mg/dL

## 2020-10-10 ENCOUNTER — Other Ambulatory Visit (HOSPITAL_COMMUNITY): Payer: Self-pay | Admitting: Adult Health

## 2020-10-10 ENCOUNTER — Ambulatory Visit (HOSPITAL_COMMUNITY)
Admit: 2020-10-10 | Discharge: 2020-10-10 | Disposition: A | Discharge status: Home / Self Care | Payer: Self-pay | Source: Ambulatory Visit | Attending: Adult Health | Admitting: Adult Health

## 2020-10-10 ENCOUNTER — Encounter (HOSPITAL_COMMUNITY): Payer: Self-pay

## 2020-10-10 ENCOUNTER — Other Ambulatory Visit: Payer: Self-pay

## 2020-10-10 VITALS — BP 162/98 | HR 84 | Wt 226.2 lb

## 2020-10-10 DIAGNOSIS — I10 Essential (primary) hypertension: Secondary | ICD-10-CM

## 2020-10-10 DIAGNOSIS — R0602 Shortness of breath: Secondary | ICD-10-CM | POA: Insufficient documentation

## 2020-10-10 DIAGNOSIS — Z793 Long term (current) use of hormonal contraceptives: Secondary | ICD-10-CM | POA: Insufficient documentation

## 2020-10-10 DIAGNOSIS — I110 Hypertensive heart disease with heart failure: Secondary | ICD-10-CM | POA: Insufficient documentation

## 2020-10-10 DIAGNOSIS — Z833 Family history of diabetes mellitus: Secondary | ICD-10-CM | POA: Insufficient documentation

## 2020-10-10 DIAGNOSIS — I5042 Chronic combined systolic (congestive) and diastolic (congestive) heart failure: Secondary | ICD-10-CM | POA: Insufficient documentation

## 2020-10-10 DIAGNOSIS — Z8249 Family history of ischemic heart disease and other diseases of the circulatory system: Secondary | ICD-10-CM | POA: Insufficient documentation

## 2020-10-10 DIAGNOSIS — E119 Type 2 diabetes mellitus without complications: Secondary | ICD-10-CM | POA: Insufficient documentation

## 2020-10-10 DIAGNOSIS — I5022 Chronic systolic (congestive) heart failure: Secondary | ICD-10-CM

## 2020-10-10 DIAGNOSIS — R0683 Snoring: Secondary | ICD-10-CM | POA: Insufficient documentation

## 2020-10-10 DIAGNOSIS — Z79899 Other long term (current) drug therapy: Secondary | ICD-10-CM | POA: Insufficient documentation

## 2020-10-10 DIAGNOSIS — Z7984 Long term (current) use of oral hypoglycemic drugs: Secondary | ICD-10-CM | POA: Insufficient documentation

## 2020-10-10 HISTORY — DX: Heart failure, unspecified: I50.9

## 2020-10-10 LAB — BASIC METABOLIC PANEL
Anion gap: 7 (ref 5–15)
BUN: 13 mg/dL (ref 6–20)
CO2: 31 mmol/L (ref 22–32)
Calcium: 9.6 mg/dL (ref 8.9–10.3)
Chloride: 102 mmol/L (ref 98–111)
Creatinine, Ser: 1.01 mg/dL — ABNORMAL HIGH (ref 0.44–1.00)
GFR, Estimated: >60 mL/min (ref >60)
Glucose, Bld: 116 mg/dL — ABNORMAL HIGH (ref 70–99)
Potassium: 3.8 mmol/L (ref 3.5–5.1)
Sodium: 140 mmol/L (ref 135–145)

## 2020-10-10 MED ORDER — ENTRESTO 49-51 MG PO TABS: 1.0000 | ORAL_TABLET | Freq: Two times a day (BID) | ORAL | 3 refills | Status: DC

## 2020-10-10 MED ORDER — DAPAGLIFLOZIN PROPANEDIOL 10 MG PO TABS: 10.0000 mg | ORAL_TABLET | Freq: Every day | ORAL | 2 refills | Status: DC

## 2020-10-10 MED ORDER — FUROSEMIDE 20 MG PO TABS: 20.0000 mg | ORAL_TABLET | Freq: Every day | ORAL | 0 refills | Status: DC | PRN

## 2020-10-10 MED FILL — ENTRESTO 49 MG-51 MG TABLET: 49-51 | 30 days supply | Qty: 60 | Fill #0

## 2020-10-10 MED FILL — FARXIGA 10 MG TABLET: 10 | 30 days supply | Qty: 30 | Fill #0

## 2020-10-10 MED FILL — FARXIGA 10 MG TABLET: 10 | 30 days supply | Qty: 30 | Fill #0 | Status: TO

## 2020-10-10 MED FILL — ENTRESTO 49 MG-51 MG TABLET: 49-51 | 60 days supply | Qty: 60 | Fill #0 | Status: TO

## 2020-10-10 NOTE — Progress Notes (Signed)
HEART & VASCULAR SERVICES TRANSITION OF CARE CONSULT NOTE     Referring Physician: Dr Renne Crigler  Primary Care: Dr Ronnald Ramp  Primary Cardiologist: None   HPI: [name] was referred to the clinic by Dr Renne Crigler for HF consultation.   Alexis Glasper is a 45 year old with history of HTN, hyperlipidemia, DMII, snores, and chronic combined systolic/diastolic heart failure. She does not drink alcohol or smoke.   Of note she ran out of medical insurance back in October and has been unable to get her medications.   Admitted 10/04/20 with hypertensive urgency and shortness of breath. HS trop 19>20>22. BNP was 118. She had been out meds since October. Started back on coreg and irbesartan.  She was discharged the next day on lasix as needed.    Overall feeling fine. Rarely short of breath. Denies PND/Orthopnea. Appetite ok. Eats lots soup.. Snacks on crackers and chips. No fever or chills. Weight at home 220- 222 pounds. Dances 3 days week. Snores a lot. Taking all medications. She has been taking 40 mg of lasix daily. Works full time nights at [employer]. Lives with her 2 sons.   Cardiac Testing  Echo 09/2020-EF 40-45% RV normal grade II DD Echo 2013-EF 55-60%   Review of Systems: [y] = yes, [ ]  = no   . General: Weight gain [ ] ; Weight loss [ ] ; Anorexia [ ] ; Fatigue [ ] ; Fever [ ] ; Chills [ ] ; Weakness [ ]   . Cardiac: Chest pain/pressure [ ] ; Resting SOB [ ] ; Exertional SOB [Y ]; Orthopnea [ ] ; Pedal Edema [ ] ; Palpitations [ ] ; Syncope [ ] ; Presyncope [ ] ; Paroxysmal nocturnal dyspnea[ ]   . Pulmonary: Cough [ ] ; Wheezing[ ] ; Hemoptysis[ ] ; Sputum [ ] ; Snoring [ ]   . GI: Vomiting[ ] ; Dysphagia[ ] ; Melena[ ] ; Hematochezia [ ] ; Heartburn[ ] ; Abdominal pain [ ] ; Constipation [ ] ; Diarrhea [ ] ; BRBPR [ ]   . GU: Hematuria[ ] ; Dysuria [ ] ; Nocturia[ ]   . Vascular: Pain in legs with walking [ ] ; Pain in feet with lying flat [ ] ; Non-healing sores [ ] ; Stroke [ ] ; TIA [ ] ; Slurred speech [ ] ;  . Neuro: Headaches[ ] ;  Vertigo[ ] ; Seizures[ ] ; Paresthesias[ ] ;Blurred vision [ ] ; Diplopia [ ] ; Vision changes [ ]   . Ortho/Skin: Arthritis [ ] ; Joint pain [ ] ; Muscle pain [ ] ; Joint swelling [ ] ; Back Pain [ Y]; Rash [ ]   . Psych: Depression[ ] ; Anxiety[ ]   . Heme: Bleeding problems [ ] ; Clotting disorders [ ] ; Anemia [ ]   . Endocrine: Diabetes [Y ]; Thyroid dysfunction[ ]    Past Medical History:  Diagnosis Date  . Diabetes mellitus    HISTORY OF GESTATIONAL DIABETES  . Hypertension   . IUD    MIRENA INSERTED 01/24/11    Current Outpatient Medications  Medication Sig Dispense Refill  . atorvastatin (LIPITOR) 20 MG tablet Take 1 tablet (20 mg total) by mouth daily. 30 tablet 0  . carvedilol (COREG) 12.5 MG tablet Take 1 tablet (12.5 mg total) by mouth 2 (two) times daily with a meal. 60 tablet 0  . furosemide (LASIX) 40 MG tablet Take 1 tablet (40 mg total) by mouth daily as needed. 30 tablet 0  . irbesartan (AVAPRO) 150 MG tablet Take 1 tablet (150 mg total) by mouth daily. 30 tablet 0  . levonorgestrel (MIRENA) 20 MCG/24HR IUD 1 Intra Uterine Device (1 each total) by Intrauterine route once. 1 each 0  . metFORMIN (  GLUMETZA) 500 MG (MOD) 24 hr tablet Take 750 mg by mouth daily with breakfast.     No current facility-administered medications for this encounter.    No Known Allergies    Social History   Socioeconomic History  . Marital status: Single    Spouse name: Not on file  . Number of children: Not on file  . Years of education: Not on file  . Highest education level: Not on file  Occupational History  . Not on file  Tobacco Use  . Smoking status: Never Smoker  . Smokeless tobacco: Never Used  Vaping Use  . Vaping Use: Never used  Substance and Sexual Activity  . Alcohol use: Yes  . Drug use: No  . Sexual activity: Yes    Birth control/protection: I.U.D.    Comment: Mirena IUD inserted 2012?,intercourse age 31, more than 5 sexual partners  Other Topics Concern  . Not on file   Social History Narrative  . Not on file   Social Determinants of Health   Financial Resource Strain: Medium Risk  . Difficulty of Paying Living Expenses: Somewhat hard  Food Insecurity: No Food Insecurity  . Worried About Charity fundraiser in the Last Year: Never true  . Ran Out of Food in the Last Year: Never true  Transportation Needs: No Transportation Needs  . Lack of Transportation (Medical): No  . Lack of Transportation (Non-Medical): No  Physical Activity: Sufficiently Active  . Days of Exercise per Week: 3 days  . Minutes of Exercise per Session: 120 min  Stress: Not on file  Social Connections: Unknown  . Frequency of Communication with Friends and Family: Three times a week  . Frequency of Social Gatherings with Friends and Family: Not on file  . Attends Religious Services: Not on file  . Active Member of Clubs or Organizations: Not on file  . Attends Archivist Meetings: 1 to 4 times per year  . Marital Status: Not on file  Intimate Partner Violence: Not on file      Family History  Problem Relation Age of Onset  . Diabetes Mother   . Heart disease Mother   . Hypertension Maternal Aunt   . Breast cancer Neg Hx     Vitals:   10/10/20 1001  BP: (!) 162/98  Pulse: 84  SpO2: 99%  Weight: 102.6 kg (226 lb 4 oz)    PHYSICAL EXAM: General:  Well appearing. No respiratory difficulty HEENT: normal Neck: supple. no JVD. Carotids 2+ bilat; no bruits. No lymphadenopathy or thryomegaly appreciated. Cor: PMI nondisplaced. Regular rate & rhythm. No rubs, gallops or murmurs. Lungs: clear Abdomen: soft, nontender, nondistended. No hepatosplenomegaly. No bruits or masses. Good bowel sounds. Extremities: no cyanosis, clubbing, rash, edema Neuro: alert & oriented x 3, cranial nerves grossly intact. moves all 4 extremities w/o difficulty. Affect pleasant.  ASSESSMENT & PLAN: 1. Chronic Combined Systolic/Diastolic Heart Failure  - ECHO 09/2020 EF 40-45% RV  normal  NYHA II. Volume status stable. Cut back lasix to 20 mg as needed -GDMT as below.  -BB-Continue carvedilol 12.5mg  twice a day  -ARNI- Start enstresto 49-51 mg twice a day. Given 30 day free card and patient assistance form started. -MRA- anticipate adding spiro next visit.  -SGLT2i- start farxiga 10 mg daily . Lasix was cut back to daily. Give 30 day free card.  - check bmet today and in 2 weeks -Discussed daily weights and purpose of medications.    2. DMII -Most recent  Hgb A1C 7.2.  - On metformin  - As above add farxiga 10 mg daily   3. HTN -Uncontrolled. Goal SBP <130 DBP < 80 - Continue current regimen and add entresto.  4. Snores Suspected sleep apnea.  Needs sleep study but will need to wait until she has insurance.   Referred to HFSW (, Medications, Insurance, Financial ): Yes Refer to Pharmacy: Yes Refer to Home Health:  No Refer to Advanced Heart Failure Clinic: Yes  Refer to General Cardiology:  No  Follow up in 2 weeks with APP to continue and titrate HF meds. Suspect she can be referred to general cardiology after HF meds adjusted.   Honesti Seaberg NP-C  12:35 PM

## 2020-10-10 NOTE — Patient Instructions (Addendum)
Stop Irbesartan  Start Entresto 49/51 mg Twice daily   Start Farxiga 10 mg Daily  Take Furosemide 20 mg Only AS NEEDED   Your provider has prescribed Farxiga for you. Please be aware the most common side effect of this medication is urinary tract infections and yeast infections. Please practice good hygiene and keep this area clean and dry to help prevent this. If you do begin to have symptoms of these infections, such as difficulty urinating or painful urination,  please let us know.  Labs done today, your results will be available in MyChart, we will contact you for abnormal readings.  Thank you for allowing [place] to provider your heart failure care after your recent hospitalization. Please follow-up with our [facility] in 2 weeks  If you have any questions or concerns before your next appointment please send [place] a message through [place] or call our office at [phone].    TO LEAVE A MESSAGE FOR THE NURSE SELECT OPTION 2, PLEASE LEAVE A MESSAGE INCLUDING: . YOUR NAME . DATE OF BIRTH . CALL BACK NUMBER . REASON FOR CALL**this is important as we prioritize the call backs  YOU WILL RECEIVE A CALL BACK THE SAME DAY AS LONG AS YOU CALL BEFORE 4:00 PM

## 2020-10-10 NOTE — Progress Notes (Signed)
Heart and Vascular Care Navigation  10/10/2020  [name] 23-Nov-1975 [id number]  Reason for Referral: Patient seen in [facility] clinic.                                                                                                    Assessment: Patient lives at home with her 2 sons. Patient reports she is working for [employer] and unfortunately missed the window for enrollment in health insurance. Patient denies any other SDoH concerns at this time but needs insurance and PCP follow up.                                     HRT/VAS Care Coordination    Living arrangements for the past 2 months Single Family Home   Lives with: Self   Home Assistive Devices/Equipment None   DME Agency NA      Social History:                                                                             SDOH Screenings   Alcohol Screen: Low Risk   . Last Alcohol Screening Score (AUDIT): 0  Depression (PHQ2-9): Not on file  Financial Resource Strain: Medium Risk  . Difficulty of Paying Living Expenses: Somewhat hard  Food Insecurity: No Food Insecurity  . Worried About Charity fundraiser in the Last Year: Never true  . Ran Out of Food in the Last Year: Never true  Housing: Medium Risk  . Last Housing Risk Score: 1  Physical Activity: Sufficiently Active  . Days of Exercise per Week: 3 days  . Minutes of Exercise per Session: 120 min  Social Connections: Unknown  . Frequency of Communication with Friends and Family: Three times a week  . Frequency of Social Gatherings with Friends and Family: Not on file  . Attends Religious Services: Not on file  . Active Member of Clubs or Organizations: Not on file  . Attends Archivist Meetings: 1 to 4 times per year  . Marital Status: Not on file  Stress: Not on file  Tobacco Use: Low Risk   . Smoking Tobacco Use: Never Smoker  . Smokeless Tobacco Use: Never Used  Transportation Needs: No Transportation Needs  . Lack of Transportation (Medical):  No  . Lack of Transportation (Non-Medical): No    SDOH Interventions: Financial Resources:    Occupational hygienist for [place] Insecurity:   n/a  Housing Insecurity:   n/a  Transportation:    n/a   Other Care Navigation Interventions:     Inpatient/Outpatient Substance Abuse Counseling/Rehab Options n/a  Provided Pharmacy assistance resources  Heart Failure Fund and referral to [employer] for [place]  Card  Patient expressed Mental Health concerns No.  Patient Referred to: Financial Counseling   Follow-up plan:  CSW will refer patient to Financial Counseling for completion of a [facility] Financial Assistance application as well as [employer] and wellness for PCP follow up. Patient grateful for the support. [name], [place], [place]

## 2020-10-10 NOTE — Progress Notes (Addendum)
[facility]  Heart Failure Pharmacist Encounter  HPI:  45 yo F with PMH of HTN, HLD, and diabetes. She presented to the ED with shortness of breath, LE edema, and headache on 10/04/20. She was admitted for hypertensive urgency likely because of noncompliance with medications. She stated she started a new job in November and did not have prescription insurance yet. An ECHO was done on 10/05/20 and LVEF was reduced to 40-45% (LVEF 75% in July 2013). She was then discharged on 10/05/20.  Today, [name] presents to the [facility] for follow up. She denies having shortness of breath, DOE, orthopnea, PND, lightheadedness, or dizziness. She is not fluid overloaded on exam. She has a blood pressure cuff and scale at home but does not have a glucose test meter, strips, or lancets at home.  HF Medications: Furosemide 40 mg daily PRN - has been taking daily Carvedilol 12.5 mg BID Irbesartan 150 mg daily  Has the patient been experiencing any side effects to the medications prescribed?  no  Does the patient have any problems obtaining medications due to transportation or finances?   Yes - no insurance  Understanding of regimen: good Understanding of indications: good Potential of compliance: good Patient understands to avoid NSAIDs. Patient understands to avoid decongestants.   Pertinent Lab Values: . Serum creatinine 0.93, BUN 13, Potassium 4.0, Sodium 139, BNP 118, Magnesium 2.1   Vital Signs: . Weight: 226 lbs (discharge weight: 222 lbs) . Blood pressure: 162/98 mmHg  . Heart rate: 84 bpm   Medication Assistance / Insurance Benefits Check: Does the patient have prescription insurance?  No  Does the patient qualify for medication assistance through manufacturers or grants?   Yes . Eligible grants and/or patient assistance programs: Lisabeth Register . Medication assistance applications in progress: Lisabeth Register  . Medication assistance applications  approved: none Approved medication assistance renewals will be completed by: HF Clinic  Outpatient Pharmacy:  Current outpatient pharmacy: [place] Was the [facility] pharmacy used to supply discharge medications? yes  If TOC pharmacy was used, were the refills transferred out to current pharmacy yet? no - no refills added  Assessment: 1) Chronic systolic CHF (EF 59-29%), due to NICM. NYHA class II symptoms. - Change furosemide to 20 mg daily PRN with starting both Farxiga and Entresto today - Continue carvedilol 12.5 mg BID - Stop irbesartan 150 mg daily - Start Entresto 49/51 mg BID - Start Farxiga 10 mg daily - Consider starting spironolactone at next visit  Plan: 1) Medication changes: - Change furosemide to 20 mg daily PRN - Stop irbesartan 150 mg daily - Start Entresto 49/51 mg BID - Start Farxiga 10 mg daily  2) Patient Assistance: [name] and [place] patient assistance application in process - Patient provided with free 30-day copay cards for both Entresto and [place] CSW assistance with PCP/diabetic supplies  3) Follow up: - Next appointment with HF Clinic in 2 weeks  Kerby Nora, PharmD, [facility] Pharmacist [phone]

## 2020-10-12 ENCOUNTER — Telehealth (HOSPITAL_COMMUNITY): Payer: Self-pay | Admitting: Licensed Clinical Social Worker

## 2020-10-12 NOTE — Telephone Encounter (Signed)
CSW contacted patient to follow up on PCP appointment. Message left for return call. [name], [place], [place]

## 2020-10-12 NOTE — Telephone Encounter (Signed)
CSW followed up with patient regarding [facility] Financial Assistance application and patient states she plans to drop off tomorrow. Patient states she has a PCP appointment tomorrow although concerned because she is uninsured she will have to $80 up front. Patient is unable to work at the moment and unsure about her ability to return to work any time soon. [name], [place], [place]

## 2020-10-13 ENCOUNTER — Telehealth (HOSPITAL_COMMUNITY): Payer: Self-pay | Admitting: Licensed Clinical Social Worker

## 2020-10-13 ENCOUNTER — Ambulatory Visit: Payer: Medicaid Other | Admitting: Internal Medicine

## 2020-10-13 NOTE — Progress Notes (Signed)
  Heart and Vascular Care Navigation  10/13/2020  [name] August 30, 1975 [id number]  Reason for Referral: Follow up- Patient needs follow up with [facility] Financial Assistance application and PCP                                                                                                    Assessment: Patient dropped off [facility] Financial Application and needed documents.                                    HRT/VAS Care Coordination    Living arrangements for the past 2 months Single Family Home   Lives with: Self   Home Assistive Devices/Equipment None   DME Agency NA      Social History:                                                                             SDOH Screenings   Alcohol Screen: Low Risk   . Last Alcohol Screening Score (AUDIT): 0  Depression (PHQ2-9): Not on file  Financial Resource Strain: Medium Risk  . Difficulty of Paying Living Expenses: Somewhat hard  Food Insecurity: No Food Insecurity  . Worried About Charity fundraiser in the Last Year: Never true  . Ran Out of Food in the Last Year: Never true  Housing: Medium Risk  . Last Housing Risk Score: 1  Physical Activity: Sufficiently Active  . Days of Exercise per Week: 3 days  . Minutes of Exercise per Session: 120 min  Social Connections: Unknown  . Frequency of Communication with Friends and Family: Three times a week  . Frequency of Social Gatherings with Friends and Family: Not on file  . Attends Religious Services: Not on file  . Active Member of Clubs or Organizations: Not on file  . Attends Archivist Meetings: 1 to 4 times per year  . Marital Status: Not on file  Stress: Not on file  Tobacco Use: Low Risk   . Smoking Tobacco Use: Never Smoker  . Smokeless Tobacco Use: Never Used  Transportation Needs: No Transportation Needs  . Lack of Transportation (Medical): No  . Lack of Transportation (Non-Medical): No    SDOH Interventions: Financial Resources:    Building control surveyor for [place] Insecurity:     Housing Insecurity:     Transportation:       Follow-up plan:  CSW confirmed PCP appointment with patient for next week at [facility]. Patient dropped off CAFA application and CSW will bring to Financial Counseling office today. Patient verbalizes understanding of follow up and will reach out to CSW as needed. [name], [place], [place]

## 2020-10-18 ENCOUNTER — Ambulatory Visit: Payer: Medicaid Other | Admitting: Family

## 2020-10-18 ENCOUNTER — Ambulatory Visit (INDEPENDENT_AMBULATORY_CARE_PROVIDER_SITE_OTHER): Payer: Self-pay | Admitting: Family

## 2020-10-18 ENCOUNTER — Encounter: Payer: Self-pay | Admitting: Family

## 2020-10-18 ENCOUNTER — Other Ambulatory Visit: Payer: Self-pay

## 2020-10-18 VITALS — BP 121/80 | HR 94 | Ht 63.9 in | Wt 224.0 lb

## 2020-10-18 DIAGNOSIS — Z7689 Persons encountering health services in other specified circumstances: Secondary | ICD-10-CM

## 2020-10-18 DIAGNOSIS — I5022 Chronic systolic (congestive) heart failure: Secondary | ICD-10-CM

## 2020-10-18 DIAGNOSIS — I10 Essential (primary) hypertension: Secondary | ICD-10-CM

## 2020-10-18 NOTE — Progress Notes (Addendum)
Subjective:    [name] - 45 y.o. female MRN [id number]  Date of birth: January 27, 1976  HPI  [name] is to establish care. Patient has a PMH significant for malignant hypertension, hypertensive urgency, diabetes mellitus without complication, left lumbar radiculitis, diuretic-induced hypokalemia, obesity, snoring, dyslipidemia, vitamin D deficiency, and exertional dyspnea.   Current issues and/or concerns: Visit 10/04/2020 - 10/05/2020 at the [facility] per MD note: HPI: Per admitting MD, [name] a 45 y.o.femalewithhistory of hypertension, [name] The symptoms have been ongoing for last 3 days. Patient has not been taking her medication since October 2021 after she lost her insurance. Denies any chest pain productive cough fever or chills.  Hospital Course / Discharge diagnoses: Principal problem Hypertensive urgency -this is likely in the setting of nonadherence to the medication regimen due to lack of insurance.  Patient was started on Coreg as well as ARB with improvement in her blood pressure and resolution of her symptoms.  She is able to ambulate without difficulties and will be discharged home in stable condition.  Active problems Chronic systolic CHF -2D echo done showed slightly depressed EF of 40-45%, this is likely in the setting of severe untreated high blood pressure.  She has no ACS type symptoms.  She is on Coreg, ARB, will place on Lasix as needed, no significant fluid overload on exam.  She was counseled regarding a low-sodium diet, and advised for daily weights, she does not have a scale but will purchase 1 DM2-refilled home medications prior to discharge Obesity-based on a BMI of 36, patient would benefit  from weight loss Sepsis ruled out  Visit 10/10/2020 at the [facility] per MD note: ASSESSMENT & PLAN: 1. Chronic Combined Systolic/Diastolic Heart Failure  - ECHO 09/2020 EF 40-45% RV normal  NYHA II. Volume status stable. Cut back lasix to 20 mg as needed -GDMT as below.  -BB-Continue carvedilol 12.5mg  twice a day  -ARNI- Start enstresto 49-51 mg twice a day. Given 30 day free card and patient assistance form started. -MRA- anticipate adding spiro next visit.  -SGLT2i- start farxiga 10 mg daily . Lasix was cut back to daily. Give 30 day free card.  - check bmet today and in 2 weeks -Discussed daily weights and purpose of medications.    2. DMII -Most recent Hgb A1C 7.2.  - On metformin  - As above add farxiga 10 mg daily   3. HTN -Uncontrolled. Goal SBP <130 DBP < 80 - Continue current regimen and add entresto.  4. Snores Suspected sleep apnea.  Needs sleep study but will need to wait until she has insurance.   Follow up in 2 weeks with APP to continue and titrate HF meds. Suspect she can be referred to general cardiology after HF meds adjusted  Today's visit 10/18/2020 at [place]: Feeling well. Hungry more often lately., then takes a few bites and full. Eating more fresh and frozen veggies.  ROS per HPI   Health Maintenance:  Health Maintenance Due  Topic Date Due  . PNEUMOCOCCAL POLYSACCHARIDE VACCINE AGE 12-64 HIGH RISK  Never done  . COVID-19 Vaccine (1) Never done  . OPHTHALMOLOGY EXAM  03/14/2018  . COLONOSCOPY (Pts 45-81yrs Insurance coverage will need to be confirmed)  Never done  . FOOT EXAM  06/05/2019  . PAP SMEAR-Modifier  08/05/2020     Past Medical History: Patient Active Problem List   Diagnosis Date Noted  . Exertional dyspnea 10/04/2020  . Hypertensive urgency 10/04/2020  . Left lumbar radiculitis 03/23/2019  . Vitamin D deficiency disease 06/04/2018  . Routine general medical  examination at a health care facility 05/15/2017  . Cervical cancer screening 05/15/2017  . Dyslipidemia, goal LDL below 100 04/04/2017  . Snoring 10/25/2016  . Obesity (BMI 30-39.9) 03/09/2014  . Diuretic-induced hypokalemia 02/13/2013  . Diabetes mellitus without complication ([place]) 81/44/8185  . Malignant hypertension 06/01/2009      Social History   reports that she has never smoked. She has never used smokeless tobacco. She reports current alcohol use. She reports that she does not use drugs.   Family History  family history includes Diabetes in her mother; Heart disease in her mother; Hypertension in her maternal aunt.   Medications: reviewed and updated   Objective:   Physical Exam BP 121/80 (BP Location: Left Arm, Patient Position: Sitting)   Pulse 94   Ht 5' 3.9" (1.623 m)   Wt 224 lb (101.6 kg)   SpO2 96%   BMI 38.57 kg/m  Physical Exam Constitutional:      Appearance: She is obese.  HENT:     Head: Normocephalic and atraumatic.  Eyes:     Extraocular Movements: Extraocular movements intact.     Pupils: Pupils are equal, round, and reactive to light.  Cardiovascular:     Rate and Rhythm: Normal rate and regular rhythm.     Pulses: Normal pulses.     Heart sounds: Normal heart sounds.  Pulmonary:     Effort: Pulmonary effort is normal.     Breath sounds: Normal breath sounds.  Musculoskeletal:     Cervical back: Normal range of motion and neck supple.  Neurological:     General: No focal deficit present.     Mental Status: She is alert and oriented to person, place, and time.  Psychiatric:        Mood and Affect: Mood normal.        Behavior: Behavior normal.      Assessment & Plan:  1. Encounter to establish care: - Patient presents today to establish care.  - Return for annual physical examination, labs, and health maintenance. Arrive fasting meaning having had no food and/or nothing to drink for at least 8 hours prior to appointment. Please take  scheduled medications as normal.  2. Essential hypertension: - Blood pressure at goal during today's visit. - Counseled on blood pressure goal of less than 130/80, low-sodium, DASH diet, medication compliance, 150 minutes of moderate intensity exercise per week as tolerated. Discussed medication compliance, adverse effects. - Continue Carvedilol and Entresto as prescribed per Cardiology. - Keep all appointments with Cardiology. - Follow-up with primary provider as needed.   3. Chronic systolic heart failure ([place]): - Patient stable in the office today without shortness of breath, chest pain, palpitations, and headache.  - Continue Lasix, Carvedilol, Entresto, and Farxiga as prescribed per Cardiology. - Keep all appointments with Cardiology. - Follow-up with primary provider as needed.    Patient was given clear instructions to go to Emergency Department or return to medical center if symptoms don't improve, worsen, or new problems develop.The patient verbalized understanding.  I discussed the assessment and treatment plan with the patient. The patient was provided an opportunity to ask questions and all were  answered. The patient agreed with the plan and demonstrated an understanding of the instructions.   The patient was advised to call back or seek an in-person evaluation if the symptoms worsen or if the condition fails to improve as anticipated.    Durene Fruits, NP 10/20/2020, 10:14 PM Primary Care at [facility]

## 2020-10-18 NOTE — Patient Instructions (Signed)
Return for annual physical examination, labs, and health maintenance. Arrive fasting meaning having had no food and/or nothing to drink for at least 8 hours prior to appointment.  Keep appointments with Cardiology. Thank you for choosing Primary Care at [facility] for your medical home!    [name] was seen by Camillia Herter, NP today.   [name]'s primary care provider is Novah Goza Zachery Dauer, NP.   For the best care possible,  you should try to see Durene Fruits, NP whenever you come to clinic.   We look forward to seeing you again soon!  If you have any questions about your visit today,  please call us at [phone]  Or feel free to reach your provider via [place].    Heart Failure, Diagnosis  Heart failure is a condition in which the heart has trouble pumping blood. This may mean that the heart cannot pump enough blood out to the body or that the heart does not fill up with enough blood. For some people with heart failure, fluid may back up into the lungs. There may also be swelling (edema) in the lower legs. Heart failure is usually a long-term (chronic) condition. It is important for you to take good care of yourself and follow the treatment plan from your health care provider. What are the causes? This condition may be caused by:  High blood pressure (hypertension). Hypertension causes the heart muscle to work harder than normal.  Coronary artery disease, or CAD. CAD is the buildup of cholesterol and fat (plaque) in the arteries of the heart.  Heart attack, also called myocardial infarction. This injures the heart muscle, making it hard for the heart to pump blood.  Abnormal heart valves. The valves do not open and close properly, forcing the heart to pump harder to keep the blood flowing.  Heart muscle disease, inflammation, or infection (cardiomyopathy or myocarditis). This is damage to the heart muscle. It can increase the risk of heart  failure.  Lung disease. The heart works harder when the lungs are not healthy. What increases the risk? The risk of heart failure increases as a person ages. This condition is also more likely to develop in people who:  Are obese.  Are female.  Use tobacco or nicotine products.  Abuse alcohol or drugs.  Have taken medicines that can damage the heart, such as chemotherapy drugs.  Have any of these conditions: ? Diabetes. ? Abnormal heart rhythms. ? Thyroid problems. ? Low blood counts (anemia). ? Chronic kidney disease.  Have a family history of heart failure. What are the signs or symptoms? Symptoms of this condition include:  Shortness of breath with activity, such as when climbing stairs.  A cough that does not go away.  Swelling of the feet, ankles, legs, or abdomen.  Losing or gaining weight for no reason.  Trouble breathing when lying flat.  Waking from sleep because of the need to sit up and get more air.  Rapid heartbeat.  Tiredness (fatigue) and loss of energy.  Feeling light-headed, dizzy, or close to fainting.  Nausea or loss of appetite.  Waking up more often during the night to urinate (nocturia).  Confusion. How is this diagnosed? This condition is diagnosed based on:  Your medical history, symptoms, and a physical exam.  Diagnostic tests, which may include: ? Echocardiogram. ? Electrocardiogram (ECG). ? Chest X-ray. ? Blood tests. ? Exercise stress test. ? Cardiac MRI. ? Cardiac catheterization and angiogram. ? Radionuclide scans.  How is this treated? Treatment for this condition is aimed at managing the symptoms of heart failure. Medicines Treatment may include medicines that:  Help lower blood pressure by relaxing (dilating) the blood vessels. These medicines are called ACE inhibitors (angiotensin-converting enzyme), ARBs (angiotensin receptor blockers), or vasodilators.  Cause the kidneys to remove salt and water from the blood  through urination (diuretics).  Improve heart muscle strength and prevent the heart from beating too fast (beta blockers).  Increase the force of the heartbeat (digoxin).  Lower heart rates. Certain diabetes medicines (SGLT-2 inhibitors) may also be used in treatment. Healthy behavior changes Treatment may also include making healthy lifestyle changes, such as:  Reaching and staying at a healthy weight.  Not using tobacco or nicotine products.  Eating heart-healthy foods.  Limiting or avoiding alcohol.  Stopping the use of illegal drugs.  Being physically active.  Participating in a cardiac rehabilitation program, which is a treatment program to improve your health and well-being through exercise training, education, and counseling. Other treatments Other treatments may include:  Procedures to open blocked arteries or repair damaged valves.  Placing a pacemaker to improve heart function (cardiac resynchronization therapy).  Placing a device to treat serious abnormal heart rhythms (implantable cardioverter defibrillator, or ICD).  Placing a device to improve the pumping ability of the heart (left ventricular assist device, or LVAD).  Receiving a healthy heart from a donor (heart transplant). This is done when other treatments have not helped. Follow these instructions at home:  Manage other health conditions as told by your health care provider. These may include hypertension, diabetes, thyroid disease, or abnormal heart rhythms.  Get ongoing education and support as needed. Learn as much as you can about heart failure.  Keep all follow-up visits. This is important. Summary  Heart failure is a condition in which the heart has trouble pumping blood.  This condition is commonly caused by high blood pressure and other diseases of the heart and lungs.  Symptoms of this condition include shortness of breath, tiredness (fatigue), nausea, and swelling of the feet, ankles,  legs, or abdomen.  Treatments for this condition may include medicines, lifestyle changes, and surgery.  Manage other health conditions as told by your health care provider. This information is not intended to replace advice given to you by your health care provider. Make sure you discuss any questions you have with your health care provider. Document Revised: 02/27/2020 Document Reviewed: 02/27/2020 Elsevier Patient Education  [place].

## 2020-10-18 NOTE — Progress Notes (Signed)
Establish care

## 2020-10-19 ENCOUNTER — Telehealth: Payer: Self-pay | Admitting: Family

## 2020-10-19 ENCOUNTER — Telehealth (HOSPITAL_COMMUNITY): Payer: Self-pay | Admitting: Pharmacy Technician

## 2020-10-19 NOTE — Telephone Encounter (Signed)
Sent in [employer] and [employer] applications via fax.

## 2020-10-19 NOTE — Telephone Encounter (Signed)
Pt Works at [employer] and requires a note from PCP stating Lifting restrictions. Pt will be picking up note at 5pm, 10/19/20. Please advise and thank you

## 2020-10-21 NOTE — Progress Notes (Signed)
Advanced Heart Failure Clinic Note   Referring Physician: Dr. Renne Crigler  Primary Care: Dr. Ronnald Ramp  HF Cardiologist: Dr. Aundra Dubin HPI: [name] is a 45 year old with history of HTN, hyperlipidemia, DMII, snores, and chronic combined systolic/diastolic heart failure.   Of note she ran out of medical insurance back in October and has been unable to get her medications.   Admitted 10/04/20 with hypertensive urgency and shortness of breath. HS trop 19>20>22. BNP was 118. She had been out meds since October. Started back on coreg and irbesartan.  She was discharged the next day on lasix as needed.    Today she returns for HF follow up. She was seen in the [facility] 2 weeks ago and doing well; [name] and [name] started. Overall feeling fine. Denies increasing SOB, CP, dizziness, edema, or PND/Orthopnea. Appetite ok. No fever or chills. Weight at home 220-222 pounds. Taking all medications. Dances 3 days week. Snores a lot. Works full time nights at [employer]. Lives with her 2 sons. Bp at home 140-150/100-120. Has not needed prn lasix.  Cardiac Testing  Echo (2/22): EF 40-45% RV normal grade II DD Echo (2013): EF 55-60%  ROS: All systems reviewed and negative except as per HPI.   Past Medical History:  Diagnosis Date   CHF (congestive heart failure) (HCC)    Diabetes mellitus    HISTORY OF GESTATIONAL DIABETES   Hypertension    IUD    MIRENA INSERTED 01/24/11    Current Outpatient Medications  Medication Sig Dispense Refill   atorvastatin (LIPITOR) 20 MG tablet Take 1 tablet (20 mg total) by mouth daily. 30 tablet 0   carvedilol (COREG) 12.5 MG tablet Take 1 tablet (12.5 mg total) by mouth 2 (two) times daily with a meal. 60 tablet 0   dapagliflozin propanediol (FARXIGA) 10 MG TABS tablet Take 1 tablet (10 mg total) by mouth daily before breakfast. 30 tablet 2   levonorgestrel (MIRENA) 20 MCG/24HR IUD 1 Intra Uterine Device (1 each total) by Intrauterine route once. 1 each  0   metFORMIN (GLUCOPHAGE-XR) 500 MG 24 hr tablet Take 1,500 mg by mouth every morning.     sacubitril-valsartan (ENTRESTO) 97-103 MG Take 1 tablet by mouth 2 (two) times daily. 60 tablet 11   spironolactone (ALDACTONE) 25 MG tablet Take 1 tablet (25 mg total) by mouth at bedtime. 90 tablet 3   No current facility-administered medications for this encounter.    No Known Allergies    Social History   Socioeconomic History   Marital status: Single    Spouse name: Not on file   Number of children: 2   Years of education: Not on file   Highest education level: Not on file  Occupational History    Employer: [employer]  Tobacco Use   Smoking status: Never Smoker   Smokeless tobacco: Never Used  Vaping Use   Vaping Use: Never used  Substance and Sexual Activity   Alcohol use: Yes   Drug use: No   Sexual activity: Yes    Birth control/protection: I.U.D.    Comment: Mirena IUD inserted 2012?,intercourse age 78, more than 5 sexual partners  Other Topics Concern   Not on file  Social History Narrative   Not on file   Social Determinants of Health   Financial Resource Strain: Medium Risk   Difficulty of Paying Living Expenses: Somewhat hard  Food Insecurity: No Food Insecurity   Worried About Running Out of Food in the Last Year:  Never true   Ran Out of Food in the Last Year: Never true  Transportation Needs: No Transportation Needs   Lack of Transportation (Medical): No   Lack of Transportation (Non-Medical): No  Physical Activity: Sufficiently Active   Days of Exercise per Week: 3 days   Minutes of Exercise per Session: 120 min  Stress: Not on file  Social Connections: Unknown   Frequency of Communication with Friends and Family: Three times a week   Frequency of Social Gatherings with Friends and Family: Not on file   Attends Religious Services: Not on file   Active Member of Clubs or Organizations: Not on file   Attends Archivist  Meetings: 1 to 4 times per year   Marital Status: Not on file  Intimate Partner Violence: Not on file     Family History  Problem Relation Age of Onset   Diabetes Mother    Heart disease Mother    Hypertension Maternal Aunt    Breast cancer Neg Hx     Vitals:   10/24/20 1039  BP: (!) 180/130  Pulse: 89  SpO2: 97%  Weight: 102.3 kg   Wt Readings from Last 3 Encounters:  10/24/20 102.3 kg  10/18/20 101.6 kg  10/10/20 102.6 kg   PHYSICAL EXAM: General:  Well appearing. No respiratory difficulty HEENT: Normal Neck: Supple. no JVD. Carotids 2+ bilat; no bruits. No lymphadenopathy or thryomegaly appreciated. Cor: PMI nondisplaced. Regular rate & rhythm. No rubs, gallops or murmurs. Lungs: Clear Abdomen: Obese, soft, nontender, nondistended. No hepatosplenomegaly. No bruits or masses. Good bowel sounds. Extremities: No cyanosis, clubbing, rash, edema Neuro: Alert & oriented x 3, cranial nerves grossly intact. moves all 4 extremities w/o difficulty. Affect pleasant.  ASSESSMENT & PLAN: 1. Chronic Combined Systolic/Diastolic Heart Failure  - Echo 09/2020 EF 40-45% RV normal  - NYHA I-II. Volume status good today. - Continue Lasix 20 mg prn. - Continue carvedilol 12.5 mg bid.  - Continue Farxiga 10 mg daily. - Increase Enstresto 97/103 mg bid. - Start spiro 25 mg daily. - BMET today, repeat 1 week.  2. DMII - Most recent Hgb A1C 7.2.  - On metformin + Farxiga. - Per PCP.  3. HTN - Uncontrolled. Goal SBP <130 DBP < 80. - She will check daily BPs and bring log to next visit. - Increase Entresto and add spiro as above. - Could consider increasing beta blocker next, or adding BiDil.  4. Snoring - Suspected sleep apnea.  - Needs sleep study but will need to wait until she has insurance.   5. Obesity - Body mass index is 38.85 kg/m. - Encouraged portion control, low-carb food choices, and continued physical activity. - She line dances 3 days a week for 3 hours at  a time.  Follow up next week for RN visit for BP check, then 3 weeks w/ PharmD for further medication titration and in 6 weeks with APP.  Maricela Bo [facility] FNP-BC  10/24/20

## 2020-10-24 ENCOUNTER — Ambulatory Visit (HOSPITAL_COMMUNITY)
Admission: RE | Admit: 2020-10-24 | Discharge: 2020-10-24 | Disposition: A | Discharge status: Home / Self Care | Payer: Self-pay | Source: Ambulatory Visit | Attending: Family Medicine | Admitting: Family Medicine

## 2020-10-24 ENCOUNTER — Other Ambulatory Visit (HOSPITAL_COMMUNITY): Payer: Self-pay

## 2020-10-24 ENCOUNTER — Other Ambulatory Visit (HOSPITAL_COMMUNITY): Payer: Self-pay | Admitting: Family Medicine

## 2020-10-24 ENCOUNTER — Other Ambulatory Visit: Payer: Self-pay

## 2020-10-24 ENCOUNTER — Encounter (HOSPITAL_COMMUNITY): Payer: Self-pay

## 2020-10-24 VITALS — BP 180/130 | HR 89 | Wt 225.6 lb

## 2020-10-24 DIAGNOSIS — I110 Hypertensive heart disease with heart failure: Secondary | ICD-10-CM | POA: Insufficient documentation

## 2020-10-24 DIAGNOSIS — E669 Obesity, unspecified: Secondary | ICD-10-CM | POA: Insufficient documentation

## 2020-10-24 DIAGNOSIS — Z7984 Long term (current) use of oral hypoglycemic drugs: Secondary | ICD-10-CM | POA: Insufficient documentation

## 2020-10-24 DIAGNOSIS — Z793 Long term (current) use of hormonal contraceptives: Secondary | ICD-10-CM | POA: Insufficient documentation

## 2020-10-24 DIAGNOSIS — R0683 Snoring: Secondary | ICD-10-CM | POA: Insufficient documentation

## 2020-10-24 DIAGNOSIS — I5042 Chronic combined systolic (congestive) and diastolic (congestive) heart failure: Secondary | ICD-10-CM | POA: Insufficient documentation

## 2020-10-24 DIAGNOSIS — I5022 Chronic systolic (congestive) heart failure: Secondary | ICD-10-CM

## 2020-10-24 DIAGNOSIS — Z6838 Body mass index (BMI) 38.0-38.9, adult: Secondary | ICD-10-CM | POA: Insufficient documentation

## 2020-10-24 DIAGNOSIS — E785 Hyperlipidemia, unspecified: Secondary | ICD-10-CM | POA: Insufficient documentation

## 2020-10-24 DIAGNOSIS — I10 Essential (primary) hypertension: Secondary | ICD-10-CM

## 2020-10-24 DIAGNOSIS — Z7182 Exercise counseling: Secondary | ICD-10-CM | POA: Insufficient documentation

## 2020-10-24 DIAGNOSIS — E119 Type 2 diabetes mellitus without complications: Secondary | ICD-10-CM | POA: Insufficient documentation

## 2020-10-24 DIAGNOSIS — Z79899 Other long term (current) drug therapy: Secondary | ICD-10-CM | POA: Insufficient documentation

## 2020-10-24 LAB — BASIC METABOLIC PANEL
Anion gap: 10 (ref 5–15)
BUN: 13 mg/dL (ref 6–20)
CO2: 24 mmol/L (ref 22–32)
Calcium: 9.4 mg/dL (ref 8.9–10.3)
Chloride: 104 mmol/L (ref 98–111)
Creatinine, Ser: 1.12 mg/dL — ABNORMAL HIGH (ref 0.44–1.00)
GFR, Estimated: >60 mL/min (ref >60)
Glucose, Bld: 110 mg/dL — ABNORMAL HIGH (ref 70–99)
Potassium: 3.7 mmol/L (ref 3.5–5.1)
Sodium: 138 mmol/L (ref 135–145)

## 2020-10-24 MED ORDER — SPIRONOLACTONE 25 MG PO TABS: 25.0000 mg | ORAL_TABLET | Freq: Every day | ORAL | 3 refills | Status: DC

## 2020-10-24 MED ORDER — ENTRESTO 97-103 MG PO TABS: 1.0000 | ORAL_TABLET | Freq: Two times a day (BID) | ORAL | 11 refills | Status: DC

## 2020-10-24 MED ORDER — SPIRONOLACTONE 25 MG PO TABS
12.5000 mg | ORAL_TABLET | Freq: Every day | ORAL | 3 refills | Status: DC
Start: 2020-10-24 — End: 2020-10-24

## 2020-10-24 NOTE — Patient Instructions (Addendum)
Labs done today. We will contact you only if your labs are abnormal.  INCREASE Entresto to 97-103mg  (1 tablet) by mouth 2 times daily.  START Spironolactone 25mg  (1 tablet) by mouth daily at bedtime.  No other medication changes were made. Please continue all current medications as prescribed.  Your physician recommends that you schedule a follow-up appointment in: 7-10 days for a lab only appointment, in 3-4 weeks with Pharmacy Clinic and in 6 weeks with here with our APP Clinic.   If you have any questions or concerns before your next appointment please send [place] a message through [place] or call our office at [phone].    TO LEAVE A MESSAGE FOR THE NURSE SELECT OPTION 2, PLEASE LEAVE A MESSAGE INCLUDING: . YOUR NAME . DATE OF BIRTH . CALL BACK NUMBER . REASON FOR CALL**this is important as we prioritize the call backs  YOU WILL RECEIVE A CALL BACK THE SAME DAY AS LONG AS YOU CALL BEFORE 4:00 PM   Do the following things EVERYDAY: 1) Weigh yourself in the morning before breakfast. Write it down and keep it in a log. 2) Take your medicines as prescribed 3) Eat low salt foods--Limit salt (sodium) to 2000 mg per day.  4) Stay as active as you can everyday 5) Limit all fluids for the day to less than 2 liters   At the [facility], you and your health needs are our priority. As part of our continuing mission to provide you with exceptional heart care, we have created designated Provider Care Teams. These Care Teams include your primary Cardiologist (physician) and Advanced Practice Providers (APPs- Physician Assistants and Nurse Practitioners) who all work together to provide you with the care you need, when you need it.   You may see any of the following providers on your designated Care Team at your next follow up: Marland Kitchen Dr Glori Bickers . Dr Loralie Champagne . Darrick Grinder, NP . Lyda Jester, [place], FNP . Audry Riles, PharmD   Please be sure  to bring in all your medications bottles to every appointment.

## 2020-10-25 MED FILL — SPIRONOLACTONE 25 MG TABS: 25 | 30 days supply | Qty: 30 | Fill #0

## 2020-10-25 NOTE — Telephone Encounter (Signed)
Advanced Heart Failure Patient Advocate Encounter   Patient was approved to receive Farxiga from [employer]  Patient ID: [id number] Effective dates: 10/20/20 through 10/19/21  The patient called in had a question about her assistance, left a message with triage. We are still waiting to hear about [employer] assistance. Called the patient to see if she needed samples while we wait. Left vm.

## 2020-10-31 NOTE — Telephone Encounter (Signed)
Called [employer] to check the status of the patient's application. Representative stated that they needed her insurance information. I confirmed that the patient does not currently have insurance at this time.   The representative sent the case to be reviewed again.

## 2020-11-03 ENCOUNTER — Other Ambulatory Visit: Payer: Self-pay

## 2020-11-03 ENCOUNTER — Other Ambulatory Visit (HOSPITAL_COMMUNITY): Payer: Self-pay | Admitting: Family Medicine

## 2020-11-03 ENCOUNTER — Ambulatory Visit (HOSPITAL_COMMUNITY)
Admission: RE | Admit: 2020-11-03 | Discharge: 2020-11-03 | Disposition: A | Discharge status: Home / Self Care | Payer: Medicaid Other | Source: Ambulatory Visit | Attending: Internal Medicine | Admitting: Internal Medicine

## 2020-11-03 DIAGNOSIS — E785 Hyperlipidemia, unspecified: Secondary | ICD-10-CM

## 2020-11-03 DIAGNOSIS — I10 Essential (primary) hypertension: Secondary | ICD-10-CM | POA: Insufficient documentation

## 2020-11-03 LAB — BASIC METABOLIC PANEL
Anion gap: 8 (ref 5–15)
BUN: 18 mg/dL (ref 6–20)
CO2: 30 mmol/L (ref 22–32)
Calcium: 9.7 mg/dL (ref 8.9–10.3)
Chloride: 101 mmol/L (ref 98–111)
Creatinine, Ser: 1.09 mg/dL — ABNORMAL HIGH (ref 0.44–1.00)
GFR, Estimated: >60 mL/min (ref >60)
Glucose, Bld: 171 mg/dL — ABNORMAL HIGH (ref 70–99)
Potassium: 3.9 mmol/L (ref 3.5–5.1)
Sodium: 139 mmol/L (ref 135–145)

## 2020-11-03 MED ORDER — METFORMIN HCL ER 500 MG PO TB24: 1500.0000 mg | ORAL_TABLET | Freq: Every morning | ORAL | 0 refills | Status: DC

## 2020-11-03 MED ORDER — ATORVASTATIN CALCIUM 20 MG PO TABS: 20.0000 mg | ORAL_TABLET | Freq: Every day | ORAL | 3 refills | Status: DC

## 2020-11-03 NOTE — Addendum Note (Signed)
Encounter addended by: Harvie Junior, CMA on: 11/03/2020 10:25 AM  Actions taken: Vitals modified, Pharmacy for encounter modified, Order list changed, Diagnosis association updated

## 2020-11-07 ENCOUNTER — Other Ambulatory Visit (HOSPITAL_COMMUNITY): Payer: Self-pay | Admitting: *Deleted

## 2020-11-07 ENCOUNTER — Other Ambulatory Visit (HOSPITAL_COMMUNITY): Payer: Self-pay | Admitting: Adult Health

## 2020-11-07 DIAGNOSIS — I10 Essential (primary) hypertension: Secondary | ICD-10-CM

## 2020-11-07 DIAGNOSIS — E785 Hyperlipidemia, unspecified: Secondary | ICD-10-CM

## 2020-11-07 MED ORDER — CARVEDILOL 12.5 MG PO TABS: 12.5000 mg | ORAL_TABLET | Freq: Two times a day (BID) | ORAL | 3 refills | Status: DC

## 2020-11-07 MED ORDER — ATORVASTATIN CALCIUM 20 MG PO TABS: 20.0000 mg | ORAL_TABLET | Freq: Every day | ORAL | 3 refills | Status: DC

## 2020-11-07 MED ORDER — DAPAGLIFLOZIN PROPANEDIOL 10 MG PO TABS: 10.0000 mg | ORAL_TABLET | Freq: Every day | ORAL | 2 refills | Status: DC

## 2020-11-07 MED FILL — CARVEDILOL 12.5 MG TABLET: 12.5 | 30 days supply | Qty: 60 | Fill #0

## 2020-11-07 MED FILL — ATORVASTATIN CALCIUM 20 MG: 20 | 30 days supply | Qty: 30 | Fill #0

## 2020-11-07 MED FILL — FARXIGA 10 MG TABLET: 10 | 30 days supply | Qty: 30 | Fill #0

## 2020-11-14 ENCOUNTER — Telehealth (HOSPITAL_COMMUNITY): Payer: Self-pay | Admitting: Pharmacy Technician

## 2020-11-14 ENCOUNTER — Other Ambulatory Visit (HOSPITAL_COMMUNITY): Payer: Self-pay | Admitting: *Deleted

## 2020-11-14 MED ORDER — ENTRESTO 97-103 MG PO TABS: 1.0000 | ORAL_TABLET | Freq: Two times a day (BID) | ORAL | 3 refills | Status: DC

## 2020-11-14 NOTE — Telephone Encounter (Signed)
Advanced Heart Failure Patient Advocate Encounter   Patient was approved to receive Entresto from [employer]  Patient ID: [id number] Effective dates: 11/08/20 through 11/08/21  Requested that the new RX be sent to [employer]. Called and spoke with the patient. She already spoke to [employer] and is going to call me tomorrow when the shipment arrives. I will go over dosing instructions again with her at that time.  Charlann Boxer, CPhT

## 2020-11-22 ENCOUNTER — Inpatient Hospital Stay (HOSPITAL_COMMUNITY): Admission: RE | Admit: 2020-11-22 | Payer: Medicaid Other | Source: Ambulatory Visit

## 2020-11-23 ENCOUNTER — Ambulatory Visit (INDEPENDENT_AMBULATORY_CARE_PROVIDER_SITE_OTHER): Payer: Self-pay | Admitting: Family

## 2020-11-23 ENCOUNTER — Encounter: Payer: Self-pay | Admitting: Family

## 2020-11-23 ENCOUNTER — Other Ambulatory Visit: Payer: Self-pay

## 2020-11-23 VITALS — BP 151/92 | HR 82 | Ht 63.9 in | Wt 222.8 lb

## 2020-11-23 DIAGNOSIS — D509 Iron deficiency anemia, unspecified: Secondary | ICD-10-CM

## 2020-11-23 DIAGNOSIS — Z0100 Encounter for examination of eyes and vision without abnormal findings: Secondary | ICD-10-CM

## 2020-11-23 DIAGNOSIS — Z1231 Encounter for screening mammogram for malignant neoplasm of breast: Secondary | ICD-10-CM

## 2020-11-23 DIAGNOSIS — Z0000 Encounter for general adult medical examination without abnormal findings: Secondary | ICD-10-CM

## 2020-11-23 DIAGNOSIS — Z13228 Encounter for screening for other metabolic disorders: Secondary | ICD-10-CM

## 2020-11-23 DIAGNOSIS — Z1211 Encounter for screening for malignant neoplasm of colon: Secondary | ICD-10-CM

## 2020-11-23 DIAGNOSIS — Z130 Encounter for screening for diseases of the blood and blood-forming organs and certain disorders involving the immune mechanism: Secondary | ICD-10-CM

## 2020-11-23 DIAGNOSIS — Z1329 Encounter for screening for other suspected endocrine disorder: Secondary | ICD-10-CM

## 2020-11-23 DIAGNOSIS — I10 Essential (primary) hypertension: Secondary | ICD-10-CM

## 2020-11-23 DIAGNOSIS — Z124 Encounter for screening for malignant neoplasm of cervix: Secondary | ICD-10-CM

## 2020-11-23 DIAGNOSIS — E785 Hyperlipidemia, unspecified: Secondary | ICD-10-CM

## 2020-11-23 DIAGNOSIS — E119 Type 2 diabetes mellitus without complications: Secondary | ICD-10-CM

## 2020-11-23 NOTE — Progress Notes (Signed)
Patient ID: [name], female    DOB: 09-26-1975  MRN: [id number]  CC: Annual Physical Exam  Subjective: [name] is a 45 y.o. female who presents for annual physical exam.  Her concerns today include:   Visit 10/24/2020 at [facility] and [facility] per NP note: 1. Chronic Combined Systolic/Diastolic Heart Failure  - Echo 09/2020 EF 40-45% RV normal  - NYHA I-II. Volume status good today. - Continue Lasix 20 mg prn. - Continue carvedilol 12.5 mg bid.  - Continue Farxiga 10 mg daily. - Increase Enstresto 97/103 mg bid. - Start spiro 25 mg daily. - BMET today, repeat 1 week.  2. DMII - Most recent Hgb A1C 7.2.  - On metformin + Farxiga. - Per PCP.  3. HTN - Uncontrolled. Goal SBP <130 DBP < 80. - She will check daily BPs and bring log to next visit. - Increase Entresto and add spiro as above. - Could consider increasing beta blocker next, or adding BiDil.  4. Snoring - Suspected sleep apnea.  - Needs sleep study but will need to wait until she has insurance.   5. Obesity - Body mass index is 38.85 kg/m. - Encouraged portion control, low-carb food choices, and continued physical activity. - She line dances 3 days a week for 3 hours at a time.  INCREASE Entresto to 97-103mg  (1 tablet) by mouth 2 times daily.  START Spironolactone 25mg  (1 tablet) by mouth daily at bedtime.  Follow up next week for RN visit for BP check, then 3 weeks w/ PharmD for further medication titration and in 6 weeks with APP.  11/23/2020: DIABETES TYPE 2 FOLLOW-UP: Last A1C:  7.2% on 10/06/2020 Med Adherence:  [x]  Yes    []  No Medication side effects:  []  Yes    [x]  No Home Monitoring?  []  Yes    [x]  No Diet Adherence: has improved Exercise: dancing  Last eye exam: 2 years ago  Comments: Her blood pressure is elevated. Did take a blood pressure pill prior to today's appointment.  Patient Active Problem List   Diagnosis Date Noted  .  Exertional dyspnea 10/04/2020  . Hypertensive urgency 10/04/2020  . Left lumbar radiculitis 03/23/2019  . Vitamin D deficiency disease 06/04/2018  . Routine general medical examination at a health care facility 05/15/2017  . Cervical cancer screening 05/15/2017  . Dyslipidemia, goal LDL below 100 04/04/2017  . Snoring 10/25/2016  . Obesity (BMI 30-39.9) 03/09/2014  . Diuretic-induced hypokalemia 02/13/2013  . Diabetes mellitus without complication ([place]) 56/43/3295  . 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 atorvastatin (LIPITOR) 20 MG tablet TAKE 1 TABLET (20 MG TOTAL) BY MOUTH DAILY. 30 tablet 3  . carvedilol (COREG) 12.5 MG tablet TAKE 1 TABLET (12.5 MG TOTAL) BY MOUTH 2 (TWO) TIMES DAILY WITH A MEAL. 60 tablet 3  . dapagliflozin propanediol (FARXIGA) 10 MG TABS tablet TAKE 1 TABLET (10 MG TOTAL) BY MOUTH DAILY BEFORE BREAKFAST. 30 tablet 2  . furosemide (LASIX) 20 MG tablet TAKE 1 TABLET BY MOUTH DAILY AS NEEDED 30 tablet 2  . irbesartan (AVAPRO) 150 MG tablet TAKE 1 TABLET (150 MG TOTAL) BY MOUTH DAILY. 30 tablet 0  . levonorgestrel (MIRENA) 20 MCG/24HR IUD 1 Intra Uterine Device (1 each total) by Intrauterine route once. 1 each 0  . metFORMIN (GLUCOPHAGE-XR) 500 MG 24 hr tablet TAKE 3 TABLETS (1,500 MG TOTAL) BY  MOUTH EVERY MORNING. 90 tablet 0  . sacubitril-valsartan (ENTRESTO) 49-51 MG Take 1 tablet by mouth 2 (two) times daily.    Marland Kitchen spironolactone (ALDACTONE) 25 MG tablet TAKE 1 TABLET (25 MG TOTAL) BY MOUTH AT BEDTIME. 90 tablet 3  . sacubitril-valsartan (ENTRESTO) 97-103 MG TAKE 1 TABLET BY MOUTH TWICE DAILY. (Patient not taking: Reported on 11/23/2020) 60 tablet 11   No current facility-administered medications on file prior to visit.    No Known Allergies  Social History   Socioeconomic History  . Marital status: Single    Spouse name: Not on file  . Number of children: 2  . Years of  education: Not on file  . Highest education level: Not on file  Occupational History    Employer: [employer]  Tobacco Use  . Smoking status: Never Smoker  . Smokeless tobacco: Never Used  Vaping Use  . Vaping Use: Never used  Substance and Sexual Activity  . Alcohol use: Yes  . Drug use: No  . Sexual activity: Yes    Birth control/protection: I.U.D.    Comment: Mirena IUD inserted 2012?,intercourse age 84, more than 5 sexual partners  Other Topics Concern  . Not on file  Social History Narrative  . Not on file   Social Determinants of Health   Financial Resource Strain: Medium Risk  . Difficulty of Paying Living Expenses: Somewhat hard  Food Insecurity: No Food Insecurity  . Worried About Charity fundraiser in the Last Year: Never true  . Ran Out of Food in the Last Year: Never true  Transportation Needs: No Transportation Needs  . Lack of Transportation (Medical): No  . Lack of Transportation (Non-Medical): No  Physical Activity: Sufficiently Active  . Days of Exercise per Week: 3 days  . Minutes of Exercise per Session: 120 min  Stress: Not on file  Social Connections: Unknown  . Frequency of Communication with Friends and Family: Three times a week  . Frequency of Social Gatherings with Friends and Family: Not on file  . Attends Religious Services: Not on file  . Active Member of Clubs or Organizations: Not on file  . Attends Archivist Meetings: 1 to 4 times per year  . Marital Status: Not on file  Intimate Partner Violence: Not on file    Family History  Problem Relation Age of Onset  . Diabetes Mother   . Heart disease Mother   . Hypertension Maternal Aunt   . Breast cancer Neg Hx     Past Surgical History:  Procedure Laterality Date  . CESAREAN SECTION  2006    ROS: Review of Systems Negative except as stated above  PHYSICAL EXAM: BP (!) 151/92 (BP Location: Left Arm, Patient Position: Sitting)   Pulse 82   Ht 5' 3.9" (1.623 m)   Wt 222 lb  12.8 oz (101.1 kg)   SpO2 97%   BMI 38.37 kg/m   Physical Exam Exam conducted with a chaperone present.  HENT:     Head: Normocephalic and atraumatic.     Right Ear: Tympanic membrane, ear canal and external ear normal.     Left Ear: Tympanic membrane, ear canal and external ear normal.     Nose: Nose normal.     Mouth/Throat:     Mouth: Mucous membranes are moist.     Pharynx: Oropharynx is clear.  Eyes:     Conjunctiva/sclera: Conjunctivae normal.     Pupils: Pupils are equal, round, and reactive to light.  Cardiovascular:     Rate and Rhythm: Normal rate and regular rhythm.     Pulses: Normal pulses.     Heart sounds: Normal heart sounds.  Pulmonary:     Effort: Pulmonary effort is normal.     Breath sounds: Normal breath sounds.  Chest:  Breasts:     Right: Normal.     Left: Normal.      Comments: Elmon Else, CMA present during examination.  Abdominal:     General: Bowel sounds are normal.     Palpations: Abdomen is soft.  Genitourinary:    Comments: Patient declined examination. Musculoskeletal:        General: Normal range of motion.     Cervical back: Normal range of motion and neck supple.  Skin:    General: Skin is warm and dry.     Capillary Refill: Capillary refill takes less than 2 seconds.  Neurological:     General: No focal deficit present.     Mental Status: She is alert and oriented to person, place, and time.  Psychiatric:        Mood and Affect: Mood normal.        Behavior: Behavior normal.    ASSESSMENT AND PLAN: 1. Annual physical exam: - Counseled on 150 minutes of exercise per week as tolerated, healthy eating (including decreased daily intake of saturated fats, cholesterol, added sugars, sodium), STI prevention, and routine healthcare maintenance.  2. Screening for metabolic disorder: - BMP last obtained 11/03/2020. - Hepatic function panel to check liver function.  - Hepatic Function Panel  3. Screening for deficiency anemia: -  CBC to screen for anemia. - CBC  4. Thyroid disorder screen: - TSH last obtained 10/05/2020.  5. Type 2 diabetes mellitus without complication, without long-term current use of insulin ([place]): - Hemoglobin close to goal at 7.2% on 10/06/2020, goal < 7%. Next hemoglobin A1c due May 2022. - Continue Metformin and Farxiga as prescribed.  - Discussed the importance of healthy eating habits, low-carbohydrate diet, low-sugar diet, regular aerobic exercise (at least 150 minutes a week as tolerated) and medication compliance to achieve or maintain control of diabetes. - Follow-up with primary provider as scheduled.   6. Dyslipidemia, goal LDL below 100: - Practice low-fat heart healthy diet and at least 150 minutes of moderate intensity exercise weekly as tolerated.  - Continue Atorvastatin as prescribed.  - Lipid panel to screen for high cholesterol.  - Follow-up with primary provider as scheduled.  - Lipid panel  7. Essential hypertension: - Blood pressure not at goal during today's visit. Patient asymptomatic without chest pressure, chest pain, palpitations, shortness of breath, and worst headache of life. - Continue regimen per Cardiology.  - Keep appointments with Cardiology.   8. Breast cancer screening by mammogram: - Referral for breast cancer screening by mammogram.  - MM Digital Screening; Future  9. Pap smear for cervical cancer screening: - Referral to Obstetrics / Gynecology for cervical cancer screening by PAP smear.  - Ambulatory referral to Gynecology  10. Colon cancer screening: - Referral to Gastroenterology for colon cancer screening by colonoscopy. - Ambulatory referral to Gastroenterology  11. Diabetic eye exam [facility]): - Referral to Ophthalmology for diabetic eye examination.  - Ambulatory referral to Ophthalmology   Patient was given the opportunity to ask questions.  Patient verbalized understanding of the plan and was able to repeat key elements of the plan.  Patient was given clear instructions to go to Emergency Department or return to medical  center if symptoms don't improve, worsen, or new problems develop.The patient verbalized understanding.   Orders Placed This Encounter  Procedures  . MM Digital Screening  . Hepatic Function Panel  . CBC  . Lipid panel  . Ambulatory referral to Gynecology  . Ambulatory referral to Ophthalmology  . Ambulatory referral to Gastroenterology     Requested Prescriptions    No prescriptions requested or ordered in this encounter    Follow-up with primary provider as scheduled.   Camillia Herter, NP

## 2020-11-23 NOTE — Progress Notes (Signed)
Physical

## 2020-11-23 NOTE — Patient Instructions (Addendum)
Annual physical exam and labs today.   Return May 2022 for diabetes follow-up.  Keep appointments with Cardiology.   Referral to Ophthalmology for diabetic eye examination.   Referral for mammogram.  Referral to Gynecology for PAP smear.   Referral to Gastroenterology for colonoscopy.   Follow-up with primary provider as scheduled.  Preventive Care 83-47 Years Old, Female Preventive care refers to lifestyle choices and visits with your health care provider that can promote health and wellness. This includes:  A yearly physical exam. This is also called an annual wellness visit.  Regular dental and eye exams.  Immunizations.  Screening for certain conditions.  Healthy lifestyle choices, such as: ? Eating a healthy diet. ? Getting regular exercise. ? Not using drugs or products that contain nicotine and tobacco. ? Limiting alcohol use. What can I expect for my preventive care visit? Physical exam Your health care provider will check your:  Height and weight. These may be used to calculate your BMI (body mass index). BMI is a measurement that tells if you are at a healthy weight.  Heart rate and blood pressure.  Body temperature.  Skin for abnormal spots. Counseling Your health care provider may ask you questions about your:  Past medical problems.  Family's medical history.  Alcohol, tobacco, and drug use.  Emotional well-being.  Home life and relationship well-being.  Sexual activity.  Diet, exercise, and sleep habits.  Work and work Statistician.  Access to firearms.  Method of birth control.  Menstrual cycle.  Pregnancy history. What immunizations do I need? Vaccines are usually given at various ages, according to a schedule. Your health care provider will recommend vaccines for you based on your age, medical history, and lifestyle or other factors, such as travel or where you work.   What tests do I need? Blood tests  Lipid and cholesterol  levels. These may be checked every 5 years, or more often if you are over 61 years old.  Hepatitis C test.  Hepatitis B test. Screening  Lung cancer screening. You may have this screening every year starting at age 47 if you have a 30-pack-year history of smoking and currently smoke or have quit within the past 15 years.  Colorectal cancer screening. ? All adults should have this screening starting at age 47 and continuing until age 72. ? Your health care provider may recommend screening at age 47 if you are at increased risk. ? You will have tests every 1-10 years, depending on your results and the type of screening test.  Diabetes screening. ? This is done by checking your blood sugar (glucose) after you have not eaten for a while (fasting). ? You may have this done every 1-3 years.  Mammogram. ? This may be done every 1-2 years. ? Talk with your health care provider about when you should start having regular mammograms. This may depend on whether you have a family history of breast cancer.  BRCA-related cancer screening. This may be done if you have a family history of breast, ovarian, tubal, or peritoneal cancers.  Pelvic exam and Pap test. ? This may be done every 3 years starting at age 68. ? Starting at age 70, this may be done every 5 years if you have a Pap test in combination with an HPV test. Other tests  STD (sexually transmitted disease) testing, if you are at risk.  Bone density scan. This is done to screen for osteoporosis. You may have this scan if you are  at high risk for osteoporosis. Talk with your health care provider about your test results, treatment options, and if necessary, the need for more tests. Follow these instructions at home: Eating and drinking  Eat a diet that includes fresh fruits and vegetables, whole grains, lean protein, and low-fat dairy products.  Take vitamin and mineral supplements as recommended by your health care provider.  Do not  drink alcohol if: ? Your health care provider tells you not to drink. ? You are pregnant, may be pregnant, or are planning to become pregnant.  If you drink alcohol: ? Limit how much you have to 0-1 drink a day. ? Be aware of how much alcohol is in your drink. In the [place] one drink equals one 12 oz bottle of beer (355 mL), one 5 oz glass of wine (148 mL), or one 1 oz glass of hard liquor (44 mL).   Lifestyle  Take daily care of your teeth and gums. Brush your teeth every morning and night with fluoride toothpaste. Floss one time each day.  Stay active. Exercise for at least 30 minutes 5 or more days each week.  Do not use any products that contain nicotine or tobacco, such as cigarettes, e-cigarettes, and chewing tobacco. If you need help quitting, ask your health care provider.  Do not use drugs.  If you are sexually active, practice safe sex. Use a condom or other form of protection to prevent STIs (sexually transmitted infections).  If you do not wish to become pregnant, use a form of birth control. If you plan to become pregnant, see your health care provider for a prepregnancy visit.  If told by your health care provider, take low-dose aspirin daily starting at age 44.  Find healthy ways to cope with stress, such as: ? Meditation, yoga, or listening to music. ? Journaling. ? Talking to a trusted person. ? Spending time with friends and family. Safety  Always wear your seat belt while driving or riding in a vehicle.  Do not drive: ? If you have been drinking alcohol. Do not ride with someone who has been drinking. ? When you are tired or distracted. ? While texting.  Wear a helmet and other protective equipment during sports activities.  If you have firearms in your house, make sure you follow all gun safety procedures. What's next?  Visit your health care provider once a year for an annual wellness visit.  Ask your health care provider how often you should have your  eyes and teeth checked.  Stay up to date on all vaccines. This information is not intended to replace advice given to you by your health care provider. Make sure you discuss any questions you have with your health care provider. Document Revised: 05/10/2020 Document Reviewed: 04/17/2018 Elsevier Patient Education  2021 [employer].

## 2020-11-24 ENCOUNTER — Other Ambulatory Visit (HOSPITAL_COMMUNITY): Payer: Self-pay

## 2020-11-24 DIAGNOSIS — D509 Iron deficiency anemia, unspecified: Secondary | ICD-10-CM | POA: Insufficient documentation

## 2020-11-24 LAB — CBC
Hematocrit: 43.6 % (ref 34.0–46.6)
Hemoglobin: 13.5 g/dL (ref 11.1–15.9)
MCH: 23.6 pg — ABNORMAL LOW (ref 26.6–33.0)
MCHC: 31 g/dL — ABNORMAL LOW (ref 31.5–35.7)
MCV: 76 fL — ABNORMAL LOW (ref 79–97)
Platelets: 241 10*3/uL (ref 150–450)
RBC: 5.72 x10E6/uL — ABNORMAL HIGH (ref 3.77–5.28)
RDW: 17.9 % — ABNORMAL HIGH (ref 11.7–15.4)
WBC: 8.1 10*3/uL (ref 3.4–10.8)

## 2020-11-24 LAB — LIPID PANEL
Chol/HDL Ratio: 2.6 ratio (ref 0.0–4.4)
Cholesterol, Total: 184 mg/dL (ref 100–199)
HDL: 71 mg/dL (ref >39)
LDL Chol Calc (NIH): 102 mg/dL — ABNORMAL HIGH (ref 0–99)
Triglycerides: 57 mg/dL (ref 0–149)
VLDL Cholesterol Cal: 11 mg/dL (ref 5–40)

## 2020-11-24 LAB — HEPATIC FUNCTION PANEL
ALT: 19 IU/L (ref 0–32)
AST: 18 IU/L (ref 0–40)
Albumin: 4.3 g/dL (ref 3.8–4.8)
Alkaline Phosphatase: 95 IU/L (ref 44–121)
Bilirubin Total: 0.4 mg/dL (ref 0.0–1.2)
Bilirubin, Direct: 0.13 mg/dL (ref 0.00–0.40)
Total Protein: 7.5 g/dL (ref 6.0–8.5)

## 2020-11-24 MED ORDER — IRON (FERROUS SULFATE) 325 (65 FE) MG PO TABS
325.0000 mg | ORAL_TABLET | Freq: Every day | ORAL | 2 refills | Status: DC
  Filled 2020-11-24: qty 30, fill #0

## 2020-11-24 NOTE — Progress Notes (Signed)
Liver function normal.   An iron pill, Ferrous Sulfate, prescribed for iron deficiency. Encouraged to have anemia rechecked in 12 weeks.  LDL cholesterol, sometimes called "bad cholesterol", higher than expected. High cholesterol may increase risk of heart attack and/or stroke. Consider eating more fruits, vegetables, and lean baked meats such as chicken or fish. Moderate intensity exercise at least 150 minutes as tolerated per week may help as well. Continue Atorvastatin as prescribed.

## 2020-11-24 NOTE — Addendum Note (Signed)
Addended by: Camillia Herter on: 11/24/2020 09:53 AM   Modules accepted: Orders

## 2020-12-05 ENCOUNTER — Encounter (HOSPITAL_COMMUNITY): Payer: Self-pay

## 2020-12-05 ENCOUNTER — Other Ambulatory Visit: Payer: Self-pay

## 2020-12-05 ENCOUNTER — Ambulatory Visit (HOSPITAL_COMMUNITY)
Admission: RE | Admit: 2020-12-05 | Discharge: 2020-12-05 | Disposition: A | Discharge status: Home / Self Care | Payer: Self-pay | Source: Ambulatory Visit | Attending: Adult Health | Admitting: Adult Health

## 2020-12-05 VITALS — BP 160/110 | HR 94 | Wt 224.6 lb

## 2020-12-05 DIAGNOSIS — E119 Type 2 diabetes mellitus without complications: Secondary | ICD-10-CM | POA: Insufficient documentation

## 2020-12-05 DIAGNOSIS — R0683 Snoring: Secondary | ICD-10-CM

## 2020-12-05 DIAGNOSIS — I509 Heart failure, unspecified: Secondary | ICD-10-CM

## 2020-12-05 DIAGNOSIS — I5022 Chronic systolic (congestive) heart failure: Secondary | ICD-10-CM

## 2020-12-05 DIAGNOSIS — I10 Essential (primary) hypertension: Secondary | ICD-10-CM

## 2020-12-05 DIAGNOSIS — Z7984 Long term (current) use of oral hypoglycemic drugs: Secondary | ICD-10-CM | POA: Insufficient documentation

## 2020-12-05 DIAGNOSIS — I5042 Chronic combined systolic (congestive) and diastolic (congestive) heart failure: Secondary | ICD-10-CM | POA: Insufficient documentation

## 2020-12-05 DIAGNOSIS — Z79899 Other long term (current) drug therapy: Secondary | ICD-10-CM | POA: Insufficient documentation

## 2020-12-05 DIAGNOSIS — E669 Obesity, unspecified: Secondary | ICD-10-CM | POA: Insufficient documentation

## 2020-12-05 DIAGNOSIS — Z6838 Body mass index (BMI) 38.0-38.9, adult: Secondary | ICD-10-CM | POA: Insufficient documentation

## 2020-12-05 DIAGNOSIS — I110 Hypertensive heart disease with heart failure: Secondary | ICD-10-CM | POA: Insufficient documentation

## 2020-12-05 DIAGNOSIS — E785 Hyperlipidemia, unspecified: Secondary | ICD-10-CM | POA: Insufficient documentation

## 2020-12-05 DIAGNOSIS — Z8249 Family history of ischemic heart disease and other diseases of the circulatory system: Secondary | ICD-10-CM | POA: Insufficient documentation

## 2020-12-05 DIAGNOSIS — Z7901 Long term (current) use of anticoagulants: Secondary | ICD-10-CM | POA: Insufficient documentation

## 2020-12-05 MED ORDER — FUROSEMIDE 20 MG PO TABS: 20.0000 mg | ORAL_TABLET | ORAL | 2 refills | Status: DC

## 2020-12-05 MED ORDER — CARVEDILOL 12.5 MG PO TABS: 18.7500 mg | ORAL_TABLET | Freq: Two times a day (BID) | ORAL | 3 refills | Status: DC

## 2020-12-05 NOTE — Progress Notes (Signed)
Advanced Heart Failure Clinic Note   Primary Care: Dr. Ronnald Ramp  HF Cardiologist: Dr. Aundra Dubin HPI: [name] is a 45 year old with history of HTN, hyperlipidemia, DMII, snores, and chronic combined systolic/diastolic heart failure.   Of note she ran out of medical insurance back in October and has been unable to get her medications.   Admitted 10/04/20 with hypertensive urgency and shortness of breath. HS trop 19>20>22. BNP was 118. She had been out meds since October. Started back on coreg and irbesartan.  She was discharged the next day on lasix as needed.    Today she returns for HF follow up.Overall feeling fine. Denies SOB/PND/Orthopnea. Appetite ok. Says she needs to do better with type of food she is eating.  No fever or chills. Weight at home 221 pounds. Dancing 3x a day. Taking all medications and just took them right before the appointment. Works pat time at [employer]. Lives with her 2 sons.   Cardiac Testing  Echo (2/22): EF 40-45% RV normal grade II DD Echo (2013): EF 55-60%  ROS: All systems reviewed and negative except as per HPI.   Past Medical History:  Diagnosis Date  . CHF (congestive heart failure) ([place])   . Diabetes mellitus    HISTORY OF GESTATIONAL DIABETES  . Hypertension   . IUD    MIRENA INSERTED 01/24/11    Current Outpatient Medications  Medication Sig Dispense Refill  . atorvastatin (LIPITOR) 20 MG tablet TAKE 1 TABLET (20 MG TOTAL) BY MOUTH DAILY. 30 tablet 3  . carvedilol (COREG) 12.5 MG tablet TAKE 1 TABLET (12.5 MG TOTAL) BY MOUTH 2 (TWO) TIMES DAILY WITH A MEAL. 60 tablet 3  . furosemide (LASIX) 20 MG tablet TAKE 1 TABLET BY MOUTH DAILY AS NEEDED (Patient taking differently: Take 40 mg by mouth daily as needed.) 30 tablet 2  . levonorgestrel (MIRENA) 20 MCG/24HR IUD 1 Intra Uterine Device (1 each total) by Intrauterine route once. 1 each 0  . metFORMIN (GLUCOPHAGE-XR) 500 MG 24 hr tablet TAKE 3 TABLETS (1,500 MG TOTAL) BY MOUTH EVERY MORNING. 90 tablet  0  . sacubitril-valsartan (ENTRESTO) 97-103 MG TAKE 1 TABLET BY MOUTH TWICE DAILY. 60 tablet 11  . spironolactone (ALDACTONE) 25 MG tablet TAKE 1 TABLET (25 MG TOTAL) BY MOUTH AT BEDTIME. 90 tablet 3  . dapagliflozin propanediol (FARXIGA) 10 MG TABS tablet TAKE 1 TABLET (10 MG TOTAL) BY MOUTH DAILY BEFORE BREAKFAST. 30 tablet 2  . Iron, Ferrous Sulfate, 325 (65 Fe) MG TABS Take 325 mg by mouth daily. (Patient not taking: Reported on 12/05/2020) 30 tablet 2   No current facility-administered medications for this encounter.    No Known Allergies    Social History   Socioeconomic History  . Marital status: Single    Spouse name: Not on file  . Number of children: 2  . Years of education: Not on file  . Highest education level: Not on file  Occupational History    Employer: [employer]  Tobacco Use  . Smoking status: Never Smoker  . Smokeless tobacco: Never Used  Vaping Use  . Vaping Use: Never used  Substance and Sexual Activity  . Alcohol use: Yes  . Drug use: No  . Sexual activity: Yes    Birth control/protection: I.U.D.    Comment: Mirena IUD inserted 2012?,intercourse age 54, more than 5 sexual partners  Other Topics Concern  . Not on file  Social History Narrative  . Not on file   Social Determinants  of Health   Financial Resource Strain: Medium Risk  . Difficulty of Paying Living Expenses: Somewhat hard  Food Insecurity: No Food Insecurity  . Worried About Charity fundraiser in the Last Year: Never true  . Ran Out of Food in the Last Year: Never true  Transportation Needs: No Transportation Needs  . Lack of Transportation (Medical): No  . Lack of Transportation (Non-Medical): No  Physical Activity: Sufficiently Active  . Days of Exercise per Week: 3 days  . Minutes of Exercise per Session: 120 min  Stress: Not on file  Social Connections: Unknown  . Frequency of Communication with Friends and Family: Three times a week  . Frequency of Social Gatherings with Friends  and Family: Not on file  . Attends Religious Services: Not on file  . Active Member of Clubs or Organizations: Not on file  . Attends Archivist Meetings: 1 to 4 times per year  . Marital Status: Not on file  Intimate Partner Violence: Not on file     Family History  Problem Relation Age of Onset  . Diabetes Mother   . Heart disease Mother   . Hypertension Maternal Aunt   . Breast cancer Neg Hx     Vitals:   12/05/20 1010  BP: (!) 160/110  Pulse: 94  SpO2: 96%  Weight: 101.9 kg (224 lb 9.6 oz)   Wt Readings from Last 3 Encounters:  12/05/20 101.9 kg  11/23/20 101.1 kg  10/24/20 102.3 kg   PHYSICAL EXAM: General:  Well appearing. No resp difficulty HEENT: normal Neck: supple. no JVD. Carotids 2+ bilat; no bruits. No lymphadenopathy or thryomegaly appreciated. Cor: PMI nondisplaced. Regular rate & rhythm. No rubs, gallops or murmurs. Lungs: clear Abdomen: soft, nontender, nondistended. No hepatosplenomegaly. No bruits or masses. Good bowel sounds. Extremities: no cyanosis, clubbing, rash, edema Neuro: alert & orientedx3, cranial nerves grossly intact. moves all 4 extremities w/o difficulty. Affect pleasant  ASSESSMENT & PLAN: 1. Chronic Combined Systolic/Diastolic Heart Failure  - Echo 09/2020 EF 40-45% RV normal  - NYHA II. Start lasix 20 mg M-W F - Increase carvedilol  18.75 mg twice a day.   - Continue Farxiga 10 mg daily. - Continue Enstresto 97/103 mg bid. - Continue spiro 25 mg daily.  2. DMII - Most recent Hgb A1C 7.2.  - On metformin + Farxiga. - Per PCP.  3. HTN - Uncontrolled. Goal SBP <130 DBP < 80. - Increase coreg as above - Given BP monitor today and she was instructed to record BP daily. - Will also need sleep study when able.   4. Snoring - Suspected sleep apnea.  . - Eventually will need sleep study.   5. Obesity - Body mass index is 38.68 kg/m.  - Discussed portation control.   Refilled HF meds today. Discussed medication  changes. Referred to SW for assistance with insurance.   Follow up 3 months.    Mykale Gandolfo NP-C  12/05/20

## 2020-12-05 NOTE — Patient Instructions (Signed)
INCREASE Coreg to 18.75 mg (one and one half tab) twice a day CHANGE Lasix to 20 mg every Monday Wednesday and Friday  Your physician recommends that you schedule a follow-up appointment in: 3 months  Do the following things EVERYDAY: 1) Weigh yourself in the morning before breakfast. Write it down and keep it in a log. 2) Take your medicines as prescribed 3) Eat low salt foods--Limit salt (sodium) to 2000 mg per day.  4) Stay as active as you can everyday 5) Limit all fluids for the day to less than 2 liters   At the [facility], you and your health needs are our priority. As part of our continuing mission to provide you with exceptional heart care, we have created designated Provider Care Teams. These Care Teams include your primary Cardiologist (physician) and Advanced Practice Providers (APPs- Physician Assistants and Nurse Practitioners) who all work together to provide you with the care you need, when you need it.   You may see any of the following providers on your designated Care Team at your next follow up: Marland Kitchen Dr Glori Bickers . Dr Loralie Champagne . Dr Vickki Muff . Darrick Grinder, NP . Lyda Jester, [place] . Audry Riles, PharmD   Please be sure to bring in all your medications bottles to every appointment.

## 2020-12-05 NOTE — Addendum Note (Signed)
Encounter addended by: Jorge Ny, LCSW on: 12/05/2020 2:06 PM  Actions taken: Flowsheet accepted, Clinical Note Signed

## 2020-12-05 NOTE — Progress Notes (Signed)
Heart and Vascular Care Navigation  12/05/2020  [name] 01/02/1976 [id number]  Reason for Referral: insurance concerns   Engaged with patient face to face for follow up visit for Heart and Vascular Care Coordination.                                                                                                   Assessment:  CSW met with pt during clinic visit to discuss current lack of insurance.  Had already spoken with CSW coworker regarding same issues and had applied for CAFA.  Per financial counseling notes pt did not turn in all necessary documents and did not turn them in on time after being notified what was missing.  Pt will not be eligible to apply for CAFA again until Sept (6 months following first application).  Discussed applying for [employer] Card to help with community PCP and specialty visits as well as non heart failure medications- pt agreeable to apply- application provided.  Pt is working part time at [employer] but apparently missed enrollment opportunity with work so cannot get coverage through job at this time.  Pt wanting to apply for Medicaid- has family planning Medicaid which she started receiving awhile ago- states last time she applied for Medicaid she didn't have any medical concerns.  Also has a minor child in her custody so could assess if she would qualify for Family and Children Medicaid.  CSW provided with information on how to apply online.                                    HRT/VAS Care Coordination    Living arrangements for the past 2 months Single Family Home   Lives with: Minor child   Home Assistive Devices/Equipment None   DME Agency NA      Social History:                                                                             SDOH Screenings   Alcohol Screen: Low Risk   . Last Alcohol Screening Score (AUDIT): 0  Depression (PHQ2-9): Low Risk   . PHQ-2 Score: 2  Financial Resource Strain: Medium Risk  . Difficulty of Paying  Living Expenses: Somewhat hard  Food Insecurity: No Food Insecurity  . Worried About Charity fundraiser in the Last Year: Never true  . Ran Out of Food in the Last Year: Never true  Housing: Medium Risk  . Last Housing Risk Score: 1  Physical Activity: Sufficiently Active  . Days of Exercise per Week: 3 days  . Minutes of Exercise per Session: 120 min  Social Connections: Unknown  . Frequency of Communication with Friends and Family: Three times a  week  . Frequency of Social Gatherings with Friends and Family: Not on file  . Attends Religious Services: Not on file  . Active Member of Clubs or Organizations: Not on file  . Attends Archivist Meetings: 1 to 4 times per year  . Marital Status: Not on file  Stress: Not on file  Tobacco Use: Low Risk   . Smoking Tobacco Use: Never Smoker  . Smokeless Tobacco Use: Never Used  Transportation Needs: No Transportation Needs  . Lack of Transportation (Medical): No  . Lack of Transportation (Non-Medical): No    SDOH Interventions: Financial Resources:   pt working part time  Landscape architect:   has submitted application for food [place]:   none reported at this time  Transportation:   Transportation Interventions: Intervention Not Indicated- uses private vehicle   Follow-up plan:    Pt to complete [employer] application and return to [place] when complete.  Pt to complete Medicaid application online.  Will continue to follow and assist as needed  Jorge Ny, [facility] Desk#: [phone] Cell#: [phone]

## 2020-12-06 ENCOUNTER — Other Ambulatory Visit (HOSPITAL_COMMUNITY): Payer: Self-pay | Admitting: Cardiology

## 2020-12-06 ENCOUNTER — Other Ambulatory Visit (HOSPITAL_COMMUNITY): Payer: Self-pay

## 2020-12-06 MED ORDER — CARVEDILOL 12.5 MG PO TABS
18.7500 mg | ORAL_TABLET | Freq: Two times a day (BID) | ORAL | 3 refills | Status: DC
  Filled 2020-12-06: qty 90, 30d supply, fill #0

## 2020-12-06 MED ORDER — ATORVASTATIN CALCIUM 20 MG PO TABS
1.0000 | ORAL_TABLET | Freq: Every day | ORAL | 3 refills | Status: DC
  Filled 2020-12-06: qty 30, 30d supply, fill #0
  Filled 2021-01-09: qty 30, 30d supply, fill #1
  Filled 2021-02-06: qty 30, 30d supply, fill #2
  Filled 2021-03-14: qty 30, 30d supply, fill #3

## 2020-12-06 MED ORDER — DAPAGLIFLOZIN PROPANEDIOL 10 MG PO TABS
ORAL_TABLET | Freq: Every day | ORAL | 2 refills | Status: DC
  Filled 2020-12-06: qty 30, 30d supply, fill #0

## 2020-12-06 MED ORDER — SACUBITRIL-VALSARTAN 97-103 MG PO TABS
1.0000 | ORAL_TABLET | Freq: Two times a day (BID) | ORAL | 3 refills | Status: DC
  Filled 2020-12-06: qty 60, 30d supply, fill #0

## 2020-12-06 MED ORDER — CARVEDILOL 12.5 MG PO TABS: 18.7500 mg | ORAL_TABLET | Freq: Two times a day (BID) | ORAL | 3 refills | Status: DC

## 2020-12-06 MED ORDER — SPIRONOLACTONE 25 MG PO TABS
1.0000 | ORAL_TABLET | Freq: Every day | ORAL | 3 refills | Status: DC
  Filled 2020-12-06: qty 30, 30d supply, fill #0
  Filled 2021-02-06: qty 30, 30d supply, fill #1
  Filled 2021-03-21: qty 30, 30d supply, fill #2
  Filled 2021-04-28: qty 30, 30d supply, fill #3

## 2020-12-06 MED ORDER — FUROSEMIDE 20 MG PO TABS: 20.0000 mg | ORAL_TABLET | ORAL | 2 refills | Status: DC

## 2020-12-06 MED ORDER — FUROSEMIDE 20 MG PO TABS
20.0000 mg | ORAL_TABLET | ORAL | 2 refills | Status: DC
  Filled 2020-12-06: qty 12, 28d supply, fill #0

## 2020-12-06 NOTE — Addendum Note (Signed)
Encounter addended by: Kerry Dory, CMA on: 12/06/2020 2:32 PM  Actions taken: Pharmacy for encounter modified, Order list changed

## 2020-12-08 ENCOUNTER — Other Ambulatory Visit: Payer: Self-pay

## 2020-12-08 ENCOUNTER — Other Ambulatory Visit (HOSPITAL_COMMUNITY): Payer: Self-pay

## 2020-12-08 ENCOUNTER — Telehealth: Payer: Self-pay | Admitting: Family

## 2020-12-08 DIAGNOSIS — E119 Type 2 diabetes mellitus without complications: Secondary | ICD-10-CM

## 2020-12-08 MED ORDER — METFORMIN HCL ER 500 MG PO TB24
1500.0000 mg | ORAL_TABLET | Freq: Every morning | ORAL | 1 refills | Status: DC
  Filled 2020-12-08: qty 90, 30d supply, fill #0

## 2020-12-08 MED ORDER — FUROSEMIDE 20 MG PO TABS
20.0000 mg | ORAL_TABLET | ORAL | 2 refills | Status: DC
  Filled 2020-12-08: qty 10, 30d supply, fill #0
  Filled 2021-01-09: qty 10, 30d supply, fill #1
  Filled 2021-02-06: qty 10, 30d supply, fill #2

## 2020-12-08 MED ORDER — CARVEDILOL 12.5 MG PO TABS
18.7500 mg | ORAL_TABLET | Freq: Two times a day (BID) | ORAL | 3 refills | Status: DC
  Filled 2020-12-08: qty 90, 30d supply, fill #0
  Filled 2021-01-19: qty 90, 30d supply, fill #1
  Filled 2021-03-20: qty 90, 30d supply, fill #2
  Filled 2021-05-19: qty 90, 30d supply, fill #3

## 2020-12-08 NOTE — Addendum Note (Signed)
Addended by: Kerry Dory on: 12/08/2020 11:18 AM   Modules accepted: Orders

## 2020-12-08 NOTE — Telephone Encounter (Signed)
Pt states she would like a refill on her medicationmetFORMIN (GLUCOPHAGE-XR) 500 MG 24 hr tablet [[id number]]. She Is aware that she has no refills left. Pt states she has no more pills and would like the medication sent to  [facility] [place], [place], [place]. Please follow up pt regarding her issue.

## 2020-12-08 NOTE — Progress Notes (Signed)
Metformin refilled

## 2020-12-23 ENCOUNTER — Other Ambulatory Visit: Payer: Self-pay

## 2020-12-23 ENCOUNTER — Other Ambulatory Visit (HOSPITAL_COMMUNITY): Payer: Self-pay

## 2020-12-23 ENCOUNTER — Encounter: Payer: Self-pay | Admitting: Family

## 2020-12-23 ENCOUNTER — Ambulatory Visit (INDEPENDENT_AMBULATORY_CARE_PROVIDER_SITE_OTHER): Payer: Self-pay | Admitting: Family

## 2020-12-23 VITALS — BP 132/86 | HR 81 | Temp 98.2°F | Resp 17 | Ht 63.9 in | Wt 220.2 lb

## 2020-12-23 DIAGNOSIS — D509 Iron deficiency anemia, unspecified: Secondary | ICD-10-CM

## 2020-12-23 DIAGNOSIS — E119 Type 2 diabetes mellitus without complications: Secondary | ICD-10-CM

## 2020-12-23 LAB — POCT GLYCOSYLATED HEMOGLOBIN (HGB A1C): Hemoglobin A1C: 7.2 % — AB (ref 4.0–5.6)

## 2020-12-23 MED ORDER — METFORMIN HCL ER 500 MG PO TB24
1500.0000 mg | ORAL_TABLET | Freq: Every morning | ORAL | 2 refills | Status: DC
  Filled 2020-12-23 – 2021-01-09 (×2): qty 90, 30d supply, fill #0
  Filled 2021-02-17: qty 90, 30d supply, fill #1
  Filled 2021-04-02: qty 90, 30d supply, fill #2

## 2020-12-23 MED ORDER — DAPAGLIFLOZIN PROPANEDIOL 10 MG PO TABS
10.0000 mg | ORAL_TABLET | Freq: Every day | ORAL | 0 refills | Status: DC
  Filled 2020-12-23: qty 30, 30d supply, fill #0

## 2020-12-23 MED ORDER — IRON (FERROUS SULFATE) 325 (65 FE) MG PO TABS
325.0000 mg | ORAL_TABLET | Freq: Every day | ORAL | 0 refills | Status: DC
  Filled 2020-12-23: qty 90, fill #0

## 2020-12-23 NOTE — Progress Notes (Signed)
Diabetes f/u  

## 2020-12-23 NOTE — Progress Notes (Signed)
Patient ID: [name], female    DOB: [birth year]  MRN: [id number]  CC: Diabetes Follow-Up  Subjective: [name] is a 45 y.o. female who presents for diabetes follow-up. Her concerns today include:   1. DIABETES TYPE 2 FOLLOW-UP: 11/23/2020: - Hemoglobin close to goal at 7.2% on 10/06/2020, goal < 7%. Next hemoglobin A1c due May 2022. - Continue Metformin and Farxiga as prescribed.   12/23/2020: Last A1C: 7.2% on 12/23/2020 Med Adherence:  [x]  Yes    []  No Medication side effects:  []  Yes    [x]  No  2. ANEMIA FOLLOW-UP: Reports she was not aware to take Ferrous Sulfate since last visit.   Patient Active Problem List   Diagnosis Date Noted  . Iron deficiency anemia 11/24/2020  . Exertional dyspnea 10/04/2020  . Hypertensive urgency 10/04/2020  . Left lumbar radiculitis 03/23/2019  . Vitamin D deficiency disease 06/04/2018  . Routine general medical examination at a health care facility 05/15/2017  . Cervical cancer screening 05/15/2017  . Dyslipidemia, goal LDL below 100 04/04/2017  . Snoring 10/25/2016  . Obesity (BMI 30-39.9) 03/09/2014  . Diuretic-induced hypokalemia 02/13/2013  . Diabetes mellitus without complication ([place]) 78/29/5621  . 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 atorvastatin (LIPITOR) 20 MG tablet TAKE 1 TABLET (20 MG TOTAL) BY MOUTH DAILY. 30 tablet 3  . carvedilol (COREG) 12.5 MG tablet Take 1.5 tablets (18.75 mg total) by mouth 2 (two) times daily with a meal. 90 tablet 3  . furosemide (LASIX) 20 MG tablet Take 1 tablet (20 mg total) by mouth every Monday, Wednesday, and Friday. 30 tablet 2  . levonorgestrel (MIRENA) 20 MCG/24HR IUD 1 Intra Uterine Device (1 each total) by Intrauterine route once. 1 each 0  . sacubitril-valsartan (ENTRESTO) 97-103 MG TAKE 1 TABLET BY MOUTH TWICE DAILY. 60 tablet 3  . spironolactone (ALDACTONE) 25 MG tablet TAKE 1 TABLET  (25 MG TOTAL) BY MOUTH AT BEDTIME. 30 tablet 3   No current facility-administered medications on file prior to visit.    No Known Allergies  Social History   Socioeconomic History  . Marital status: Single    Spouse name: Not on file  . Number of children: 2  . Years of education: Not on file  . Highest education level: Not on file  Occupational History    Employer: [employer]  Tobacco Use  . Smoking status: Never Smoker  . Smokeless tobacco: Never Used  Vaping Use  . Vaping Use: Never used  Substance and Sexual Activity  . Alcohol use: Yes  . Drug use: No  . Sexual activity: Yes    Birth control/protection: I.U.D.    Comment: Mirena IUD inserted 2012?,intercourse age 75, more than 5 sexual partners  Other Topics Concern  . Not on file  Social History Narrative  . Not on file   Social Determinants of Health   Financial Resource Strain: Medium Risk  . Difficulty of Paying Living Expenses: Somewhat hard  Food Insecurity: No Food Insecurity  . Worried About Charity fundraiser in the Last Year: Never true  . Ran Out of Food in the Last Year: Never true  Transportation Needs: No Transportation Needs  . Lack of Transportation (Medical): No  . Lack of Transportation (Non-Medical): No  Physical Activity: Sufficiently Active  . Days of Exercise per Week: 3 days  . Minutes of Exercise per Session: 120 min  Stress: Not on file  Social Connections: Unknown  . Frequency of Communication with Friends and Family: Three times a week  . Frequency of Social Gatherings with Friends and Family: Not on file  . Attends Religious Services: Not on file  . Active Member of Clubs or Organizations: Not on file  . Attends Archivist Meetings: 1 to 4 times per year  . Marital Status: Not on file  Intimate Partner Violence: Not on file    Family History  Problem Relation Age of Onset  . Diabetes Mother   . Heart disease Mother   . Hypertension Maternal Aunt   . Breast cancer Neg  Hx     Past Surgical History:  Procedure Laterality Date  . CESAREAN SECTION  2006    ROS: Review of Systems Negative except as stated above  PHYSICAL EXAM: BP 132/86 (BP Location: Left Arm, Patient Position: Sitting, Cuff Size: Normal)   Pulse 81   Temp 98.2 F (36.8 C)   Resp 17   Ht 5' 3.9" (1.623 m)   Wt 220 lb 3.2 oz (99.9 kg)   SpO2 96%   BMI 37.92 kg/m   Physical Exam HENT:     Head: Normocephalic and atraumatic.  Eyes:     Extraocular Movements: Extraocular movements intact.     Conjunctiva/sclera: Conjunctivae normal.     Pupils: Pupils are equal, round, and reactive to light.  Cardiovascular:     Rate and Rhythm: Normal rate and regular rhythm.     Pulses: Normal pulses.     Heart sounds: Normal heart sounds.  Pulmonary:     Effort: Pulmonary effort is normal.     Breath sounds: Normal breath sounds.  Musculoskeletal:     Cervical back: Normal range of motion and neck supple.  Neurological:     General: No focal deficit present.     Mental Status: She is alert and oriented to person, place, and time.  Psychiatric:        Mood and Affect: Mood normal.        Behavior: Behavior normal.     ASSESSMENT AND PLAN: 1. Type 2 diabetes mellitus without complication, without long-term current use of insulin ([place]): - Hemoglobin A1c today close to goal at 7.2%, goal < 7%. This is the same as previous hemoglobin A1c of 7.2% on 10/05/2020. Next hemoglobin A1c due August 2022. - Continue Metformin and Dapagliflozin Propanediol as prescribed.  - Discussed the importance of healthy eating habits, low-carbohydrate diet, low-sugar diet, regular aerobic exercise (at least 150 minutes a week as tolerated) and medication compliance to achieve or maintain control of diabetes. - Follow-up with primary provider in 3 months or sooner if needed.  - POCT glycosylated hemoglobin (Hb A1C) - dapagliflozin propanediol (FARXIGA) 10 MG TABS tablet; Take 1 tablet (10 mg total) by mouth  daily before breakfast.  Dispense: 90 tablet; Refill: 0 - metFORMIN (GLUCOPHAGE-XR) 500 MG 24 hr tablet; TAKE 3 TABLETS (1,500 MG TOTAL) BY MOUTH EVERY MORNING.  Dispense: 90 tablet; Refill: 2  2. Iron deficiency anemia, unspecified iron deficiency anemia type: - Patient has not taken prescribed iron supplement since last visit as she was unaware to be taking this. - Begin Iron Ferrous Sulfate as prescribed. Also, increase iron-rich foods such as dark green leafy vegetables and dried fruit such as raisins and apricots.  - Patient encouraged to have rechecked in 12 weeks or sooner if needed.  - Iron, Ferrous Sulfate, 325 (65 Fe) MG TABS; Take  325 mg by mouth daily.  Dispense: 90 tablet; Refill: 0     Patient was given the opportunity to ask questions.  Patient verbalized understanding of the plan and was able to repeat key elements of the plan. Patient was given clear instructions to go to Emergency Department or return to medical center if symptoms don't improve, worsen, or new problems develop.The patient verbalized understanding.   Orders Placed This Encounter  Procedures  . POCT glycosylated hemoglobin (Hb A1C)     Requested Prescriptions   Signed Prescriptions Disp Refills  . dapagliflozin propanediol (FARXIGA) 10 MG TABS tablet 90 tablet 0    Sig: TAKE 1 TABLET (10 MG TOTAL) BY MOUTH DAILY BEFORE BREAKFAST.  . metFORMIN (GLUCOPHAGE-XR) 500 MG 24 hr tablet 90 tablet 2    Sig: TAKE 3 TABLETS (1,500 MG TOTAL) BY MOUTH EVERY MORNING.  . Iron, Ferrous Sulfate, 325 (65 Fe) MG TABS 90 tablet 0    Sig: Take 325 mg by mouth daily.   Follow-up with primary provider for diabetes check-up in 3 months or sooner if needed.   Camillia Herter, NP

## 2020-12-27 ENCOUNTER — Other Ambulatory Visit (HOSPITAL_COMMUNITY): Payer: Self-pay

## 2021-01-09 ENCOUNTER — Other Ambulatory Visit (HOSPITAL_COMMUNITY): Payer: Self-pay

## 2021-01-13 ENCOUNTER — Encounter: Payer: Medicaid Other | Admitting: Student

## 2021-01-20 ENCOUNTER — Other Ambulatory Visit (HOSPITAL_COMMUNITY): Payer: Self-pay

## 2021-02-06 ENCOUNTER — Other Ambulatory Visit (HOSPITAL_COMMUNITY): Payer: Self-pay

## 2021-02-17 ENCOUNTER — Other Ambulatory Visit (HOSPITAL_COMMUNITY): Payer: Self-pay

## 2021-03-03 NOTE — Progress Notes (Signed)
Advanced Heart Failure Clinic Note   Primary Care: Dr. Ronnald Ramp  HF Cardiologist: Dr. Aundra Dubin HPI: [name] is a 45 year old with history of HTN, hyperlipidemia, DMII, snores, and chronic combined systolic/diastolic heart failure.   Of note she ran out of medical insurance back in October and has been unable to get her medications.   Admitted 10/04/20 with hypertensive urgency and shortness of breath. HS trop 19>20>22. BNP was 118. She had been out meds since October. Started back on coreg and irbesartan.  She was discharged the next day on lasix as needed.    She returned 4/22 for HF follow up. Weight at home 221 pounds. Dancing 3x/day. Works part time at [employer]. Lives with her 2 sons. Stable NYHA II, volume good. BP still high, carvedilol increased.  Today she returns for HF follow up. Overall feeling fine. Having a sharp pain under left breast, happens periodically, and resolves spontaneously. Denies increasing SOB,  dizziness, edema, or PND/Orthopnea. Appetite ok. No fever or chills. Weight at home 227 pounds. Taking all medications, missed a couple doses of evening medications. Drinking >2L/day.  Cardiac Testing  - Echo (2/22): EF 40-45% RV normal grade II DD - Echo (2013): EF 55-60%  ROS: All systems reviewed and negative except as per HPI.   Past Medical History:  Diagnosis Date   CHF (congestive heart failure) (HCC)    Diabetes mellitus    HISTORY OF GESTATIONAL DIABETES   Hypertension    IUD    MIRENA INSERTED 01/24/11   Current Outpatient Medications  Medication Sig Dispense Refill   atorvastatin (LIPITOR) 20 MG tablet TAKE 1 TABLET (20 MG TOTAL) BY MOUTH DAILY. 30 tablet 3   carvedilol (COREG) 12.5 MG tablet Take 1.5 tablets (18.75 mg total) by mouth 2 (two) times daily with a meal. 90 tablet 3   dapagliflozin propanediol (FARXIGA) 10 MG TABS tablet Take 1 tablet (10 mg total) by mouth daily before breakfast. 90 tablet 0   Iron, Ferrous Sulfate, 325 (65 Fe) MG TABS Take  325 mg by mouth daily. 90 tablet 0   levonorgestrel (MIRENA) 20 MCG/24HR IUD 1 Intra Uterine Device (1 each total) by Intrauterine route once. 1 each 0   metFORMIN (GLUCOPHAGE-XR) 500 MG 24 hr tablet TAKE 3 TABLETS (1,500 MG TOTAL) BY MOUTH EVERY MORNING. 90 tablet 2   sacubitril-valsartan (ENTRESTO) 97-103 MG TAKE 1 TABLET BY MOUTH TWICE DAILY. 60 tablet 3   spironolactone (ALDACTONE) 25 MG tablet TAKE 1 TABLET (25 MG TOTAL) BY MOUTH AT BEDTIME. 30 tablet 3   furosemide (LASIX) 20 MG tablet Take 1 tablet (20 mg total) by mouth daily. 30 tablet 2   No current facility-administered medications for this encounter.   No Known Allergies  Social History   Socioeconomic History   Marital status: Single    Spouse name: Not on file   Number of children: 2   Years of education: Not on file   Highest education level: Not on file  Occupational History    Employer: [employer]  Tobacco Use   Smoking status: Never   Smokeless tobacco: Never  Vaping Use   Vaping Use: Never used  Substance and Sexual Activity   Alcohol use: Yes   Drug use: No   Sexual activity: Yes    Birth control/protection: I.U.D.    Comment: Mirena IUD inserted 2012?,intercourse age 58, more than 5 sexual partners  Other Topics Concern   Not on file  Social History Narrative   Not on  file   Social Determinants of Health   Financial Resource Strain: Medium Risk   Difficulty of Paying Living Expenses: Somewhat hard  Food Insecurity: No Food Insecurity   Worried About Charity fundraiser in the Last Year: Never true   Ran Out of Food in the Last Year: Never true  Transportation Needs: No Transportation Needs   Lack of Transportation (Medical): No   Lack of Transportation (Non-Medical): No  Physical Activity: Sufficiently Active   Days of Exercise per Week: 3 days   Minutes of Exercise per Session: 120 min  Stress: Not on file  Social Connections: Unknown   Frequency of Communication with Friends and Family: Three  times a week   Frequency of Social Gatherings with Friends and Family: Not on file   Attends Religious Services: Not on file   Active Member of Clubs or Organizations: Not on file   Attends Archivist Meetings: 1 to 4 times per year   Marital Status: Not on file  Intimate Partner Violence: Not on file   Family History  Problem Relation Age of Onset   Diabetes Mother    Heart disease Mother    Hypertension Maternal Aunt    Breast cancer Neg Hx    BP (!) 152/110   Pulse 86   Wt 104.1 kg (229 lb 6.4 oz)   SpO2 95%   BMI 39.50 kg/m   Wt Readings from Last 3 Encounters:  03/06/21 104.1 kg (229 lb 6.4 oz)  12/23/20 99.9 kg (220 lb 3.2 oz)  12/05/20 101.9 kg (224 lb 9.6 oz)   PHYSICAL EXAM: General:  NAD. No resp difficulty HEENT: Normal Neck: Supple. JVP 6-7. Carotids 2+ bilat; no bruits. No lymphadenopathy or thryomegaly appreciated. Cor: PMI nondisplaced. Regular rate & rhythm. No rubs, gallops or murmurs. Lungs: Clear Abdomen: Obese,  nontender, nondistended. No hepatosplenomegaly. No bruits or masses. Good bowel sounds. Extremities: No cyanosis, clubbing, rash, edema Neuro: Alert & oriented x 3, cranial nerves grossly intact. Moves all 4 extremities w/o difficulty. Affect pleasant.  ASSESSMENT & PLAN: 1. Chronic Combined Systolic/Diastolic Heart Failure  - Echo 09/2020 EF 40-45% RV normal  - NYHA II. Mildly volume overloaded. - Increase lasix to 20 mg daily. - Continue carvedilol 18.75 mg bid.  - Continue Farxiga 10 mg daily. - Continue Enstresto 97/103 mg bid. - Continue spiro 25 mg daily. - BMET today, repeat 10 days.  2. DMII - Most recent Hgb A1C 7.2.  - On metformin + Farxiga. - Per PCP.  3. HTN - Uncontrolled. Goal SBP <130 DBP < 80. - She was given a monitor at last visit and instructed to check BP daily. Reinforced instructions to log BP and bring to next visit. - Will also need sleep study when able.   4. Snoring - Suspected sleep apnea.  -  Eventually will need sleep study (Medicaid Family)  5. Obesity - Body mass index is 39.5 kg/m.  - Discussed portation control.    She has been referred to SW for assistance with insurance.   Follow up 3 weeks with APP to assess volume and BP.  [place] FNP 03/06/21

## 2021-03-06 ENCOUNTER — Other Ambulatory Visit: Payer: Self-pay

## 2021-03-06 ENCOUNTER — Other Ambulatory Visit (HOSPITAL_COMMUNITY): Payer: Self-pay

## 2021-03-06 ENCOUNTER — Encounter (HOSPITAL_COMMUNITY): Payer: Self-pay

## 2021-03-06 ENCOUNTER — Ambulatory Visit (HOSPITAL_COMMUNITY)
Admission: RE | Admit: 2021-03-06 | Discharge: 2021-03-06 | Disposition: A | Discharge status: Home / Self Care | Payer: Self-pay | Source: Ambulatory Visit | Attending: Family Medicine | Admitting: Family Medicine

## 2021-03-06 VITALS — BP 152/110 | HR 86 | Wt 229.4 lb

## 2021-03-06 DIAGNOSIS — Z79899 Other long term (current) drug therapy: Secondary | ICD-10-CM | POA: Insufficient documentation

## 2021-03-06 DIAGNOSIS — R0683 Snoring: Secondary | ICD-10-CM | POA: Insufficient documentation

## 2021-03-06 DIAGNOSIS — I10 Essential (primary) hypertension: Secondary | ICD-10-CM

## 2021-03-06 DIAGNOSIS — I110 Hypertensive heart disease with heart failure: Secondary | ICD-10-CM | POA: Insufficient documentation

## 2021-03-06 DIAGNOSIS — Z7984 Long term (current) use of oral hypoglycemic drugs: Secondary | ICD-10-CM | POA: Insufficient documentation

## 2021-03-06 DIAGNOSIS — E785 Hyperlipidemia, unspecified: Secondary | ICD-10-CM | POA: Insufficient documentation

## 2021-03-06 DIAGNOSIS — Z6839 Body mass index (BMI) 39.0-39.9, adult: Secondary | ICD-10-CM | POA: Insufficient documentation

## 2021-03-06 DIAGNOSIS — E669 Obesity, unspecified: Secondary | ICD-10-CM | POA: Insufficient documentation

## 2021-03-06 DIAGNOSIS — I509 Heart failure, unspecified: Secondary | ICD-10-CM

## 2021-03-06 DIAGNOSIS — E119 Type 2 diabetes mellitus without complications: Secondary | ICD-10-CM | POA: Insufficient documentation

## 2021-03-06 DIAGNOSIS — Z8249 Family history of ischemic heart disease and other diseases of the circulatory system: Secondary | ICD-10-CM | POA: Insufficient documentation

## 2021-03-06 DIAGNOSIS — I5042 Chronic combined systolic (congestive) and diastolic (congestive) heart failure: Secondary | ICD-10-CM | POA: Insufficient documentation

## 2021-03-06 LAB — BASIC METABOLIC PANEL
Anion gap: 7 (ref 5–15)
BUN: 12 mg/dL (ref 6–20)
CO2: 28 mmol/L (ref 22–32)
Calcium: 9.6 mg/dL (ref 8.9–10.3)
Chloride: 103 mmol/L (ref 98–111)
Creatinine, Ser: 0.93 mg/dL (ref 0.44–1.00)
GFR, Estimated: >60 mL/min (ref >60)
Glucose, Bld: 113 mg/dL — ABNORMAL HIGH (ref 70–99)
Potassium: 3.8 mmol/L (ref 3.5–5.1)
Sodium: 138 mmol/L (ref 135–145)

## 2021-03-06 MED ORDER — FUROSEMIDE 20 MG PO TABS
20.0000 mg | ORAL_TABLET | Freq: Every day | ORAL | 2 refills | Status: DC
  Filled 2021-03-06: qty 30, 30d supply, fill #0
  Filled 2021-04-02: qty 30, 30d supply, fill #1
  Filled 2021-04-28: qty 30, 30d supply, fill #2

## 2021-03-06 MED ORDER — FUROSEMIDE 20 MG PO TABS: 20.0000 mg | ORAL_TABLET | Freq: Every day | ORAL | 2 refills | Status: DC

## 2021-03-06 NOTE — Patient Instructions (Signed)
INCREASE Lasix to 20 mg, one tab daily  Labs today We will only contact you if something comes back abnormal or we need to make some changes. Otherwise no news is good news!  Labs needed in 7-10 dys  Your physician recommends that you schedule a follow-up appointment in: 3-4 weeks  in the Advanced Practitioners (PA/NP) Clinic    Do the following things EVERYDAY: Weigh yourself in the morning before breakfast. Write it down and keep it in a log. Take your medicines as prescribed Eat low salt foods--Limit salt (sodium) to 2000 mg per day.  Stay as active as you can everyday Limit all fluids for the day to less than 2 liters  milAt the [facility] Clinic, you and your health needs are our priority. As part of our continuing mission to provide you with exceptional heart care, we have created designated Provider Care Teams. These Care Teams include your primary Cardiologist (physician) and Advanced Practice Providers (APPs- Physician Assistants and Nurse Practitioners) who all work together to provide you with the care you need, when you need it.   You may see any of the following providers on your designated Care Team at your next follow up: Dr Glori Bickers Dr Loralie Champagne Dr Patrice Paradise, NP Lyda Jester, [place] Ginnie Smart Audry Riles, PharmD   Please be sure to bring in all your medications bottles to every appointment.   If you have any questions or concerns before your next appointment please send [place] a message through [place] or call our office at [phone].    TO LEAVE A MESSAGE FOR THE NURSE SELECT OPTION 2, PLEASE LEAVE A MESSAGE INCLUDING: YOUR NAME DATE OF BIRTH CALL BACK NUMBER REASON FOR CALL**this is important as we prioritize the call backs  YOU WILL RECEIVE A CALL BACK THE SAME DAY AS LONG AS YOU CALL BEFORE 4:00 PM

## 2021-03-14 ENCOUNTER — Other Ambulatory Visit (HOSPITAL_COMMUNITY): Payer: Self-pay

## 2021-03-16 ENCOUNTER — Ambulatory Visit (HOSPITAL_COMMUNITY)
Admission: RE | Admit: 2021-03-16 | Discharge: 2021-03-16 | Disposition: A | Discharge status: Home / Self Care | Payer: Medicaid Other | Source: Ambulatory Visit | Attending: Cardiology | Admitting: Cardiology

## 2021-03-16 ENCOUNTER — Other Ambulatory Visit: Payer: Self-pay

## 2021-03-16 DIAGNOSIS — I509 Heart failure, unspecified: Secondary | ICD-10-CM | POA: Insufficient documentation

## 2021-03-16 LAB — BASIC METABOLIC PANEL
Anion gap: 8 (ref 5–15)
BUN: 14 mg/dL (ref 6–20)
CO2: 26 mmol/L (ref 22–32)
Calcium: 9.2 mg/dL (ref 8.9–10.3)
Chloride: 104 mmol/L (ref 98–111)
Creatinine, Ser: 1.01 mg/dL — ABNORMAL HIGH (ref 0.44–1.00)
GFR, Estimated: >60 mL/min (ref >60)
Glucose, Bld: 136 mg/dL — ABNORMAL HIGH (ref 70–99)
Potassium: 4.5 mmol/L (ref 3.5–5.1)
Sodium: 138 mmol/L (ref 135–145)

## 2021-03-20 ENCOUNTER — Other Ambulatory Visit (HOSPITAL_COMMUNITY): Payer: Self-pay

## 2021-03-21 ENCOUNTER — Other Ambulatory Visit (HOSPITAL_COMMUNITY): Payer: Self-pay

## 2021-03-22 ENCOUNTER — Other Ambulatory Visit (HOSPITAL_COMMUNITY): Payer: Self-pay

## 2021-03-25 NOTE — Progress Notes (Signed)
Advanced Heart Failure Clinic Note   Primary Care: Alexis Fruits, NP Primary Cardiologist: none  HPI: [name] is a 45 y.o. with history of HTN, hyperlipidemia, DMII, snores, and chronic combined systolic/diastolic heart failure.   She ran out of medical insurance 10/21 and was unable to get her medications.   Admitted 10/04/20 with hypertensive urgency and shortness of breath. HS trop 19>20>22. BNP was 118. She had been out meds since October. Started back on Coreg and irbesartan.  She was discharged the next day on lasix prn.  She was seen in the [facility] clinic and referred to AHF clinic for further GDMT optimization.   Today she returns for HF follow up. Overall feeling fine. Some SOB with working in the heat. Denies increasing dizziness, edema, or PND/Orthopnea. Appetite ok. No fever or chills. Weight at home 232 pounds. Taking all medications, still missing a couple doses of her evening medications. Drinking >2L fluid/day, but she has cut out soda. Dancing 3x/week. Works part time at [employer]. Lives with her 2 sons.   Cardiac Testing  - Echo (2/22): EF 40-45% RV normal grade II DD - Echo (2013): EF 55-60%  ROS: All systems reviewed and negative except as per HPI.   Past Medical History:  Diagnosis Date   CHF (congestive heart failure) (HCC)    Diabetes mellitus    HISTORY OF GESTATIONAL DIABETES   Hypertension    IUD    MIRENA INSERTED 01/24/11   Current Outpatient Medications  Medication Sig Dispense Refill   atorvastatin (LIPITOR) 20 MG tablet TAKE 1 TABLET (20 MG TOTAL) BY MOUTH DAILY. 30 tablet 3   carvedilol (COREG) 12.5 MG tablet Take 1.5 tablets (18.75 mg total) by mouth 2 (two) times daily with a meal. 90 tablet 3   dapagliflozin propanediol (FARXIGA) 10 MG TABS tablet Take 1 tablet (10 mg total) by mouth daily before breakfast. 90 tablet 0   furosemide (LASIX) 20 MG tablet Take 1 tablet (20 mg total) by mouth daily. 30 tablet 2   Iron, Ferrous Sulfate, 325 (65 Fe) MG  TABS Take 325 mg by mouth daily. 90 tablet 0   levonorgestrel (MIRENA) 20 MCG/24HR IUD 1 Intra Uterine Device (1 each total) by Intrauterine route once. 1 each 0   metFORMIN (GLUCOPHAGE-XR) 500 MG 24 hr tablet TAKE 3 TABLETS (1,500 MG TOTAL) BY MOUTH EVERY MORNING. 90 tablet 2   sacubitril-valsartan (ENTRESTO) 97-103 MG TAKE 1 TABLET BY MOUTH TWICE DAILY. 60 tablet 3   spironolactone (ALDACTONE) 25 MG tablet TAKE 1 TABLET (25 MG TOTAL) BY MOUTH AT BEDTIME. 30 tablet 3   No current facility-administered medications for this encounter.   No Known Allergies  Social History   Socioeconomic History   Marital status: Single    Spouse name: Not on file   Number of children: 2   Years of education: Not on file   Highest education level: Not on file  Occupational History    Employer: [employer]  Tobacco Use   Smoking status: Never   Smokeless tobacco: Never  Vaping Use   Vaping Use: Never used  Substance and Sexual Activity   Alcohol use: Yes   Drug use: No   Sexual activity: Yes    Birth control/protection: I.U.D.    Comment: Mirena IUD inserted 2012?,intercourse age 54, more than 5 sexual partners  Other Topics Concern   Not on file  Social History Narrative   Not on file   Social Determinants of Radio broadcast assistant  Strain: Medium Risk   Difficulty of Paying Living Expenses: Somewhat hard  Food Insecurity: No Food Insecurity   Worried About Charity fundraiser in the Last Year: Never true   Ran Out of Food in the Last Year: Never true  Transportation Needs: No Transportation Needs   Lack of Transportation (Medical): No   Lack of Transportation (Non-Medical): No  Physical Activity: Sufficiently Active   Days of Exercise per Week: 3 days   Minutes of Exercise per Session: 120 min  Stress: Not on file  Social Connections: Unknown   Frequency of Communication with Friends and Family: Three times a week   Frequency of Social Gatherings with Friends and Family: Not on file    Attends Religious Services: Not on file   Active Member of Clubs or Organizations: Not on file   Attends Archivist Meetings: 1 to 4 times per year   Marital Status: Not on file  Intimate Partner Violence: Not on file   Family History  Problem Relation Age of Onset   Diabetes Mother    Heart disease Mother    Hypertension Maternal Aunt    Breast cancer Neg Hx    BP (!) 138/98   Pulse 87   Wt 106.8 kg (235 lb 6.4 oz)   SpO2 94%   BMI 40.54 kg/m   Wt Readings from Last 3 Encounters:  03/27/21 106.8 kg (235 lb 6.4 oz)  03/06/21 104.1 kg (229 lb 6.4 oz)  12/23/20 99.9 kg (220 lb 3.2 oz)   PHYSICAL EXAM: General:  NAD. No resp difficulty HEENT: Normal Neck: Supple. JVP 6-7. Carotids 2+ bilat; no bruits. No lymphadenopathy or thryomegaly appreciated. Cor: PMI nondisplaced. Regular rate & rhythm. No rubs, gallops or murmurs. Lungs: Clear Abdomen: Obese, nontender, nondistended. No hepatosplenomegaly. No bruits or masses. Good bowel sounds. Extremities: No cyanosis, clubbing, rash, edema Neuro: Alert & oriented x 3, cranial nerves grossly intact. Moves all 4 extremities w/o difficulty. Affect pleasant.  ReDs Clip: 35%  ECG: SR 85 bpm (personally reviewed)  ASSESSMENT & PLAN: 1. Chronic Combined Systolic/Diastolic Heart Failure  - Echo (09/2020): EF 40-45% RV normal  - NYHA I-II. Volume mildly up today, ReDs 35%.  - She can increase her lasix to 40 mg daily x 3 days then back to 20 mg daily. - Continue carvedilol 18.75 mg bid.  - Continue Farxiga 10 mg daily. - Continue Enstresto 97/103 mg bid. - Continue spiro 25 mg daily. - Discussed importance of medication adherence, needs to limit fluids to <2L/day, and watch salt intake. - BMET today, repeat in 10 days.  2. DMII - Most recent Hgb A1C 7.2.  - On metformin + Farxiga. - Per PCP.  3. HTN - Better today. - She was given a monitor at last visit and instructed to check BP daily.  - Will need sleep study  when she has insurance.  4. Snoring - Suspected sleep apnea.  - Will need sleep study when able.  5. Obesity - Body mass index is 40.54 kg/m.  - Continue dance classes.  She has been referred to HFSW for assistance with insurance.   Her medications have been optimized. Will refer her to general cardiology to establish care in the next 3-4 months, and we will be happy to see her as needed.  [place] FNP 03/27/21

## 2021-03-27 ENCOUNTER — Ambulatory Visit (HOSPITAL_COMMUNITY)
Admission: RE | Admit: 2021-03-27 | Discharge: 2021-03-27 | Disposition: A | Discharge status: Home / Self Care | Payer: Self-pay | Source: Ambulatory Visit | Attending: Family Medicine | Admitting: Family Medicine

## 2021-03-27 ENCOUNTER — Encounter (HOSPITAL_COMMUNITY): Payer: Self-pay

## 2021-03-27 ENCOUNTER — Other Ambulatory Visit: Payer: Self-pay

## 2021-03-27 VITALS — BP 138/98 | HR 87 | Wt 235.4 lb

## 2021-03-27 DIAGNOSIS — I5042 Chronic combined systolic (congestive) and diastolic (congestive) heart failure: Secondary | ICD-10-CM | POA: Insufficient documentation

## 2021-03-27 DIAGNOSIS — E669 Obesity, unspecified: Secondary | ICD-10-CM | POA: Insufficient documentation

## 2021-03-27 DIAGNOSIS — E119 Type 2 diabetes mellitus without complications: Secondary | ICD-10-CM | POA: Insufficient documentation

## 2021-03-27 DIAGNOSIS — R0683 Snoring: Secondary | ICD-10-CM | POA: Insufficient documentation

## 2021-03-27 DIAGNOSIS — Z6841 Body Mass Index (BMI) 40.0 and over, adult: Secondary | ICD-10-CM | POA: Insufficient documentation

## 2021-03-27 DIAGNOSIS — Z7984 Long term (current) use of oral hypoglycemic drugs: Secondary | ICD-10-CM | POA: Insufficient documentation

## 2021-03-27 DIAGNOSIS — Z833 Family history of diabetes mellitus: Secondary | ICD-10-CM | POA: Insufficient documentation

## 2021-03-27 DIAGNOSIS — I110 Hypertensive heart disease with heart failure: Secondary | ICD-10-CM | POA: Insufficient documentation

## 2021-03-27 DIAGNOSIS — I10 Essential (primary) hypertension: Secondary | ICD-10-CM

## 2021-03-27 DIAGNOSIS — I509 Heart failure, unspecified: Secondary | ICD-10-CM

## 2021-03-27 DIAGNOSIS — Z79899 Other long term (current) drug therapy: Secondary | ICD-10-CM | POA: Insufficient documentation

## 2021-03-27 DIAGNOSIS — Z8249 Family history of ischemic heart disease and other diseases of the circulatory system: Secondary | ICD-10-CM | POA: Insufficient documentation

## 2021-03-27 DIAGNOSIS — E785 Hyperlipidemia, unspecified: Secondary | ICD-10-CM | POA: Insufficient documentation

## 2021-03-27 LAB — BASIC METABOLIC PANEL
Anion gap: 7 (ref 5–15)
BUN: 14 mg/dL (ref 6–20)
CO2: 25 mmol/L (ref 22–32)
Calcium: 8.8 mg/dL — ABNORMAL LOW (ref 8.9–10.3)
Chloride: 105 mmol/L (ref 98–111)
Creatinine, Ser: 0.95 mg/dL (ref 0.44–1.00)
GFR, Estimated: >60 mL/min (ref >60)
Glucose, Bld: 258 mg/dL — ABNORMAL HIGH (ref 70–99)
Potassium: 4 mmol/L (ref 3.5–5.1)
Sodium: 137 mmol/L (ref 135–145)

## 2021-03-27 NOTE — Patient Instructions (Addendum)
INCREASE Lasix to '40mg'$  daily for 3 days, then resume normal dose of 20 mg daily thereafter  Labs today We will only contact you if something comes back abnormal or we need to make some changes. Otherwise no news is good news!  Labs needed in 7-10 days  You have been referred to [facility] Address: [address] [place], [place] [address] Phone: [phone] -they will be in contact with an appointment in 3-4 months  [place]!!!!! IT HAS BEEN A PLEASURE HAVING YOU AS A PATIENT AND WE [place]!!    milAt the Advanced [facility] Failure Clinic, you and your health needs are our priority. As part of our continuing mission to provide you with exceptional heart care, we have created designated Provider Care Teams. These Care Teams include your primary Cardiologist (physician) and Advanced Practice Providers (APPs- Physician Assistants and Nurse Practitioners) who all work together to provide you with the care you need, when you need it.   You may see any of the following providers on your designated Care Team at your next follow up: Dr Glori Bickers Dr Loralie Champagne Dr Patrice Paradise, NP Lyda Jester, [place] Ginnie Smart Audry Riles, PharmD   Please be sure to bring in all your medications bottles to every appointment.

## 2021-03-27 NOTE — Progress Notes (Signed)
ReDS Vest / Clip - 03/27/21 1100       ReDS Vest / Clip   Station Marker B    Ruler Value 37    ReDS Value Range Low volume    ReDS Actual Value 35    Anatomical Comments sitting

## 2021-04-03 ENCOUNTER — Other Ambulatory Visit (HOSPITAL_COMMUNITY): Payer: Self-pay

## 2021-04-03 ENCOUNTER — Other Ambulatory Visit (HOSPITAL_COMMUNITY): Payer: Medicaid Other

## 2021-04-04 ENCOUNTER — Ambulatory Visit (HOSPITAL_COMMUNITY)
Admission: RE | Admit: 2021-04-04 | Discharge: 2021-04-04 | Disposition: A | Discharge status: Home / Self Care | Payer: Medicaid Other | Source: Ambulatory Visit | Attending: Cardiology | Admitting: Cardiology

## 2021-04-04 ENCOUNTER — Other Ambulatory Visit: Payer: Self-pay

## 2021-04-04 ENCOUNTER — Other Ambulatory Visit (HOSPITAL_COMMUNITY): Payer: Self-pay

## 2021-04-04 DIAGNOSIS — I509 Heart failure, unspecified: Secondary | ICD-10-CM

## 2021-04-04 LAB — BASIC METABOLIC PANEL
Anion gap: 8 (ref 5–15)
BUN: 12 mg/dL (ref 6–20)
CO2: 25 mmol/L (ref 22–32)
Calcium: 9.2 mg/dL (ref 8.9–10.3)
Chloride: 105 mmol/L (ref 98–111)
Creatinine, Ser: 0.99 mg/dL (ref 0.44–1.00)
GFR, Estimated: >60 mL/min (ref >60)
Glucose, Bld: 221 mg/dL — ABNORMAL HIGH (ref 70–99)
Potassium: 4.1 mmol/L (ref 3.5–5.1)
Sodium: 138 mmol/L (ref 135–145)

## 2021-04-17 ENCOUNTER — Other Ambulatory Visit (HOSPITAL_COMMUNITY): Payer: Self-pay | Admitting: Adult Health

## 2021-04-17 ENCOUNTER — Other Ambulatory Visit (HOSPITAL_COMMUNITY): Payer: Self-pay

## 2021-04-18 ENCOUNTER — Other Ambulatory Visit (HOSPITAL_COMMUNITY): Payer: Self-pay

## 2021-04-18 ENCOUNTER — Other Ambulatory Visit (HOSPITAL_COMMUNITY): Payer: Self-pay | Admitting: Adult Health

## 2021-04-18 MED ORDER — ATORVASTATIN CALCIUM 20 MG PO TABS
20.0000 mg | ORAL_TABLET | Freq: Every day | ORAL | 3 refills | Status: DC
  Filled 2021-04-18: qty 30, 30d supply, fill #0
  Filled 2021-05-19: qty 30, 30d supply, fill #1
  Filled 2021-07-11: qty 30, 30d supply, fill #2
  Filled 2021-08-25: qty 30, 30d supply, fill #3

## 2021-04-24 ENCOUNTER — Emergency Department (HOSPITAL_COMMUNITY)
Admission: EM | Admit: 2021-04-24 | Discharge: 2021-04-24 | Disposition: A | Discharge status: Home / Self Care | Payer: Medicaid Other | Attending: Emergency Medicine | Admitting: Emergency Medicine

## 2021-04-24 ENCOUNTER — Other Ambulatory Visit: Payer: Self-pay

## 2021-04-24 DIAGNOSIS — E119 Type 2 diabetes mellitus without complications: Secondary | ICD-10-CM | POA: Insufficient documentation

## 2021-04-24 DIAGNOSIS — I509 Heart failure, unspecified: Secondary | ICD-10-CM | POA: Insufficient documentation

## 2021-04-24 DIAGNOSIS — Z79899 Other long term (current) drug therapy: Secondary | ICD-10-CM | POA: Insufficient documentation

## 2021-04-24 DIAGNOSIS — Z7984 Long term (current) use of oral hypoglycemic drugs: Secondary | ICD-10-CM | POA: Insufficient documentation

## 2021-04-24 DIAGNOSIS — K0889 Other specified disorders of teeth and supporting structures: Secondary | ICD-10-CM

## 2021-04-24 DIAGNOSIS — I110 Hypertensive heart disease with heart failure: Secondary | ICD-10-CM | POA: Insufficient documentation

## 2021-04-24 MED ORDER — PENICILLIN V POTASSIUM 250 MG PO TABS
500.0000 mg | ORAL_TABLET | Freq: Once | ORAL | Status: AC
  Administered 2021-04-24: 500 mg via ORAL
  Filled 2021-04-24: qty 2

## 2021-04-24 MED ORDER — ACETAMINOPHEN 500 MG PO TABS
1000.0000 mg | ORAL_TABLET | Freq: Once | ORAL | Status: AC
  Administered 2021-04-24: 1000 mg via ORAL
  Filled 2021-04-24: qty 2

## 2021-04-24 NOTE — Discharge Instructions (Addendum)
Please take entire course of antibiotics as directed.  For pain you can use Tylenol 1000 mg every 6 hours, you can use ibuprofen 400 mg as needed for breakthrough pain.  Make sure you take this medication with food on your stomach.  You will need to follow-up with your dentist for continued management of this.  Return to the emergency department for fevers, swelling or pain under the tongue or in the neck, difficulty breathing or swallowing or any other new or concerning symptoms.  Your blood pressure was elevated today, please follow up with your primary doctor in 1 week for blood pressure recheck.

## 2021-04-24 NOTE — ED Provider Notes (Signed)
[facility] EMERGENCY DEPARTMENT Provider Note   CSN: [id number] Arrival date & time: 04/24/21  1750     History Chief Complaint  Patient presents with   Dental Pain    [name] is a 45 y.o. female.  [name] is a 45 y.o. female with a history of hypertension, CHF, diabetes, who presents to the emergency department for evaluation of dental pain.  She reports for about a week now she has been experiencing right-sided dental pain, it started over her lower gums but now she feels some pain over the upper gums as well.  Has not noted any drainage from this area.  No fevers or chills.  No trouble swallowing but some discomfort with chewing.  No neck pain or swelling, no nausea or vomiting.  Patient reports she has not seen a dentist in over 10 years.  She reports she has tried Orajel and gargling with warm salt water but none of this has improved her symptoms.  Has not tried anything else.  Patient noted to be hypertensive on arrival, denies any associated chest pain, shortness of breath, headaches, visual changes, numbness, weakness.  The history is provided by the patient.      Past Medical History:  Diagnosis Date   CHF (congestive heart failure) ([place])    Diabetes mellitus    HISTORY OF GESTATIONAL DIABETES   Hypertension    IUD    MIRENA INSERTED 01/24/11    Patient Active Problem List   Diagnosis Date Noted   Iron deficiency anemia 11/24/2020   Exertional dyspnea 10/04/2020   Hypertensive urgency 10/04/2020   Left lumbar radiculitis 03/23/2019   Vitamin D deficiency disease 06/04/2018   Routine general medical examination at a health care facility 05/15/2017   Cervical cancer screening 05/15/2017   Dyslipidemia, goal LDL below 100 04/04/2017   Snoring 10/25/2016   Obesity (BMI 30-39.9) 03/09/2014   Diuretic-induced hypokalemia 02/13/2013   Diabetes mellitus without complication ([place]) 0000000   Malignant hypertension 06/01/2009     Past Surgical History:  Procedure Laterality Date   CESAREAN SECTION  2006     OB History     Gravida  2   Para      Term      Preterm      AB  1   Living  2      SAB      IAB      Ectopic      Multiple      Live Births              Family History  Problem Relation Age of Onset   Diabetes Mother    Heart disease Mother    Hypertension Maternal Aunt    Breast cancer Neg Hx     Social History   Tobacco Use   Smoking status: Never   Smokeless tobacco: Never  Vaping Use   Vaping Use: Never used  Substance Use Topics   Alcohol use: Yes   Drug use: No    Home Medications Prior to Admission medications   Medication Sig Start Date End Date Taking? Authorizing Provider  atorvastatin (LIPITOR) 20 MG tablet TAKE 1 TABLET (20 MG TOTAL) BY MOUTH DAILY. 04/18/21   Larey Dresser, MD  carvedilol (COREG) 12.5 MG tablet Take 1.5 tablets (18.75 mg total) by mouth 2 (two) times daily with a meal. 12/08/20   Clegg, Amy D, NP  dapagliflozin propanediol (FARXIGA) 10 MG TABS tablet Take  1 tablet (10 mg total) by mouth daily before breakfast. 12/23/20   Camillia Herter, NP  furosemide (LASIX) 20 MG tablet Take 1 tablet (20 mg total) by mouth daily. 03/06/21   [place], Maricela Bo, FNP  Iron, Ferrous Sulfate, 325 (65 Fe) MG TABS Take 325 mg by mouth daily. 12/23/20   Camillia Herter, NP  levonorgestrel (MIRENA) 20 MCG/24HR IUD 1 Intra Uterine Device (1 each total) by Intrauterine route once. 01/04/11   Janith Lima, MD  metFORMIN (GLUCOPHAGE-XR) 500 MG 24 hr tablet TAKE 3 TABLETS (1,500 MG TOTAL) BY MOUTH EVERY MORNING. 12/23/20   Minette Brine, Amy J, NP  sacubitril-valsartan (ENTRESTO) 97-103 MG TAKE 1 TABLET BY MOUTH TWICE DAILY. 12/06/20   Clegg, Amy D, NP  spironolactone (ALDACTONE) 25 MG tablet TAKE 1 TABLET (25 MG TOTAL) BY MOUTH AT BEDTIME. 12/06/20   Clegg, Amy D, NP    Allergies    Patient has no known allergies.  Review of Systems   Review of Systems   Constitutional:  Negative for chills and fever.  HENT:  Positive for dental problem. Negative for facial swelling.   Gastrointestinal:  Negative for nausea and vomiting.  Skin:  Negative for color change and rash.  All other systems reviewed and are negative.  Physical Exam Updated Vital Signs BP (!) 201/137 (BP Location: Left Arm)   Pulse 85   Temp 99.1 F (37.3 C) (Oral)   Resp 18   Ht '5\' 5"'$  (1.651 m)   Wt 103.9 kg   SpO2 100%   BMI 38.11 kg/m   Physical Exam Vitals and nursing note reviewed.  Constitutional:      General: She is not in acute distress.    Appearance: Normal appearance. She is well-developed. She is not ill-appearing or diaphoretic.  HENT:     Head: Normocephalic and atraumatic.     Mouth/Throat:     Comments: Tenderness over the right lower gums, minimal tenderness over the upper gums.  No obvious drainable abscess, no tenderness or swelling beneath the tongue, posterior oropharynx is clear, tolerating secretions, normal phonation Eyes:     General:        Right eye: No discharge.        Left eye: No discharge.  Pulmonary:     Effort: Pulmonary effort is normal. No respiratory distress.  Musculoskeletal:     Cervical back: Neck supple. No rigidity or tenderness.  Skin:    General: Skin is warm and dry.  Neurological:     Mental Status: She is alert and oriented to person, place, and time.     Coordination: Coordination normal.  Psychiatric:        Mood and Affect: Mood normal.        Behavior: Behavior normal.    ED Results / Procedures / Treatments   Labs (all labs ordered are listed, but only abnormal results are displayed) Labs Reviewed - No data to display  EKG None  Radiology No results found.  Procedures Procedures   Medications Ordered in ED Medications  acetaminophen (TYLENOL) tablet 1,000 mg (1,000 mg Oral Given 04/24/21 2013)  penicillin v potassium (VEETID) tablet 500 mg (500 mg Oral Given 04/24/21 2013)    ED Course  I  have reviewed the triage vital signs and the nursing notes.  Pertinent labs & imaging results that were available during my care of the patient were reviewed by me and considered in my medical decision making (see chart for details).  MDM Rules/Calculators/A&P                           Patient with toothache.  No gross abscess.  Exam unconcerning for Ludwig's angina or spread of infection.  Will treat with penicillin and anti-inflammatories medicine.  Urged patient to follow-up with dentist.  Dental resources provided.  Final Clinical Impression(s) / ED Diagnoses Final diagnoses:  Pain, dental    Rx / DC Orders ED Discharge Orders     None        Janet Berlin 04/24/21 2042    Sherwood Gambler, MD 04/25/21 [phone]

## 2021-04-24 NOTE — ED Triage Notes (Signed)
Pt c/o dental pain for the past week on the right side of her mouth.

## 2021-04-24 NOTE — ED Provider Notes (Signed)
Emergency Medicine Provider Triage Evaluation Note  [name] , a 45 y.o. female  was evaluated in triage.  Pt complains of dental pain on the right side she reports upper and lower.  Able to swallow.   Last time saw a dentist was about 10 years ago.    Review of Systems  Positive: Jaw pain Negative: Fevers  Physical Exam  BP (!) 201/137 (BP Location: Left Arm)   Pulse 85   Temp 99.1 F (37.3 C) (Oral)   Resp 18   Ht '5\' 5"'$  (1.651 m)   Wt 103.9 kg   SpO2 100%   BMI 38.11 kg/m  Gen:   Awake, no distress  Resp:  Normal effort  MSK:   Moves extremities without difficulty  Other:  No intraoral edema.   Medical Decision Making  Medically screening exam initiated at 7:13 PM.  Appropriate orders placed.  [name] was informed that the remainder of the evaluation will be completed by another provider, this initial triage assessment does not replace that evaluation, and the importance of remaining in the ED until their evaluation is complete.  Note: Portions of this report may have been transcribed using voice recognition software. Every effort was made to ensure accuracy; however, inadvertent computerized transcription errors may be present    Ollen Gross 04/24/21 1916    Dorie Rank, MD 04/25/21 2124

## 2021-04-25 ENCOUNTER — Telehealth (HOSPITAL_COMMUNITY): Payer: Self-pay | Admitting: *Deleted

## 2021-04-25 NOTE — Telephone Encounter (Signed)
Pt left vm stating she went to the ED for dental pain. They gave her penicillin and tylenol while she was there but did not give her a script to take home due to elevated bp 183/102.  She was told to contact our office for medication since she does not have a Pharmacist, community.    Routed to [employer]

## 2021-04-28 ENCOUNTER — Other Ambulatory Visit (HOSPITAL_COMMUNITY): Payer: Self-pay

## 2021-05-16 ENCOUNTER — Other Ambulatory Visit: Payer: Self-pay | Admitting: Family

## 2021-05-16 DIAGNOSIS — E119 Type 2 diabetes mellitus without complications: Secondary | ICD-10-CM

## 2021-05-17 ENCOUNTER — Other Ambulatory Visit (HOSPITAL_COMMUNITY): Payer: Self-pay

## 2021-05-19 ENCOUNTER — Other Ambulatory Visit (HOSPITAL_COMMUNITY): Payer: Self-pay

## 2021-05-25 ENCOUNTER — Other Ambulatory Visit (HOSPITAL_COMMUNITY): Payer: Self-pay

## 2021-05-25 MED ORDER — METFORMIN HCL ER 500 MG PO TB24
1500.0000 mg | ORAL_TABLET | Freq: Every morning | ORAL | 2 refills | Status: DC
  Filled 2021-05-25: qty 90, 30d supply, fill #0
  Filled 2021-07-11: qty 90, 30d supply, fill #1
  Filled 2021-08-25: qty 90, 30d supply, fill #2

## 2021-06-06 ENCOUNTER — Other Ambulatory Visit (HOSPITAL_COMMUNITY): Payer: Self-pay

## 2021-06-06 ENCOUNTER — Other Ambulatory Visit (HOSPITAL_COMMUNITY): Payer: Self-pay | Admitting: Family Medicine

## 2021-06-06 ENCOUNTER — Ambulatory Visit: Payer: Self-pay | Admitting: Internal Medicine

## 2021-06-06 ENCOUNTER — Other Ambulatory Visit (HOSPITAL_COMMUNITY): Payer: Self-pay | Admitting: Adult Health

## 2021-06-06 MED ORDER — FUROSEMIDE 20 MG PO TABS
20.0000 mg | ORAL_TABLET | Freq: Every day | ORAL | 2 refills | Status: DC
  Filled 2021-06-06: qty 30, 30d supply, fill #0
  Filled 2021-07-11: qty 30, 30d supply, fill #1
  Filled 2021-08-25: qty 30, 30d supply, fill #2

## 2021-06-06 MED ORDER — SPIRONOLACTONE 25 MG PO TABS
25.0000 mg | ORAL_TABLET | Freq: Every day | ORAL | 3 refills | Status: DC
  Filled 2021-06-06: qty 30, 30d supply, fill #0
  Filled 2021-07-11: qty 30, 30d supply, fill #1
  Filled 2021-08-29: qty 30, 30d supply, fill #2
  Filled 2021-10-02: qty 30, 30d supply, fill #3

## 2021-06-14 ENCOUNTER — Telehealth (HOSPITAL_COMMUNITY): Payer: Self-pay | Admitting: Licensed Clinical Social Worker

## 2021-06-14 NOTE — Telephone Encounter (Signed)
CSW reaching out to patient because they are currently receiving medications through [facility].  CSW called to discuss if pt is eligible for any alternative coverage options.  Unable to reach- left VM requesting return call.  CSW will continue to follow to assist as needed.  Ludie Hudon H. Ever Gustafson, LCSW Clinical Social Worker [facility] Desk#: [phone] Cell#: [phone]  

## 2021-06-19 ENCOUNTER — Telehealth (HOSPITAL_COMMUNITY): Payer: Self-pay | Admitting: Licensed Clinical Social Worker

## 2021-06-19 NOTE — Telephone Encounter (Signed)
CSW reaching out to patient because they are currently receiving medications through [facility].  CSW called to discuss if pt is eligible for any alternative coverage options.  Pt working at [employer] ex but unsure when open enrollment is- CSW encouraged her to follow up as a lot of companies have open enrollment at this time- pt will inquire with her job.  CSW also discussed getting help with [facility] bills- had attempted to apply for CAFA before but was denied because she did not include all required documents- CSW mailing new CAFA for pt to complete.  No further needs at this time  CSW will continue to follow to assist as needed.  Jorge Ny, LCSW Clinical Social Worker [facility] Desk#: [phone] Cell#: [phone]

## 2021-07-12 ENCOUNTER — Other Ambulatory Visit (HOSPITAL_COMMUNITY): Payer: Self-pay

## 2021-07-24 ENCOUNTER — Other Ambulatory Visit (HOSPITAL_COMMUNITY): Payer: Self-pay | Admitting: *Deleted

## 2021-07-24 ENCOUNTER — Other Ambulatory Visit (HOSPITAL_COMMUNITY): Payer: Self-pay

## 2021-07-24 DIAGNOSIS — E119 Type 2 diabetes mellitus without complications: Secondary | ICD-10-CM

## 2021-07-24 MED ORDER — DAPAGLIFLOZIN PROPANEDIOL 10 MG PO TABS
10.0000 mg | ORAL_TABLET | Freq: Every day | ORAL | 3 refills | Status: DC
  Filled 2021-07-24: qty 30, 30d supply, fill #0

## 2021-08-15 ENCOUNTER — Other Ambulatory Visit (HOSPITAL_COMMUNITY): Payer: Self-pay

## 2021-08-15 ENCOUNTER — Other Ambulatory Visit (HOSPITAL_COMMUNITY): Payer: Self-pay | Admitting: Adult Health

## 2021-08-15 MED ORDER — CARVEDILOL 12.5 MG PO TABS
18.7500 mg | ORAL_TABLET | Freq: Two times a day (BID) | ORAL | 3 refills | Status: DC
  Filled 2021-08-15: qty 90, 30d supply, fill #0
  Filled 2021-10-23: qty 90, 30d supply, fill #1
  Filled 2021-12-25: qty 90, 30d supply, fill #2

## 2021-08-25 ENCOUNTER — Other Ambulatory Visit (HOSPITAL_COMMUNITY): Payer: Self-pay

## 2021-08-30 ENCOUNTER — Other Ambulatory Visit (HOSPITAL_COMMUNITY): Payer: Self-pay

## 2021-10-02 ENCOUNTER — Other Ambulatory Visit (HOSPITAL_COMMUNITY): Payer: Self-pay | Admitting: Family Medicine

## 2021-10-02 ENCOUNTER — Telehealth (HOSPITAL_COMMUNITY): Payer: Self-pay | Admitting: Licensed Clinical Social Worker

## 2021-10-02 ENCOUNTER — Other Ambulatory Visit (HOSPITAL_COMMUNITY): Payer: Self-pay | Admitting: Cardiology

## 2021-10-02 ENCOUNTER — Other Ambulatory Visit: Payer: Self-pay | Admitting: Family

## 2021-10-02 ENCOUNTER — Other Ambulatory Visit (HOSPITAL_COMMUNITY): Payer: Self-pay

## 2021-10-02 DIAGNOSIS — I509 Heart failure, unspecified: Secondary | ICD-10-CM

## 2021-10-02 DIAGNOSIS — E785 Hyperlipidemia, unspecified: Secondary | ICD-10-CM

## 2021-10-02 DIAGNOSIS — I10 Essential (primary) hypertension: Secondary | ICD-10-CM

## 2021-10-02 DIAGNOSIS — E119 Type 2 diabetes mellitus without complications: Secondary | ICD-10-CM

## 2021-10-02 MED ORDER — FUROSEMIDE 20 MG PO TABS
20.0000 mg | ORAL_TABLET | Freq: Every day | ORAL | 2 refills | Status: DC
  Filled 2021-10-02: qty 30, 30d supply, fill #0
  Filled 2021-11-08: qty 30, 30d supply, fill #1
  Filled 2021-12-15: qty 30, 30d supply, fill #2

## 2021-10-02 MED ORDER — ATORVASTATIN CALCIUM 20 MG PO TABS
20.0000 mg | ORAL_TABLET | Freq: Every day | ORAL | 2 refills | Status: DC
  Filled 2021-10-02: qty 30, 30d supply, fill #0
  Filled 2021-11-08: qty 30, 30d supply, fill #1
  Filled 2021-12-15: qty 30, 30d supply, fill #2

## 2021-10-02 MED ORDER — ATORVASTATIN CALCIUM 20 MG PO TABS
20.0000 mg | ORAL_TABLET | Freq: Every day | ORAL | 3 refills | Status: DC
  Filled 2021-10-02: qty 30, 30d supply, fill #0

## 2021-10-02 MED ORDER — FUROSEMIDE 20 MG PO TABS
20.0000 mg | ORAL_TABLET | Freq: Every day | ORAL | 3 refills | Status: DC
  Filled 2021-10-02: qty 30, 30d supply, fill #0

## 2021-10-02 NOTE — Addendum Note (Signed)
Addended by: Scarlette Calico on: 10/02/2021 04:02 PM   Modules accepted: Orders

## 2021-10-02 NOTE — Telephone Encounter (Signed)
Pt has not been seen in our office. Pt has been seen in the CHF clinic, in August 2022. Please reorder pt's medication until then. Pt still has not set up an appointment. Please address

## 2021-10-02 NOTE — Telephone Encounter (Signed)
CSW consulted to follow up with pt regarding insurance status.    Pt reports that she is still without insurance and unable to get through her job at this time.  CSW explained that she is well enough to be seen at a different clinic at this time but that we want to ensure she has a way to get her medications safely.  Informed her of [place] Medassist program which should cover all of her medications.  Mailed her an application.  Also sent her information about applying for [facility] insurance.  Will continue to follow and assist as needed  Jorge Ny, [facility] Desk#: [phone] Cell#: [phone]

## 2021-10-02 NOTE — Telephone Encounter (Signed)
Schedule appointment?

## 2021-10-03 ENCOUNTER — Other Ambulatory Visit (HOSPITAL_COMMUNITY): Payer: Self-pay

## 2021-10-12 ENCOUNTER — Other Ambulatory Visit (HOSPITAL_COMMUNITY): Payer: Self-pay

## 2021-10-12 ENCOUNTER — Telehealth (HOSPITAL_COMMUNITY): Payer: Self-pay | Admitting: Pharmacy Technician

## 2021-10-12 NOTE — Telephone Encounter (Signed)
Advanced Heart Failure Patient Advocate Encounter  Received a notice from [employer] that it is time to renew [place] assistance. Let voicemail for patient to call back and start the re-enrollment process.   Charlann Boxer, CPhT

## 2021-10-23 ENCOUNTER — Other Ambulatory Visit (HOSPITAL_COMMUNITY): Payer: Self-pay

## 2021-11-01 IMAGING — CT CT HEAD W/O CM
3 series · 15 of 47 positions shown, 18 images · non-contrast
Comparison: None.

CLINICAL DATA: Headache, dyspnea on exertion, left arm and leg
tingling for 4 days, hypertension, diabetes

EXAM:
CT HEAD WITHOUT CONTRAST
TECHNIQUE: Contiguous axial images were obtained from the base of the skull
through the vertex without intravenous contrast.

[Series 3: head 5.0 h30s · axial · 0.42mm/px · z∈[-84,+41]mm · 9 of 31 slices shown, 12 images]
[im 3/31  brain]
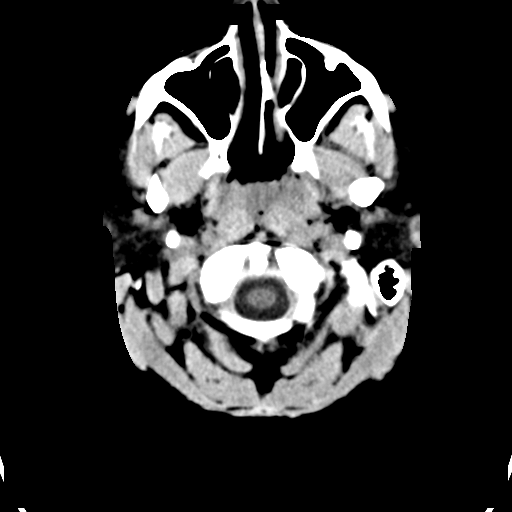
[im 3/31  bone]
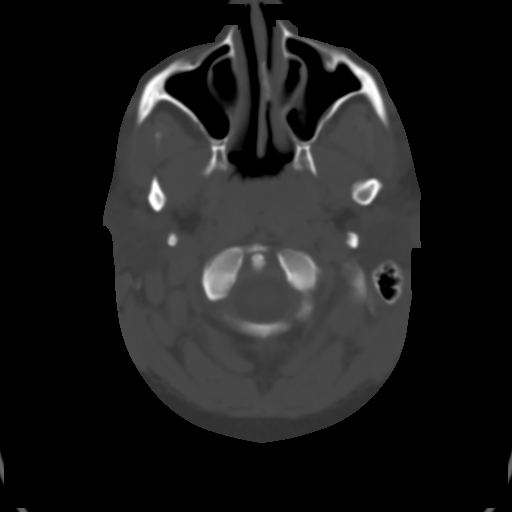
[im 6/31  brain]
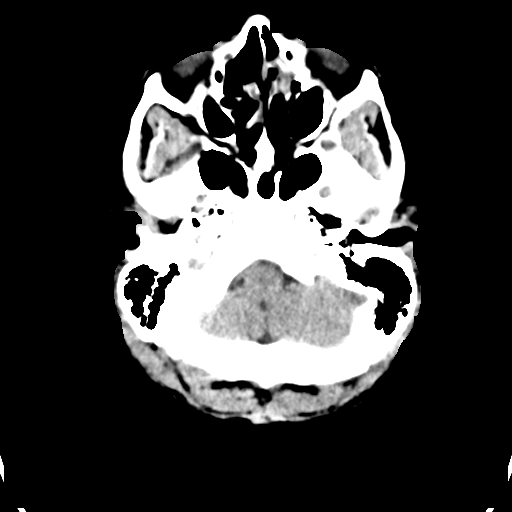
[im 9/31  brain]
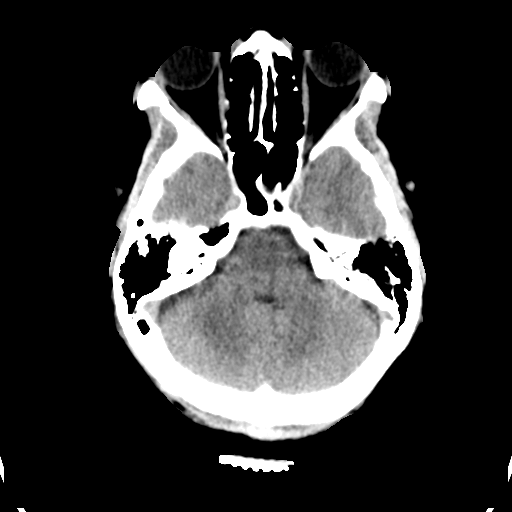
[im 12/31  brain]
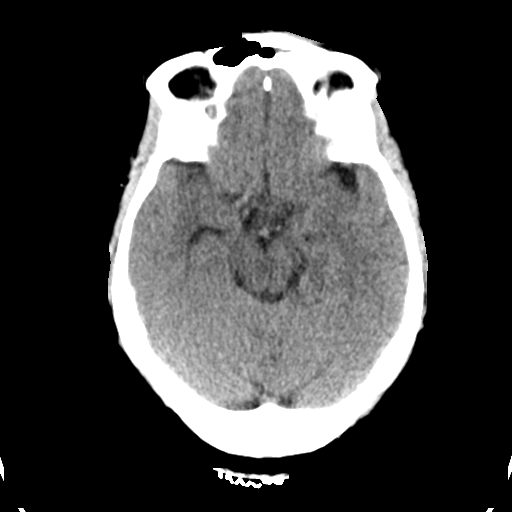
[im 16/31  brain]
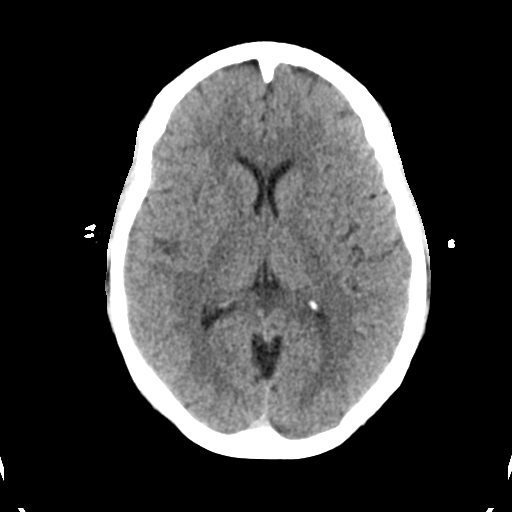
[im 16/31  bone]
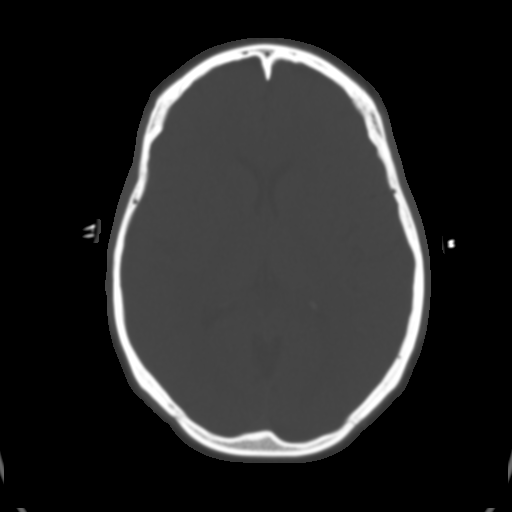
[im 19/31  brain]
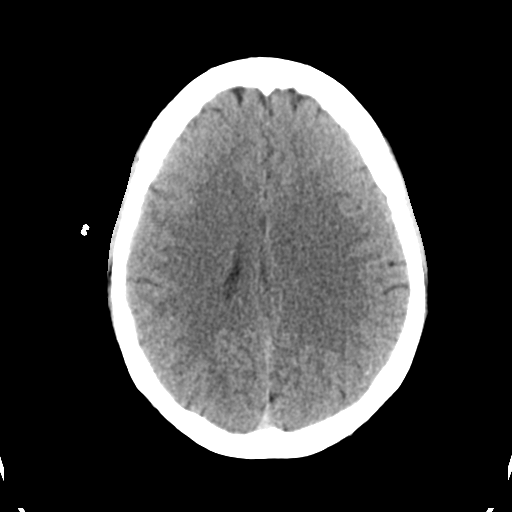
[im 22/31  brain]
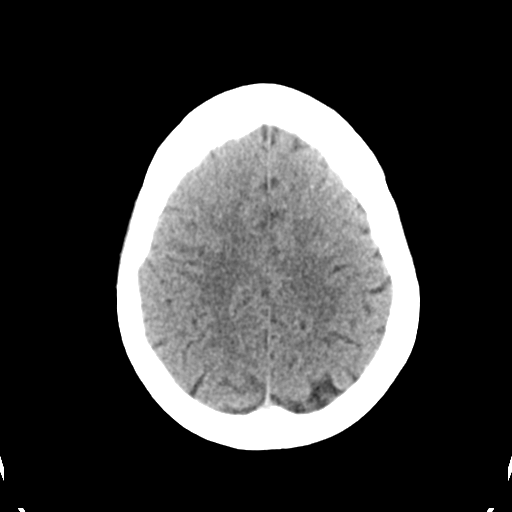
[im 25/31  brain]
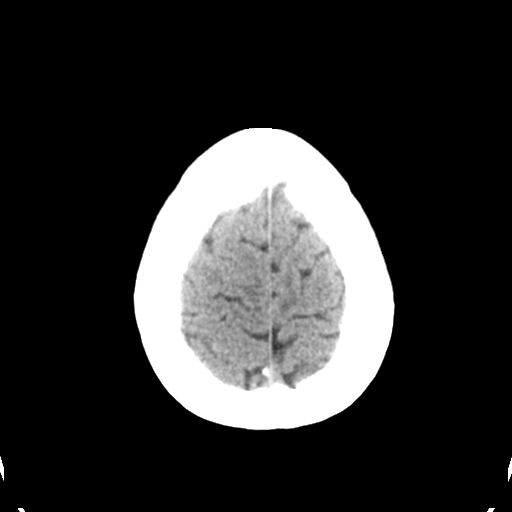
[im 28/31  brain]
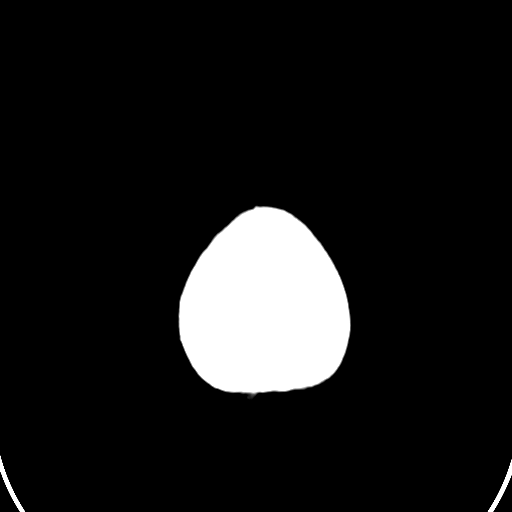
[im 28/31  bone]
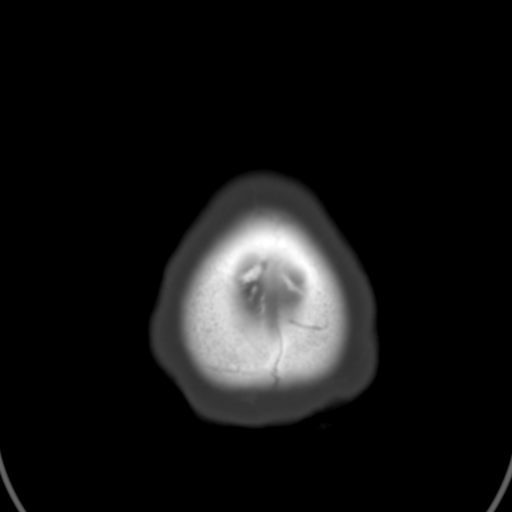

[Series 5: head 3.0 mpr cor · coronal · 0.30mm/px · 3 of 66 slices shown]
[im 22/66  brain]
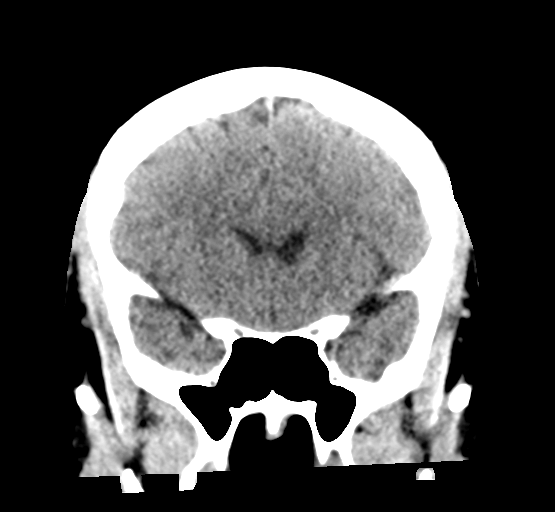
[im 29/66  brain]
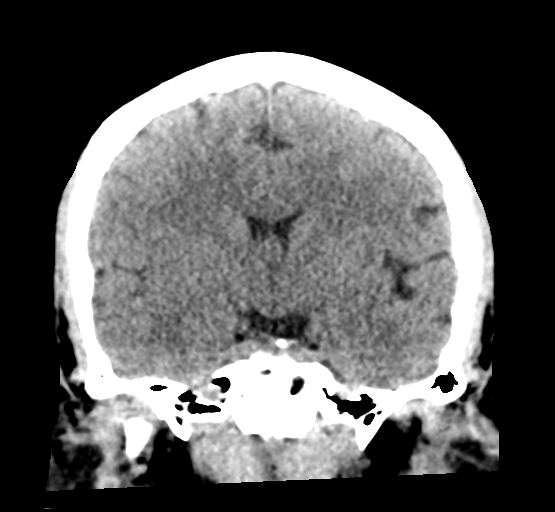
[im 37/66  brain]
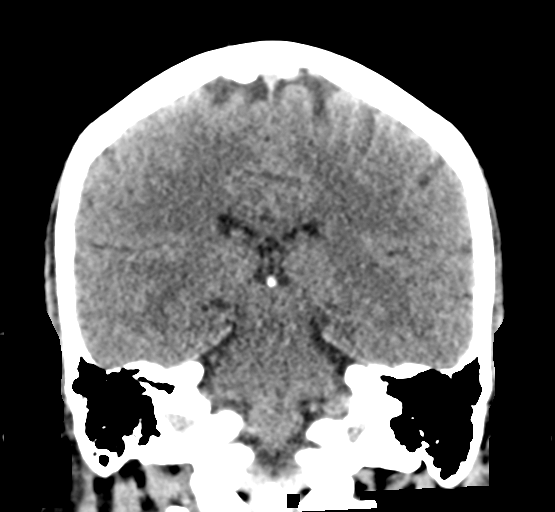

[Series 6: head 3.0 mpr sag · sagittal · 0.29mm/px · 3 of 57 slices shown]
[im 19/57  brain]
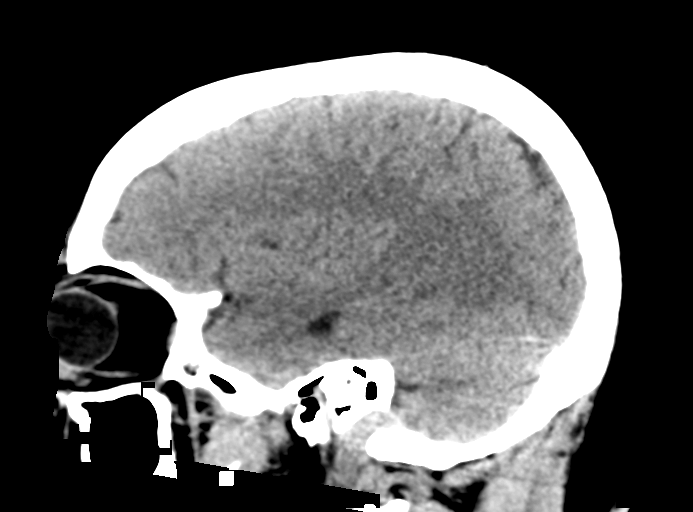
[im 29/57  brain]
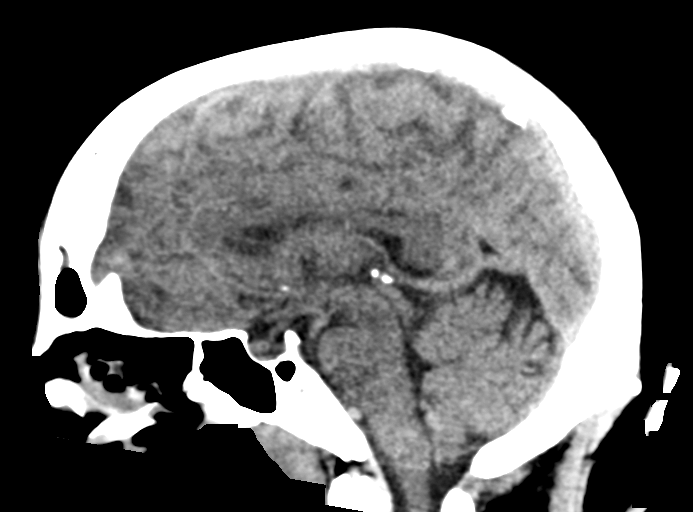
[im 38/57  brain]
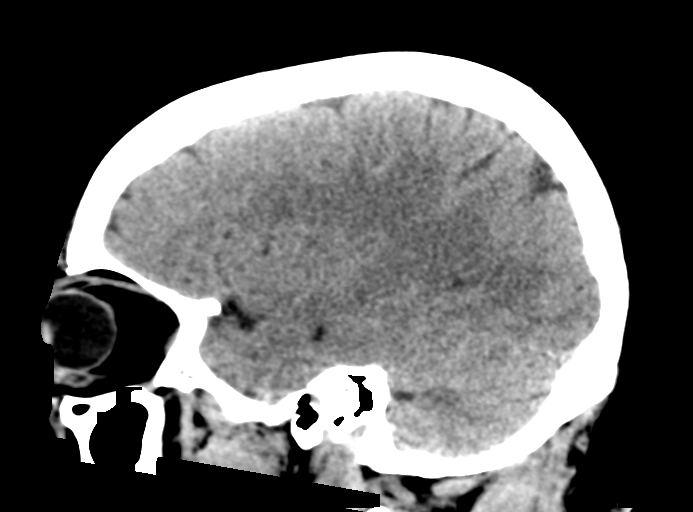

[15 of 47 positions shown; findings below may reference images not displayed]

FINDINGS: Brain: No acute infarct or hemorrhage. Lateral ventricles and
midline structures are unremarkable. No acute extra-axial fluid
collections. No mass effect.

Vascular: No hyperdense vessel or unexpected calcification.

Skull: Normal. Negative for fracture or focal lesion.

Sinuses/Orbits: Mucoperiosteal thickening within the maxillary and
ethmoid sinuses. Partial opacification left frontal sinus.

Other: None.
IMPRESSION: 1. Mild paranasal sinus disease.
2. No acute intracranial process.

## 2021-11-06 ENCOUNTER — Telehealth (HOSPITAL_COMMUNITY): Payer: Self-pay | Admitting: Licensed Clinical Social Worker

## 2021-11-06 NOTE — Telephone Encounter (Signed)
CSW had VM from pt requesting f/up with CSW regarding [place] application. ? ?CSW attempted to call pt to follow up- unable to reach- left VM ? ?Jorge Ny, LCSW ?Clinical Social Worker ?[facility] Failure Clinic ?Desk#: [phone] ?Cell#: [phone] ? ?

## 2021-11-08 ENCOUNTER — Other Ambulatory Visit (HOSPITAL_COMMUNITY): Payer: Self-pay

## 2021-11-08 ENCOUNTER — Telehealth (HOSPITAL_COMMUNITY): Payer: Self-pay | Admitting: Licensed Clinical Social Worker

## 2021-11-08 ENCOUNTER — Other Ambulatory Visit (HOSPITAL_COMMUNITY): Payer: Self-pay | Admitting: Adult Health

## 2021-11-08 NOTE — Telephone Encounter (Signed)
Pt called CSW back and CSW helped clarify requirements for [place] Med[employer]ist application. ? ?Pt will plan to bring in application next week for [place] to review and get submitted ? ?Jorge Ny, LCSW ?Clinical Social Worker ?[facility] ?Desk#: [phone] ?Cell#: [phone] ? ?

## 2021-11-14 ENCOUNTER — Other Ambulatory Visit (HOSPITAL_COMMUNITY): Payer: Self-pay

## 2021-12-15 ENCOUNTER — Other Ambulatory Visit (HOSPITAL_COMMUNITY): Payer: Self-pay

## 2021-12-25 ENCOUNTER — Other Ambulatory Visit (HOSPITAL_COMMUNITY): Payer: Self-pay

## 2021-12-29 ENCOUNTER — Other Ambulatory Visit (HOSPITAL_COMMUNITY): Payer: Self-pay

## 2021-12-29 ENCOUNTER — Telehealth (HOSPITAL_COMMUNITY): Payer: Self-pay | Admitting: Licensed Clinical Social Worker

## 2021-12-29 DIAGNOSIS — E119 Type 2 diabetes mellitus without complications: Secondary | ICD-10-CM

## 2021-12-29 MED ORDER — DAPAGLIFLOZIN PROPANEDIOL 10 MG PO TABS: 10.0000 mg | ORAL_TABLET | Freq: Every day | ORAL | 3 refills | Status: DC

## 2021-12-29 MED ORDER — CARVEDILOL 12.5 MG PO TABS
18.7500 mg | ORAL_TABLET | Freq: Two times a day (BID) | ORAL | 3 refills | Status: DC
Start: 2021-12-29 — End: 2022-05-21

## 2021-12-29 MED ORDER — FUROSEMIDE 20 MG PO TABS: 20.0000 mg | ORAL_TABLET | Freq: Every day | ORAL | 3 refills | Status: DC

## 2021-12-29 MED ORDER — ATORVASTATIN CALCIUM 20 MG PO TABS: 20.0000 mg | ORAL_TABLET | Freq: Every day | ORAL | 3 refills | Status: DC

## 2021-12-29 MED ORDER — SACUBITRIL-VALSARTAN 97-103 MG PO TABS: 1.0000 | ORAL_TABLET | Freq: Two times a day (BID) | ORAL | 3 refills | Status: DC

## 2021-12-29 MED ORDER — SPIRONOLACTONE 25 MG PO TABS: 25.0000 mg | ORAL_TABLET | Freq: Every day | ORAL | 3 refills | Status: DC

## 2021-12-29 NOTE — Telephone Encounter (Signed)
CSW send in [facility] Med[employer]ist application for review- awaiting determination ? ?Jorge Ny, LCSW ?Clinical Social Worker ?[facility] Clinic ?Desk#: [phone] ?Cell#: [phone] ? ?

## 2022-01-31 NOTE — Progress Notes (Signed)
Patient ID: [name], female    DOB: March 24, 1976  MRN: [id number]  CC: Diabetes Follow-Up  Subjective: [name] is a 46 y.o. female who presents for diabetes follow-up.   Her concerns today include:  Reports 2 months without Metformin due to needing refills. [name] recently refilled 12/29/2021 for 1 year supply at the [facility].   Current Outpatient Medications on File Prior to Visit  Medication Sig Dispense Refill   atorvastatin (LIPITOR) 20 MG tablet Take 1 tablet (20 mg total) by mouth daily. 90 tablet 3   carvedilol (COREG) 12.5 MG tablet Take 1.5 tablets (18.75 mg total) by mouth 2 (two) times daily with a meal. 180 tablet 3   dapagliflozin propanediol (FARXIGA) 10 MG TABS tablet Take 1 tablet (10 mg total) by mouth daily before breakfast. 90 tablet 3   furosemide (LASIX) 20 MG tablet Take 1 tablet (20 mg total) by mouth daily. 90 tablet 3   Iron, Ferrous Sulfate, 325 (65 Fe) MG TABS Take 325 mg by mouth daily. 90 tablet 0   levonorgestrel (MIRENA) 20 MCG/24HR IUD 1 Intra Uterine Device (1 each total) by Intrauterine route once. 1 each 0   sacubitril-valsartan (ENTRESTO) 97-103 MG TAKE 1 TABLET BY MOUTH TWICE DAILY. 180 tablet 3   spironolactone (ALDACTONE) 25 MG tablet Take 1 tablet (25 mg total) by mouth at bedtime. 90 tablet 3   No current facility-administered medications on file prior to visit.    Patient Active Problem List   Diagnosis Date Noted   Iron deficiency anemia 11/24/2020   Exertional dyspnea 10/04/2020   Hypertensive urgency 10/04/2020   Left lumbar radiculitis 03/23/2019   Vitamin D deficiency disease 06/04/2018   Routine general medical examination at a health care facility 05/15/2017   Cervical cancer screening 05/15/2017   Dyslipidemia, goal LDL below 100 04/04/2017   Snoring 10/25/2016   Obesity (BMI 30-39.9) 03/09/2014   Diuretic-induced hypokalemia 02/13/2013   Diabetes mellitus without complication ([place]) 81/44/8185   Malignant  hypertension 06/01/2009     Current Outpatient Medications on File Prior to Visit  Medication Sig Dispense Refill   atorvastatin (LIPITOR) 20 MG tablet Take 1 tablet (20 mg total) by mouth daily. 90 tablet 3   carvedilol (COREG) 12.5 MG tablet Take 1.5 tablets (18.75 mg total) by mouth 2 (two) times daily with a meal. 180 tablet 3   dapagliflozin propanediol (FARXIGA) 10 MG TABS tablet Take 1 tablet (10 mg total) by mouth daily before breakfast. 90 tablet 3   furosemide (LASIX) 20 MG tablet Take 1 tablet (20 mg total) by mouth daily. 90 tablet 3   Iron, Ferrous Sulfate, 325 (65 Fe) MG TABS Take 325 mg by mouth daily. 90 tablet 0   levonorgestrel (MIRENA) 20 MCG/24HR IUD 1 Intra Uterine Device (1 each total) by Intrauterine route once. 1 each 0   sacubitril-valsartan (ENTRESTO) 97-103 MG TAKE 1 TABLET BY MOUTH TWICE DAILY. 180 tablet 3   spironolactone (ALDACTONE) 25 MG tablet Take 1 tablet (25 mg total) by mouth at bedtime. 90 tablet 3   No current facility-administered medications on file prior to visit.    No Known Allergies  Social History   Socioeconomic History   Marital status: Single    Spouse name: Not on file   Number of children: 2   Years of education: Not on file   Highest education level: Not on file  Occupational History    Employer: [employer]  Tobacco Use   Smoking status: Never  Passive exposure: Never   Smokeless tobacco: Never  Vaping Use   Vaping Use: Never used  Substance and Sexual Activity   Alcohol use: Yes   Drug use: No   Sexual activity: Yes    Birth control/protection: I.U.D.    Comment: Mirena IUD inserted 2012?,intercourse age 49, more than 5 sexual partners  Other Topics Concern   Not on file  Social History Narrative   Not on file   Social Determinants of Health   Financial Resource Strain: Medium Risk (10/05/2020)   Overall Financial Resource Strain (CARDIA)    Difficulty of Paying Living Expenses: Somewhat hard  Food Insecurity: No Food  Insecurity (12/05/2020)   Hunger Vital Sign    Worried About Running Out of Food in the Last Year: Never true    Ran Out of Food in the Last Year: Never true  Transportation Needs: No Transportation Needs (12/05/2020)   PRAPARE - Hydrologist (Medical): No    Lack of Transportation (Non-Medical): No  Physical Activity: Sufficiently Active (10/05/2020)   Exercise Vital Sign    Days of Exercise per Week: 3 days    Minutes of Exercise per Session: 120 Alexis  Stress: Not on file  Social Connections: Unknown (10/05/2020)   Social Connection and Isolation Panel [NHANES]    Frequency of Communication with Friends and Family: Three times a week    Frequency of Social Gatherings with Friends and Family: Not on file    Attends Religious Services: Not on file    Active Member of Clubs or Organizations: Not on file    Attends Archivist Meetings: 1 to 4 times per year    Marital Status: Not on file  Intimate Partner Violence: Not on file    Family History  Problem Relation Age of Onset   Diabetes Mother    Heart disease Mother    Hypertension Maternal Aunt    Breast cancer Neg Hx     Past Surgical History:  Procedure Laterality Date   CESAREAN SECTION  2006    ROS: Review of Systems Negative except as stated above  PHYSICAL EXAM: BP (!) 158/93 (BP Location: Left Arm, Patient Position: Sitting, Cuff Size: Normal)   Pulse 78   Temp 98.3 F (36.8 C)   Resp 16   Wt 231 lb (104.8 kg)   BMI 38.44 kg/m   Physical Exam HENT:     Head: Normocephalic and atraumatic.  Eyes:     Extraocular Movements: Extraocular movements intact.     Conjunctiva/sclera: Conjunctivae normal.     Pupils: Pupils are equal, round, and reactive to light.  Cardiovascular:     Rate and Rhythm: Normal rate and regular rhythm.     Pulses: Normal pulses.  Pulmonary:     Effort: Pulmonary effort is normal.     Breath sounds: Normal breath sounds.  Musculoskeletal:      Cervical back: Normal range of motion and neck supple.  Neurological:     General: No focal deficit present.     Mental Status: She is alert and oriented to person, place, and time.  Psychiatric:        Mood and Affect: Mood normal.        Behavior: Behavior normal.    Results for orders placed or performed in visit on 02/08/22  POCT glycosylated hemoglobin (Hb A1C)  Result Value Ref Range   Hemoglobin A1C 8.0 (A) 4.0 - 5.6 %   HbA1c POC (<>  result, manual entry)     HbA1c, POC (prediabetic range)     HbA1c, POC (controlled diabetic range)      ASSESSMENT AND PLAN: 1. Type 2 diabetes mellitus without complication, without long-term current use of insulin (HCC) - Hemoglobin A1c today not at goal at 8.0%, goal < 7.0%. - Resume Metformin as prescribed.  - Continue Dapagliflozin Propanediol as prescribed. Managed by Cardiology. No refills needed as of present. - Discussed the importance of healthy eating habits, low-carbohydrate diet, low-sugar diet, regular aerobic exercise (at least 150 minutes a week as tolerated) and medication compliance to achieve or maintain control of diabetes. - Will have patient return to recheck hemoglobin A1c in 4 weeks or sooner if needed.  - POCT glycosylated hemoglobin (Hb A1C) - metFORMIN (GLUCOPHAGE-XR) 500 MG 24 hr tablet; Take 3 tablets (1,500 mg total) by mouth in the morning.  Dispense: 90 tablet; Refill: 3 - Hemoglobin A1c; Future    Patient was given the opportunity to ask questions.  Patient verbalized understanding of the plan and was able to repeat key elements of the plan. Patient was given clear instructions to go to Emergency Department or return to medical center if symptoms don't improve, worsen, or new problems develop.The patient verbalized understanding.   Orders Placed This Encounter  Procedures   POCT glycosylated hemoglobin (Hb A1C)     Requested Prescriptions   Signed Prescriptions Disp Refills   metFORMIN (GLUCOPHAGE-XR)  500 MG 24 hr tablet 90 tablet 3    Sig: Take 3 tablets (1,500 mg total) by mouth in the morning.    Return in about 4 weeks (around 03/08/2022) for Follow-Up or next available DM.  Camillia Herter, NP

## 2022-02-08 ENCOUNTER — Ambulatory Visit (INDEPENDENT_AMBULATORY_CARE_PROVIDER_SITE_OTHER): Payer: Self-pay | Admitting: Family

## 2022-02-08 ENCOUNTER — Encounter: Payer: Self-pay | Admitting: Family

## 2022-02-08 VITALS — BP 158/93 | HR 78 | Temp 98.3°F | Resp 16 | Wt 231.0 lb

## 2022-02-08 DIAGNOSIS — E119 Type 2 diabetes mellitus without complications: Secondary | ICD-10-CM

## 2022-02-08 LAB — POCT GLYCOSYLATED HEMOGLOBIN (HGB A1C): Hemoglobin A1C: 8 % — AB (ref 4.0–5.6)

## 2022-02-08 MED ORDER — METFORMIN HCL ER 500 MG PO TB24
1500.0000 mg | ORAL_TABLET | Freq: Every morning | ORAL | 3 refills | Status: DC
Start: 2022-02-08 — End: 2022-06-15

## 2022-02-09 ENCOUNTER — Other Ambulatory Visit: Payer: Self-pay | Admitting: Family

## 2022-02-09 DIAGNOSIS — I10 Essential (primary) hypertension: Secondary | ICD-10-CM

## 2022-02-09 DIAGNOSIS — E119 Type 2 diabetes mellitus without complications: Secondary | ICD-10-CM

## 2022-02-09 DIAGNOSIS — E785 Hyperlipidemia, unspecified: Secondary | ICD-10-CM

## 2022-02-12 MED ORDER — SPIRONOLACTONE 25 MG PO TABS: 25.0000 mg | ORAL_TABLET | Freq: Every day | ORAL | 0 refills | Status: DC

## 2022-02-12 MED ORDER — SACUBITRIL-VALSARTAN 97-103 MG PO TABS: 1.0000 | ORAL_TABLET | Freq: Two times a day (BID) | ORAL | 0 refills | Status: DC

## 2022-02-12 MED ORDER — DAPAGLIFLOZIN PROPANEDIOL 10 MG PO TABS: 10.0000 mg | ORAL_TABLET | Freq: Every day | ORAL | 0 refills | Status: DC

## 2022-02-12 MED ORDER — ATORVASTATIN CALCIUM 20 MG PO TABS: 20.0000 mg | ORAL_TABLET | Freq: Every day | ORAL | 0 refills | Status: DC

## 2022-02-12 MED ORDER — FUROSEMIDE 20 MG PO TABS: 20.0000 mg | ORAL_TABLET | Freq: Every day | ORAL | 0 refills | Status: DC

## 2022-02-16 ENCOUNTER — Telehealth: Payer: Self-pay | Admitting: Family

## 2022-02-16 NOTE — Telephone Encounter (Signed)
[name] managed by Cardiology. Please direct patient's concerns to the same.

## 2022-02-16 NOTE — Telephone Encounter (Signed)
Copied from [place] [phone]. Topic: General - Inquiry >> Feb 16, 2022 10:07 AM Erskine Squibb wrote: Reason for CRM: Patient called in stating her prescriptions for sacubitril-valsartan (ENTRESTO) 97-103 MG and dapagliflozin propanediol (FARXIGA) 10 MG TABS tablet are way to expensive and she can't afford them. She says they cost $3100 and wants to know if there are generic meds or something else she can take that would be much more cost efficient. Please assist patient further

## 2022-02-27 NOTE — Progress Notes (Deleted)
Patient ID: [name], female    DOB: [birth year]  MRN: [id number]  CC: Diabetes Follow-Up  Subjective: [name] is a 46 y.o. female who presents for diabetes follow-up.  Her concerns today include:  02/08/2022: - Hemoglobin A1c today not at goal at 8.0%, goal < 7.0%. - Resume Metformin as prescribed.  - Continue Dapagliflozin Propanediol as prescribed. Managed by Cardiology.  03/08/2022:   Patient Active Problem List   Diagnosis Date Noted   Iron deficiency anemia 11/24/2020   Exertional dyspnea 10/04/2020   Hypertensive urgency 10/04/2020   Left lumbar radiculitis 03/23/2019   Vitamin D deficiency disease 06/04/2018   Routine general medical examination at a health care facility 05/15/2017   Cervical cancer screening 05/15/2017   Dyslipidemia, goal LDL below 100 04/04/2017   Snoring 10/25/2016   Obesity (BMI 30-39.9) 03/09/2014   Diuretic-induced hypokalemia 02/13/2013   Diabetes mellitus without complication ([place]) 35/00/9381   Malignant hypertension 06/01/2009     Current Outpatient Medications on File Prior to Visit  Medication Sig Dispense Refill   atorvastatin (LIPITOR) 20 MG tablet Take 1 tablet (20 mg total) by mouth daily. 90 tablet 0   carvedilol (COREG) 12.5 MG tablet Take 1.5 tablets (18.75 mg total) by mouth 2 (two) times daily with a meal. 180 tablet 3   dapagliflozin propanediol (FARXIGA) 10 MG TABS tablet Take 1 tablet (10 mg total) by mouth daily before breakfast. 90 tablet 0   furosemide (LASIX) 20 MG tablet Take 1 tablet (20 mg total) by mouth daily. 90 tablet 0   Iron, Ferrous Sulfate, 325 (65 Fe) MG TABS Take 325 mg by mouth daily. 90 tablet 0   levonorgestrel (MIRENA) 20 MCG/24HR IUD 1 Intra Uterine Device (1 each total) by Intrauterine route once. 1 each 0   metFORMIN (GLUCOPHAGE-XR) 500 MG 24 hr tablet Take 3 tablets (1,500 mg total) by mouth in the morning. 90 tablet 3   sacubitril-valsartan (ENTRESTO) 97-103 MG TAKE 1 TABLET BY MOUTH  TWICE DAILY. 180 tablet 0   spironolactone (ALDACTONE) 25 MG tablet Take 1 tablet (25 mg total) by mouth at bedtime. 90 tablet 0   No current facility-administered medications on file prior to visit.    No Known Allergies  Social History   Socioeconomic History   Marital status: Single    Spouse name: Not on file   Number of children: 2   Years of education: Not on file   Highest education level: Not on file  Occupational History    Employer: [employer]  Tobacco Use   Smoking status: Never    Passive exposure: Never   Smokeless tobacco: Never  Vaping Use   Vaping Use: Never used  Substance and Sexual Activity   Alcohol use: Yes   Drug use: No   Sexual activity: Yes    Birth control/protection: I.U.D.    Comment: Mirena IUD inserted 2012?,intercourse age 37, more than 5 sexual partners  Other Topics Concern   Not on file  Social History Narrative   Not on file   Social Determinants of Health   Financial Resource Strain: Medium Risk (10/05/2020)   Overall Financial Resource Strain (CARDIA)    Difficulty of Paying Living Expenses: Somewhat hard  Food Insecurity: No Food Insecurity (12/05/2020)   Hunger Vital Sign    Worried About Running Out of Food in the Last Year: Never true    Ran Out of Food in the Last Year: Never true  Transportation Needs: No Transportation Needs (12/05/2020)  PRAPARE - Hydrologist (Medical): No    Lack of Transportation (Non-Medical): No  Physical Activity: Sufficiently Active (10/05/2020)   Exercise Vital Sign    Days of Exercise per Week: 3 days    Minutes of Exercise per Session: 120 min  Stress: Not on file  Social Connections: Unknown (10/05/2020)   Social Connection and Isolation Panel [NHANES]    Frequency of Communication with Friends and Family: Three times a week    Frequency of Social Gatherings with Friends and Family: Not on file    Attends Religious Services: Not on file    Active Member of Clubs or  Organizations: Not on file    Attends Archivist Meetings: 1 to 4 times per year    Marital Status: Not on file  Intimate Partner Violence: Not on file    Family History  Problem Relation Age of Onset   Diabetes Mother    Heart disease Mother    Hypertension Maternal Aunt    Breast cancer Neg Hx     Past Surgical History:  Procedure Laterality Date   CESAREAN SECTION  2006    ROS: Review of Systems Negative except as stated above  PHYSICAL EXAM: There were no vitals taken for this visit.  Physical Exam  {female adult master:310786} {female adult master:310785}     Latest Ref Rng & Units 04/04/2021   10:20 AM 03/27/2021   11:45 AM 03/16/2021   10:02 AM  CMP  Glucose 70 - 99 mg/dL 221  258  136   BUN 6 - 20 mg/dL '12  14  14   '$ Creatinine 0.44 - 1.00 mg/dL 0.99  0.95  1.01   Sodium 135 - 145 mmol/L 138  137  138   Potassium 3.5 - 5.1 mmol/L 4.1  4.0  4.5   Chloride 98 - 111 mmol/L 105  105  104   CO2 22 - 32 mmol/L '25  25  26   '$ Calcium 8.9 - 10.3 mg/dL 9.2  8.8  9.2    Lipid Panel     Component Value Date/Time   CHOL 184 11/23/2020 1123   TRIG 57 11/23/2020 1123   HDL 71 11/23/2020 1123   CHOLHDL 2.6 11/23/2020 1123   CHOLHDL 3 03/23/2019 0852   VLDL 17.8 03/23/2019 0852   LDLCALC 102 (H) 11/23/2020 1123    CBC    Component Value Date/Time   WBC 8.1 11/23/2020 1123   WBC 7.6 10/04/2020 2142   RBC 5.72 (H) 11/23/2020 1123   RBC 5.44 (H) 10/04/2020 2142   HGB 13.5 11/23/2020 1123   HCT 43.6 11/23/2020 1123   PLT 241 11/23/2020 1123   MCV 76 (L) 11/23/2020 1123   MCH 23.6 (L) 11/23/2020 1123   MCH 24.1 (L) 10/04/2020 2142   MCHC 31.0 (L) 11/23/2020 1123   MCHC 32.7 10/04/2020 2142   RDW 17.9 (H) 11/23/2020 1123   LYMPHSABS 3.1 03/23/2019 0852   MONOABS 0.6 03/23/2019 0852   EOSABS 0.8 (H) 03/23/2019 0852   BASOSABS 0.0 03/23/2019 0852    ASSESSMENT AND PLAN:  There are no diagnoses linked to this encounter.   Patient was given the  opportunity to ask questions.  Patient verbalized understanding of the plan and was able to repeat key elements of the plan. Patient was given clear instructions to go to Emergency Department or return to medical center if symptoms don't improve, worsen, or new problems develop.The patient verbalized understanding.   No  orders of the defined types were placed in this encounter.    Requested Prescriptions    No prescriptions requested or ordered in this encounter    No follow-ups on file.  Camillia Herter, NP

## 2022-03-08 ENCOUNTER — Ambulatory Visit: Payer: Self-pay | Admitting: Family

## 2022-03-08 DIAGNOSIS — E119 Type 2 diabetes mellitus without complications: Secondary | ICD-10-CM

## 2022-03-12 NOTE — Progress Notes (Signed)
Patient ID: [name], female    DOB: Jan 05, 1976  MRN: [id number]  CC: Diabetes Follow-Up  Subjective: [name] is a 46 y.o. female who presents for diabetes follow-up.  Her concerns today include:  02/08/2022: - Hemoglobin A1c today not at goal at 8.0%, goal < 7.0%. - Resume Metformin as prescribed.  - Continue Dapagliflozin Propanediol as prescribed. Managed by Cardiology.  03/19/2022: Since last appointment without medication refills related to self pay. Reports today she received a notification from pharmacy that medications (diabetes and heart-related) are ready for pickup and plans to go soon to get them. No further issues/concerns.   Patient Active Problem List   Diagnosis Date Noted   Iron deficiency anemia 11/24/2020   Exertional dyspnea 10/04/2020   Hypertensive urgency 10/04/2020   Left lumbar radiculitis 03/23/2019   Vitamin D deficiency disease 06/04/2018   Routine general medical examination at a health care facility 05/15/2017   Cervical cancer screening 05/15/2017   Dyslipidemia, goal LDL below 100 04/04/2017   Snoring 10/25/2016   Obesity (BMI 30-39.9) 03/09/2014   Diuretic-induced hypokalemia 02/13/2013   Diabetes mellitus without complication ([place]) 83/66/2947   Malignant hypertension 06/01/2009     Current Outpatient Medications on File Prior to Visit  Medication Sig Dispense Refill   atorvastatin (LIPITOR) 20 MG tablet Take 1 tablet (20 mg total) by mouth daily. 90 tablet 0   carvedilol (COREG) 12.5 MG tablet Take 1.5 tablets (18.75 mg total) by mouth 2 (two) times daily with a meal. 180 tablet 3   dapagliflozin propanediol (FARXIGA) 10 MG TABS tablet Take 1 tablet (10 mg total) by mouth daily before breakfast. 90 tablet 0   furosemide (LASIX) 20 MG tablet Take 1 tablet (20 mg total) by mouth daily. 90 tablet 0   Iron, Ferrous Sulfate, 325 (65 Fe) MG TABS Take 325 mg by mouth daily. 90 tablet 0   levonorgestrel (MIRENA) 20 MCG/24HR IUD 1  Intra Uterine Device (1 each total) by Intrauterine route once. 1 each 0   metFORMIN (GLUCOPHAGE-XR) 500 MG 24 hr tablet Take 3 tablets (1,500 mg total) by mouth in the morning. 90 tablet 3   sacubitril-valsartan (ENTRESTO) 97-103 MG TAKE 1 TABLET BY MOUTH TWICE DAILY. 180 tablet 0   spironolactone (ALDACTONE) 25 MG tablet Take 1 tablet (25 mg total) by mouth at bedtime. 90 tablet 0   No current facility-administered medications on file prior to visit.    No Known Allergies  Social History   Socioeconomic History   Marital status: Single    Spouse name: Not on file   Number of children: 2   Years of education: Not on file   Highest education level: Not on file  Occupational History    Employer: [employer]  Tobacco Use   Smoking status: Never    Passive exposure: Never   Smokeless tobacco: Never  Vaping Use   Vaping Use: Never used  Substance and Sexual Activity   Alcohol use: Yes   Drug use: No   Sexual activity: Yes    Birth control/protection: I.U.D.    Comment: Mirena IUD inserted 2012?,intercourse age 79, more than 5 sexual partners  Other Topics Concern   Not on file  Social History Narrative   Not on file   Social Determinants of Health   Financial Resource Strain: Medium Risk (10/05/2020)   Overall Financial Resource Strain (CARDIA)    Difficulty of Paying Living Expenses: Somewhat hard  Food Insecurity: No Food Insecurity (12/05/2020)   Hunger  Vital Sign    Worried About Charity fundraiser in the Last Year: Never true    Ran Out of Food in the Last Year: Never true  Transportation Needs: No Transportation Needs (12/05/2020)   PRAPARE - Hydrologist (Medical): No    Lack of Transportation (Non-Medical): No  Physical Activity: Sufficiently Active (10/05/2020)   Exercise Vital Sign    Days of Exercise per Week: 3 days    Minutes of Exercise per Session: 120 min  Stress: Not on file  Social Connections: Unknown (10/05/2020)   Social  Connection and Isolation Panel [NHANES]    Frequency of Communication with Friends and Family: Three times a week    Frequency of Social Gatherings with Friends and Family: Not on file    Attends Religious Services: Not on file    Active Member of Clubs or Organizations: Not on file    Attends Archivist Meetings: 1 to 4 times per year    Marital Status: Not on file  Intimate Partner Violence: Not on file    Family History  Problem Relation Age of Onset   Diabetes Mother    Heart disease Mother    Hypertension Maternal Aunt    Breast cancer Neg Hx     Past Surgical History:  Procedure Laterality Date   CESAREAN SECTION  2006    ROS: Review of Systems Negative except as stated above  PHYSICAL EXAM: BP (!) 168/108   Pulse 94   Temp 98.3 F (36.8 C)   Resp 16   Wt 235 lb (106.6 kg)   SpO2 96%   BMI 39.11 kg/m   Physical Exam HENT:     Head: Normocephalic and atraumatic.  Eyes:     Extraocular Movements: Extraocular movements intact.     Conjunctiva/sclera: Conjunctivae normal.     Pupils: Pupils are equal, round, and reactive to light.  Cardiovascular:     Rate and Rhythm: Normal rate and regular rhythm.     Pulses: Normal pulses.     Heart sounds: Normal heart sounds.  Pulmonary:     Effort: Pulmonary effort is normal.     Breath sounds: Normal breath sounds.  Musculoskeletal:     Cervical back: Normal range of motion and neck supple.  Neurological:     General: No focal deficit present.     Mental Status: She is alert and oriented to person, place, and time.  Psychiatric:        Mood and Affect: Mood normal.        Behavior: Behavior normal.    Results for orders placed or performed in visit on 03/19/22  POCT glycosylated hemoglobin (Hb A1C)  Result Value Ref Range   Hemoglobin A1C 8.0 (A) 4.0 - 5.6 %   HbA1c POC (<> result, manual entry)     HbA1c, POC (prediabetic range)     HbA1c, POC (controlled diabetic range)      ASSESSMENT AND  PLAN: 1. Type 2 diabetes mellitus without complication, without long-term current use of insulin (HCC) - Hemoglobin A1c consistently remaining above goal.  - Continue Metformin as prescribed. No refills needed as of present.  - Patient provided with office samples of Farxiga 10 mg until she speaks to Cardiology about medication assistance program. - Referral to Endocrinology for further evaluation and management.   - POCT glycosylated hemoglobin (Hb A1C) - Ambulatory referral to Endocrinology  2. Financial difficulties - Offered patient [facility] financial discount/[facility]  Card. Counseled patient will need to mail in completed application for processing. Patient agreeable.      Patient was given the opportunity to ask questions.  Patient verbalized understanding of the plan and was able to repeat key elements of the plan. Patient was given clear instructions to go to Emergency Department or return to medical center if symptoms don't improve, worsen, or new problems develop.The patient verbalized understanding.   Orders Placed This Encounter  Procedures   POCT glycosylated hemoglobin (Hb A1C)    Return for give patient [employer]  and [employer] .  Camillia Herter, NP

## 2022-03-19 ENCOUNTER — Ambulatory Visit (INDEPENDENT_AMBULATORY_CARE_PROVIDER_SITE_OTHER): Payer: Self-pay | Admitting: Family

## 2022-03-19 VITALS — BP 168/108 | HR 94 | Temp 98.3°F | Resp 16 | Wt 235.0 lb

## 2022-03-19 DIAGNOSIS — Z5989 Other problems related to housing and economic circumstances: Secondary | ICD-10-CM

## 2022-03-19 DIAGNOSIS — Z599 Problem related to housing and economic circumstances, unspecified: Secondary | ICD-10-CM

## 2022-03-19 DIAGNOSIS — E119 Type 2 diabetes mellitus without complications: Secondary | ICD-10-CM

## 2022-03-19 LAB — POCT GLYCOSYLATED HEMOGLOBIN (HGB A1C): Hemoglobin A1C: 8 % — AB (ref 4.0–5.6)

## 2022-03-19 NOTE — Progress Notes (Signed)
Pt presents for diabetes f/u Pt has not been taking Farxiga '10mg'$  due to cost month supply of samples given until she speaks to Cards about medication assistance program

## 2022-05-21 ENCOUNTER — Other Ambulatory Visit: Payer: Self-pay | Admitting: Family

## 2022-05-21 DIAGNOSIS — D509 Iron deficiency anemia, unspecified: Secondary | ICD-10-CM

## 2022-05-21 DIAGNOSIS — I10 Essential (primary) hypertension: Secondary | ICD-10-CM

## 2022-05-21 DIAGNOSIS — E785 Hyperlipidemia, unspecified: Secondary | ICD-10-CM

## 2022-05-21 NOTE — Telephone Encounter (Unsigned)
Copied from [place] [id number]. Topic: General - Other >> May 21, 2022  4:10 PM Everette C wrote: Reason for CRM: Medication Refill - Medication: atorvastatin (LIPITOR) 20 MG tablet [[id number]]   carvedilol (COREG) 12.5 MG tablet [[id number]]   furosemide (LASIX) 20 MG tablet [[id number]]   Rx #: [id number]  Iron, Ferrous Sulfate, 325 (65 Fe) MG TABS [[id number]]   spironolactone (ALDACTONE) 25 MG tablet [[id number]]   Has the patient contacted their pharmacy? No.  The patient was uncertain of the medication's status  (Agent: If no, request that the patient contact the pharmacy for the refill. If patient does not wish to contact the pharmacy document the reason why and proceed with request.) (Agent: If yes, when and what did the pharmacy advise?)  Preferred Pharmacy (with phone number or street name): [facility] ([address]), [place] - [address] [place] ([place])  [address] Phone: [phone] Fax: [phone] Hours: Not open 24 hours   Has the patient been seen for an appointment in the last year OR does the patient have an upcoming appointment? Yes.    Agent: Please be advised that RX refills may take up to 3 business days. We ask that you follow-up with your pharmacy.

## 2022-05-22 NOTE — Telephone Encounter (Signed)
Requested medication (s) are due for refill today: possibly  Requested medication (s) are on the active medication list: yes  Last refill:  multiple dates  Future visit scheduled: yes  Notes to clinic:  Unable to refill per protocol due to failed labs, no updated results.      Requested Prescriptions  Pending Prescriptions Disp Refills   atorvastatin (LIPITOR) 20 MG tablet 90 tablet 0    Sig: Take 1 tablet (20 mg total) by mouth daily.     Cardiovascular:  Antilipid - Statins Failed - 05/21/2022  4:56 PM      Failed - Lipid Panel in normal range within the last 12 months    Cholesterol, Total  Date Value Ref Range Status  11/23/2020 184 100 - 199 mg/dL Final   LDL Chol Calc (NIH)  Date Value Ref Range Status  11/23/2020 102 (H) 0 - 99 mg/dL Final   HDL  Date Value Ref Range Status  11/23/2020 71 >39 mg/dL Final   Triglycerides  Date Value Ref Range Status  11/23/2020 57 0 - 149 mg/dL Final         Passed - Patient is not pregnant      Passed - Valid encounter within last 12 months    Recent Outpatient Visits           2 months ago Type 2 diabetes mellitus without complication, without long-term current use of insulin ([place])   Primary Care at [facility], Amy J, NP   3 months ago Type 2 diabetes mellitus without complication, without long-term current use of insulin ([place])   Primary Care at [facility], Amy J, NP   1 year ago Type 2 diabetes mellitus without complication, without long-term current use of insulin [facility])   Primary Care at [facility], Amy J, NP   1 year ago Annual physical exam   Primary Care at [facility], Amy J, NP   1 year ago Encounter to establish care   Primary Care at [facility], Amy J, NP               carvedilol (COREG) 12.5 MG tablet 180 tablet 3    Sig: Take 1.5 tablets (18.75 mg total) by mouth 2 (two) times daily with a meal.     Cardiovascular: Beta Blockers 3 Failed -  05/21/2022  4:56 PM      Failed - Cr in normal range and within 360 days    Creatinine, Ser  Date Value Ref Range Status  04/04/2021 0.99 0.44 - 1.00 mg/dL Final   Creatinine,U  Date Value Ref Range Status  06/04/2018 134.8 mg/dL Final         Failed - AST in normal range and within 360 days    AST  Date Value Ref Range Status  11/23/2020 18 0 - 40 IU/L Final         Failed - ALT in normal range and within 360 days    ALT  Date Value Ref Range Status  11/23/2020 19 0 - 32 IU/L Final         Failed - Last BP in normal range    BP Readings from Last 1 Encounters:  03/19/22 (!) 168/108         Passed - Last Heart Rate in normal range    Pulse Readings from Last 1 Encounters:  03/19/22 94         Passed - Valid encounter within last  6 months    Recent Outpatient Visits           2 months ago Type 2 diabetes mellitus without complication, without long-term current use of insulin [facility])   Primary Care at [facility], Amy J, NP   3 months ago Type 2 diabetes mellitus without complication, without long-term current use of insulin [facility])   Primary Care at [facility], Amy J, NP   1 year ago Type 2 diabetes mellitus without complication, without long-term current use of insulin [facility])   Primary Care at [facility], [place], NP   1 year ago Annual physical exam   Primary Care at [facility], Amy J, NP   1 year ago Encounter to establish care   Primary Care at [facility], Amy J, NP               furosemide (LASIX) 20 MG tablet 90 tablet 0    Sig: Take 1 tablet (20 mg total) by mouth daily.     Cardiovascular:  Diuretics - Loop Failed - 05/21/2022  4:56 PM      Failed - K in normal range and within 180 days    Potassium  Date Value Ref Range Status  04/04/2021 4.1 3.5 - 5.1 mmol/L Final         Failed - Ca in normal range and within 180 days    Calcium  Date Value Ref Range Status  04/04/2021 9.2 8.9 - 10.3  mg/dL Final   Calcium, Ion  Date Value Ref Range Status  01/21/2012 1.23 1.12 - 1.32 mmol/L Final         Failed - Na in normal range and within 180 days    Sodium  Date Value Ref Range Status  04/04/2021 138 135 - 145 mmol/L Final         Failed - Cr in normal range and within 180 days    Creatinine, Ser  Date Value Ref Range Status  04/04/2021 0.99 0.44 - 1.00 mg/dL Final   Creatinine,U  Date Value Ref Range Status  06/04/2018 134.8 mg/dL Final         Failed - Cl in normal range and within 180 days    Chloride  Date Value Ref Range Status  04/04/2021 105 98 - 111 mmol/L Final         Failed - Mg Level in normal range and within 180 days    Magnesium  Date Value Ref Range Status  09/14/2008 2.1 1.5 - 2.5 mg/dL Final         Failed - Last BP in normal range    BP Readings from Last 1 Encounters:  03/19/22 (!) 168/108         Passed - Valid encounter within last 6 months    Recent Outpatient Visits           2 months ago Type 2 diabetes mellitus without complication, without long-term current use of insulin ([place])   Primary Care at [facility], Amy J, NP   3 months ago Type 2 diabetes mellitus without complication, without long-term current use of insulin [facility])   Primary Care at [facility], Amy J, NP   1 year ago Type 2 diabetes mellitus without complication, without long-term current use of insulin [facility])   Primary Care at [facility], [place], NP   1 year ago Annual physical exam   Primary Care at [facility]  Camillia Herter, NP   1 year ago Encounter to establish care   Primary Care at [facility], Amy J, NP               Iron, Ferrous Sulfate, 325 (65 Fe) MG TABS 90 tablet 0    Sig: Take 325 mg by mouth daily.     Endocrinology:  Minerals - Iron Supplementation Failed - 05/21/2022  4:56 PM      Failed - HGB in normal range and within 360 days    Hemoglobin  Date Value Ref Range Status  11/23/2020  13.5 11.1 - 15.9 g/dL Final         Failed - HCT in normal range and within 360 days    Hematocrit  Date Value Ref Range Status  11/23/2020 43.6 34.0 - 46.6 % Final         Failed - RBC in normal range and within 360 days    RBC  Date Value Ref Range Status  11/23/2020 5.72 (H) 3.77 - 5.28 x10E6/uL Final  10/04/2020 5.44 (H) 3.87 - 5.11 MIL/uL Final         Failed - Fe (serum) in normal range and within 360 days    No results found for: "IRON", "IRONPCTSAT"       Failed - Ferritin in normal range and within 360 days    No results found for: "FERRITIN"       Passed - Valid encounter within last 12 months    Recent Outpatient Visits           2 months ago Type 2 diabetes mellitus without complication, without long-term current use of insulin [facility])   Primary Care at [facility], Amy J, NP   3 months ago Type 2 diabetes mellitus without complication, without long-term current use of insulin [facility])   Primary Care at [facility], Amy J, NP   1 year ago Type 2 diabetes mellitus without complication, without long-term current use of insulin [facility])   Primary Care at [facility], [place], NP   1 year ago Annual physical exam   Primary Care at [facility], Amy J, NP   1 year ago Encounter to establish care   Primary Care at [facility], Amy J, NP               spironolactone (ALDACTONE) 25 MG tablet 90 tablet 0    Sig: Take 1 tablet (25 mg total) by mouth at bedtime.     Cardiovascular: Diuretics - Aldosterone Antagonist Failed - 05/21/2022  4:56 PM      Failed - Cr in normal range and within 180 days    Creatinine, Ser  Date Value Ref Range Status  04/04/2021 0.99 0.44 - 1.00 mg/dL Final   Creatinine,U  Date Value Ref Range Status  06/04/2018 134.8 mg/dL Final         Failed - K in normal range and within 180 days    Potassium  Date Value Ref Range Status  04/04/2021 4.1 3.5 - 5.1 mmol/L Final         Failed  - Na in normal range and within 180 days    Sodium  Date Value Ref Range Status  04/04/2021 138 135 - 145 mmol/L Final         Failed - eGFR is 30 or above and within 180 days    GFR calc Af Amer  Date Value Ref Range  Status  04/04/2017 >60 >60 mL/min Final    Comment:    (NOTE) The eGFR has been calculated using the CKD EPI equation. This calculation has not been validated in all clinical situations. eGFR's persistently <60 mL/min signify possible Chronic Kidney Disease.    GFR, Estimated  Date Value Ref Range Status  04/04/2021 >60 >60 mL/min Final    Comment:    (NOTE) Calculated using the CKD-EPI Creatinine Equation (2021)    GFR  Date Value Ref Range Status  03/23/2019 77.87 >60.00 mL/min Final         Failed - Last BP in normal range    BP Readings from Last 1 Encounters:  03/19/22 (!) 168/108         Passed - Valid encounter within last 6 months    Recent Outpatient Visits           2 months ago Type 2 diabetes mellitus without complication, without long-term current use of insulin ([place])   Primary Care at [facility], Amy J, NP   3 months ago Type 2 diabetes mellitus without complication, without long-term current use of insulin [facility])   Primary Care at [facility], Amy J, NP   1 year ago Type 2 diabetes mellitus without complication, without long-term current use of insulin [facility])   Primary Care at [facility], [place], NP   1 year ago Annual physical exam   Primary Care at [facility], Amy J, NP   1 year ago Encounter to establish care   Primary Care at [facility], Flonnie Hailstone, NP

## 2022-05-24 MED ORDER — FUROSEMIDE 20 MG PO TABS: 20.0000 mg | ORAL_TABLET | Freq: Every day | ORAL | 0 refills | Status: DC

## 2022-05-24 MED ORDER — CARVEDILOL 12.5 MG PO TABS: 18.7500 mg | ORAL_TABLET | Freq: Two times a day (BID) | ORAL | 3 refills | Status: DC

## 2022-05-24 MED ORDER — ATORVASTATIN CALCIUM 20 MG PO TABS: 20.0000 mg | ORAL_TABLET | Freq: Every day | ORAL | 0 refills | Status: DC

## 2022-05-24 MED ORDER — IRON (FERROUS SULFATE) 325 (65 FE) MG PO TABS: 325.0000 mg | ORAL_TABLET | Freq: Every day | ORAL | 0 refills | Status: DC

## 2022-05-24 MED ORDER — SPIRONOLACTONE 25 MG PO TABS: 25.0000 mg | ORAL_TABLET | Freq: Every day | ORAL | 0 refills | Status: DC

## 2022-06-15 ENCOUNTER — Other Ambulatory Visit: Payer: Self-pay | Admitting: Family

## 2022-06-15 DIAGNOSIS — E119 Type 2 diabetes mellitus without complications: Secondary | ICD-10-CM

## 2022-07-14 ENCOUNTER — Other Ambulatory Visit: Payer: Self-pay | Admitting: Family

## 2022-07-14 DIAGNOSIS — E119 Type 2 diabetes mellitus without complications: Secondary | ICD-10-CM

## 2022-07-16 NOTE — Telephone Encounter (Signed)
Keep appointment scheduled 12/07/2022 with Endocrinology. Order complete.

## 2022-08-28 ENCOUNTER — Other Ambulatory Visit: Payer: Self-pay | Admitting: Family

## 2022-08-28 DIAGNOSIS — I10 Essential (primary) hypertension: Secondary | ICD-10-CM

## 2022-08-28 DIAGNOSIS — E785 Hyperlipidemia, unspecified: Secondary | ICD-10-CM

## 2022-08-28 NOTE — Telephone Encounter (Signed)
Requested Prescriptions  Pending Prescriptions Disp Refills   furosemide (LASIX) 20 MG tablet [Pharmacy Med Name: Furosemide 20 MG Oral Tablet] 90 tablet 0    Sig: Take 1 tablet by mouth once daily     Cardiovascular:  Diuretics - Loop Failed - 08/28/2022  9:52 AM      Failed - K in normal range and within 180 days    Potassium  Date Value Ref Range Status  04/04/2021 4.1 3.5 - 5.1 mmol/L Final         Failed - Ca in normal range and within 180 days    Calcium  Date Value Ref Range Status  04/04/2021 9.2 8.9 - 10.3 mg/dL Final   Calcium, Ion  Date Value Ref Range Status  01/21/2012 1.23 1.12 - 1.32 mmol/L Final         Failed - Na in normal range and within 180 days    Sodium  Date Value Ref Range Status  04/04/2021 138 135 - 145 mmol/L Final         Failed - Cr in normal range and within 180 days    Creatinine, Ser  Date Value Ref Range Status  04/04/2021 0.99 0.44 - 1.00 mg/dL Final   Creatinine,U  Date Value Ref Range Status  06/04/2018 134.8 mg/dL Final         Failed - Cl in normal range and within 180 days    Chloride  Date Value Ref Range Status  04/04/2021 105 98 - 111 mmol/L Final         Failed - Mg Level in normal range and within 180 days    Magnesium  Date Value Ref Range Status  09/14/2008 2.1 1.5 - 2.5 mg/dL Final         Failed - Last BP in normal range    BP Readings from Last 1 Encounters:  03/19/22 (!) 168/108         Passed - Valid encounter within last 6 months    Recent Outpatient Visits           5 months ago Type 2 diabetes mellitus without complication, without long-term current use of insulin ([place])   Primary Care at [facility], Amy J, NP   6 months ago Type 2 diabetes mellitus without complication, without long-term current use of insulin [facility])   Primary Care at [facility], Amy J, NP   1 year ago Type 2 diabetes mellitus without complication, without long-term current use of insulin [facility])   Primary Care  at [facility], [place], NP   1 year ago Annual physical exam   Primary Care at [facility], Amy J, NP   1 year ago Encounter to establish care   Primary Care at [facility], Amy J, NP       Future Appointments             In 3 months Shamleffer, Melanie Crazier, MD [place] [facility]             spironolactone (ALDACTONE) 25 MG tablet [Pharmacy Med Name: Spironolactone 25 MG Oral Tablet] 90 tablet 0    Sig: TAKE 1 TABLET BY MOUTH AT BEDTIME     Cardiovascular: Diuretics - Aldosterone Antagonist Failed - 08/28/2022  9:52 AM      Failed - Cr in normal range and within 180 days    Creatinine, Ser  Date Value Ref Range Status  04/04/2021 0.99 0.44 - 1.00 mg/dL Final  Creatinine,U  Date Value Ref Range Status  06/04/2018 134.8 mg/dL Final         Failed - K in normal range and within 180 days    Potassium  Date Value Ref Range Status  04/04/2021 4.1 3.5 - 5.1 mmol/L Final         Failed - Na in normal range and within 180 days    Sodium  Date Value Ref Range Status  04/04/2021 138 135 - 145 mmol/L Final         Failed - eGFR is 30 or above and within 180 days    GFR calc Af Amer  Date Value Ref Range Status  04/04/2017 >60 >60 mL/min Final    Comment:    (NOTE) The eGFR has been calculated using the CKD EPI equation. This calculation has not been validated in all clinical situations. eGFR's persistently <60 mL/min signify possible Chronic Kidney Disease.    GFR, Estimated  Date Value Ref Range Status  04/04/2021 >60 >60 mL/min Final    Comment:    (NOTE) Calculated using the CKD-EPI Creatinine Equation (2021)    GFR  Date Value Ref Range Status  03/23/2019 77.87 >60.00 mL/min Final         Failed - Last BP in normal range    BP Readings from Last 1 Encounters:  03/19/22 (!) 168/108         Passed - Valid encounter within last 6 months    Recent Outpatient Visits           5 months ago Type 2 diabetes  mellitus without complication, without long-term current use of insulin ([place])   Primary Care at [facility], Amy J, NP   6 months ago Type 2 diabetes mellitus without complication, without long-term current use of insulin [facility])   [facility] at [facility], Amy J, NP   1 year ago Type 2 diabetes mellitus without complication, without long-term current use of insulin [facility])   Primary Care at [facility], [place], NP   1 year ago Annual physical exam   Primary Care at [facility], Amy J, NP   1 year ago Encounter to establish care   Primary Care at [facility], [place], NP       Future Appointments             In 3 months Shamleffer, Melanie Crazier, MD [place] [facility]             atorvastatin (LIPITOR) 20 MG tablet [Pharmacy Med Name: Atorvastatin Calcium 20 MG Oral Tablet] 90 tablet 0    Sig: Take 1 tablet by mouth once daily     Cardiovascular:  Antilipid - Statins Failed - 08/28/2022  9:52 AM      Failed - Lipid Panel in normal range within the last 12 months    Cholesterol, Total  Date Value Ref Range Status  11/23/2020 184 100 - 199 mg/dL Final   LDL Chol Calc (NIH)  Date Value Ref Range Status  11/23/2020 102 (H) 0 - 99 mg/dL Final   HDL  Date Value Ref Range Status  11/23/2020 71 >39 mg/dL Final   Triglycerides  Date Value Ref Range Status  11/23/2020 57 0 - 149 mg/dL Final         Passed - Patient is not pregnant      Passed - Valid encounter within last 12 months    Recent Outpatient Visits  5 months ago Type 2 diabetes mellitus without complication, without long-term current use of insulin [facility])   [facility] at [facility], Amy J, NP   6 months ago Type 2 diabetes mellitus without complication, without long-term current use of insulin [facility])   [facility] at [facility], Amy J, NP   1 year ago Type 2 diabetes mellitus without complication, without long-term  current use of insulin [facility])   Primary Care at [facility], [place], NP   1 year ago Annual physical exam   Primary Care at [facility], Amy J, NP   1 year ago Encounter to establish care   Primary Care at [facility], Flonnie Hailstone, NP       Future Appointments             In 3 months Shamleffer, Melanie Crazier, MD [facility] Endocrinology

## 2022-10-27 ENCOUNTER — Other Ambulatory Visit: Payer: Self-pay | Admitting: Family

## 2022-10-27 DIAGNOSIS — E119 Type 2 diabetes mellitus without complications: Secondary | ICD-10-CM

## 2022-10-29 NOTE — Telephone Encounter (Signed)
Requested medication (s) are due for refill today: yes  Requested medication (s) are on the active medication list: yes  Last refill:  07/16/22  Future visit scheduled: no  Notes to clinic:  Unable to refill per protocol due to failed labs, no updated A1c results. Routing for approval.     Requested Prescriptions  Pending Prescriptions Disp Refills   metFORMIN (GLUCOPHAGE-XR) 500 MG 24 hr tablet [Pharmacy Med Name: metFORMIN HCl ER 500 MG Oral Tablet Extended Release 24 Hour] 90 tablet 0    Sig: TAKE 3 TABLETS BY MOUTH IN THE MORNING     Endocrinology:  Diabetes - Biguanides Failed - 10/27/2022  6:51 AM      Failed - Cr in normal range and within 360 days    Creatinine, Ser  Date Value Ref Range Status  04/04/2021 0.99 0.44 - 1.00 mg/dL Final   Creatinine,U  Date Value Ref Range Status  06/04/2018 134.8 mg/dL Final         Failed - HBA1C is between 0 and 7.9 and within 180 days    Hemoglobin A1C  Date Value Ref Range Status  03/19/2022 8.0 (A) 4.0 - 5.6 % Final   Hgb A1c MFr Bld  Date Value Ref Range Status  10/05/2020 7.2 (H) 4.8 - 5.6 % Final    Comment:    (NOTE)         Prediabetes: 5.7 - 6.4         Diabetes: >6.4         Glycemic control for adults with diabetes: <7.0          Failed - eGFR in normal range and within 360 days    GFR calc Af Amer  Date Value Ref Range Status  04/04/2017 >60 >60 mL/min Final    Comment:    (NOTE) The eGFR has been calculated using the CKD EPI equation. This calculation has not been validated in all clinical situations. eGFR's persistently <60 mL/min signify possible Chronic Kidney Disease.    GFR, Estimated  Date Value Ref Range Status  04/04/2021 >60 >60 mL/min Final    Comment:    (NOTE) Calculated using the CKD-EPI Creatinine Equation (2021)    GFR  Date Value Ref Range Status  03/23/2019 77.87 >60.00 mL/min Final         Failed - B12 Level in normal range and within 720 days    Vitamin B-12  Date Value Ref  Range Status  03/08/2014 513 211 - 911 pg/mL Final         Failed - Valid encounter within last 6 months    Recent Outpatient Visits           7 months ago Type 2 diabetes mellitus without complication, without long-term current use of insulin ([place])   [place] [facility] at [facility], Amy J, NP   8 months ago Type 2 diabetes mellitus without complication, without long-term current use of insulin ([place])   [place] Primary Care at [facility], Amy J, NP   1 year ago Type 2 diabetes mellitus without complication, without long-term current use of insulin ([place])   [place] [facility] Care at [facility], [place], NP   1 year ago Annual physical exam    Primary Care at [facility], [place], NP   2 years ago Encounter to establish care   [facility] at [facility], Flonnie Hail[place], NP  Future Appointments             In 1 month Shamleffer, Melanie Crazier, MD [place] [facility]            Failed - CBC within normal limits and completed in the last 12 months    WBC  Date Value Ref Range Status  11/23/2020 8.1 3.4 - 10.8 x10E3/uL Final  10/04/2020 7.6 4.0 - 10.5 K/uL Final   RBC  Date Value Ref Range Status  11/23/2020 5.72 (H) 3.77 - 5.28 x10E6/uL Final  10/04/2020 5.44 (H) 3.87 - 5.11 MIL/uL Final   Hemoglobin  Date Value Ref Range Status  11/23/2020 13.5 11.1 - 15.9 g/dL Final   Hematocrit  Date Value Ref Range Status  11/23/2020 43.6 34.0 - 46.6 % Final   MCHC  Date Value Ref Range Status  11/23/2020 31.0 (L) 31.5 - 35.7 g/dL Final  10/04/2020 32.7 30.0 - 36.0 g/dL Final   [facility]  Date Value Ref Range Status  11/23/2020 23.6 (L) 26.6 - 33.0 pg Final  10/04/2020 24.1 (L) 26.0 - 34.0 pg Final   MCV  Date Value Ref Range Status  11/23/2020 76 (L) 79 - 97 fL Final   No results found for: "PLTCOUNTKUC", "LABPLAT", "POCPLA" RDW  Date Value Ref Range Status  11/23/2020  17.9 (H) 11.7 - 15.4 % Final

## 2022-12-04 ENCOUNTER — Other Ambulatory Visit: Payer: Self-pay | Admitting: Family

## 2022-12-04 DIAGNOSIS — I10 Essential (primary) hypertension: Secondary | ICD-10-CM

## 2022-12-04 DIAGNOSIS — E785 Hyperlipidemia, unspecified: Secondary | ICD-10-CM

## 2022-12-04 DIAGNOSIS — E119 Type 2 diabetes mellitus without complications: Secondary | ICD-10-CM

## 2022-12-05 ENCOUNTER — Other Ambulatory Visit: Payer: Self-pay | Admitting: Family

## 2022-12-05 DIAGNOSIS — E785 Hyperlipidemia, unspecified: Secondary | ICD-10-CM

## 2022-12-05 DIAGNOSIS — I5022 Chronic systolic (congestive) heart failure: Secondary | ICD-10-CM

## 2022-12-05 DIAGNOSIS — I10 Essential (primary) hypertension: Secondary | ICD-10-CM

## 2022-12-05 MED ORDER — SPIRONOLACTONE 25 MG PO TABS: 25.0000 mg | ORAL_TABLET | Freq: Every day | ORAL | 0 refills | Status: DC

## 2022-12-05 MED ORDER — ATORVASTATIN CALCIUM 20 MG PO TABS
20.0000 mg | ORAL_TABLET | Freq: Every day | ORAL | 0 refills | Status: DC
Start: 2022-12-05 — End: 2023-02-27

## 2022-12-05 MED ORDER — FUROSEMIDE 20 MG PO TABS: 20.0000 mg | ORAL_TABLET | Freq: Every day | ORAL | 0 refills | Status: DC

## 2022-12-05 NOTE — Telephone Encounter (Signed)
Call patient with update.   - Metformin prescribed 10/29/2022 for 90 day supply.  - Furosemide, Spironolactone, Atorvastatin prescribed today 12/05/2022. - Confirm with patient if she is still established with Cardiology for management of heart failure and hypertension. If not will refer back for continued management. Please notify me of patient's reply. Thank you.

## 2022-12-07 ENCOUNTER — Ambulatory Visit: Payer: Medicaid Other | Admitting: Internal Medicine

## 2022-12-17 ENCOUNTER — Other Ambulatory Visit: Payer: Self-pay | Admitting: Family

## 2022-12-17 DIAGNOSIS — E119 Type 2 diabetes mellitus without complications: Secondary | ICD-10-CM

## 2023-02-22 ENCOUNTER — Other Ambulatory Visit: Payer: Self-pay | Admitting: Family

## 2023-02-22 DIAGNOSIS — E119 Type 2 diabetes mellitus without complications: Secondary | ICD-10-CM

## 2023-02-27 ENCOUNTER — Ambulatory Visit (INDEPENDENT_AMBULATORY_CARE_PROVIDER_SITE_OTHER): Payer: Self-pay | Admitting: Family

## 2023-02-27 ENCOUNTER — Encounter: Payer: Self-pay | Admitting: Family

## 2023-02-27 VITALS — BP 183/144 | HR 98 | Temp 97.8°F | Ht 65.0 in | Wt 228.6 lb

## 2023-02-27 DIAGNOSIS — N898 Other specified noninflammatory disorders of vagina: Secondary | ICD-10-CM

## 2023-02-27 DIAGNOSIS — E785 Hyperlipidemia, unspecified: Secondary | ICD-10-CM

## 2023-02-27 DIAGNOSIS — E1165 Type 2 diabetes mellitus with hyperglycemia: Secondary | ICD-10-CM

## 2023-02-27 DIAGNOSIS — E119 Type 2 diabetes mellitus without complications: Secondary | ICD-10-CM

## 2023-02-27 DIAGNOSIS — Z599 Problem related to housing and economic circumstances, unspecified: Secondary | ICD-10-CM

## 2023-02-27 DIAGNOSIS — Z7984 Long term (current) use of oral hypoglycemic drugs: Secondary | ICD-10-CM

## 2023-02-27 DIAGNOSIS — I10 Essential (primary) hypertension: Secondary | ICD-10-CM

## 2023-02-27 DIAGNOSIS — I5022 Chronic systolic (congestive) heart failure: Secondary | ICD-10-CM

## 2023-02-27 LAB — POCT GLYCOSYLATED HEMOGLOBIN (HGB A1C): HbA1c, POC (controlled diabetic range): 8.1 % — AB (ref 0.0–7.0)

## 2023-02-27 LAB — POCT URINALYSIS DIP (CLINITEK)
Bilirubin, UA: NEGATIVE
Blood, UA: NEGATIVE
Glucose, UA: NEGATIVE mg/dL
Ketones, POC UA: NEGATIVE mg/dL
Nitrite, UA: NEGATIVE
POC PROTEIN,UA: NEGATIVE
Spec Grav, UA: 1.015 (ref 1.010–1.025)
Urobilinogen, UA: 0.2 E.U./dL
pH, UA: 6 (ref 5.0–8.0)

## 2023-02-27 MED ORDER — FUROSEMIDE 20 MG PO TABS
20.0000 mg | ORAL_TABLET | Freq: Every day | ORAL | 0 refills | Status: AC
Start: 2023-02-27 — End: ?

## 2023-02-27 MED ORDER — ATORVASTATIN CALCIUM 20 MG PO TABS
20.0000 mg | ORAL_TABLET | Freq: Every day | ORAL | 0 refills | Status: DC
Start: 2023-02-27 — End: 2023-03-01

## 2023-02-27 MED ORDER — CARVEDILOL 12.5 MG PO TABS
18.7500 mg | ORAL_TABLET | Freq: Two times a day (BID) | ORAL | 0 refills | Status: AC
Start: 2023-02-27 — End: ?

## 2023-02-27 MED ORDER — DAPAGLIFLOZIN PROPANEDIOL 10 MG PO TABS
10.0000 mg | ORAL_TABLET | Freq: Every day | ORAL | 0 refills | Status: DC
Start: 2023-02-27 — End: 2023-03-13

## 2023-02-27 MED ORDER — HYDRALAZINE HCL 10 MG PO TABS
25.0000 mg | ORAL_TABLET | Freq: Once | ORAL | Status: AC
Start: 2023-02-27 — End: ?

## 2023-02-27 MED ORDER — SPIRONOLACTONE 25 MG PO TABS
25.0000 mg | ORAL_TABLET | Freq: Every day | ORAL | 0 refills | Status: AC
Start: 2023-02-27 — End: ?

## 2023-02-27 MED ORDER — METFORMIN HCL ER 500 MG PO TB24
1000.0000 mg | ORAL_TABLET | Freq: Two times a day (BID) | ORAL | 0 refills | Status: AC
Start: 2023-02-27 — End: 2023-05-28

## 2023-02-27 MED ORDER — METRONIDAZOLE 500 MG PO TABS
500.0000 mg | ORAL_TABLET | Freq: Two times a day (BID) | ORAL | 0 refills | Status: AC
Start: 2023-02-27 — End: 2023-03-06

## 2023-02-27 NOTE — Progress Notes (Signed)
Patient ID: [name], female    DOB: March 21, 1976  MRN: [id number]  CC: Follow-Up  Subjective: [name] is a 47 y.o. female who presents for follow-up.   Her concerns today include: - Reports Cardiology will no longer see her due to uninsured. Reports their office requires $160 out of pocket for self pay to be seen and she is currently unable to pay. Also, reports Cardiology will not refill her prescriptions. She does not complain of red flag symptoms such as but not limited to chest pain, shortness of breath, worst headache of life, nausea/vomiting.  - Reports she did not establish with Endocrinology due to uninsured. She is taking Metformin as prescribed. She denies red flag symptoms associated with diabetes.  - Reports she is up to date with diabetic eye exam. - Vaginal odor. She denies additional symptoms.  - States she plans to go to Social Services to see she can receive assistance with health insurance.  - No further issues/concerns for discussion today.    Patient Active Problem List   Diagnosis Date Noted   Iron deficiency anemia 11/24/2020   Exertional dyspnea 10/04/2020   Hypertensive urgency 10/04/2020   Left lumbar radiculitis 03/23/2019   Vitamin D deficiency disease 06/04/2018   Routine general medical examination at a health care facility 05/15/2017   Cervical cancer screening 05/15/2017   Dyslipidemia, goal LDL below 100 04/04/2017   Snoring 10/25/2016   Obesity (BMI 30-39.9) 03/09/2014   Diuretic-induced hypokalemia 02/13/2013   Diabetes mellitus without complication (HCC) 02/12/2013   Malignant hypertension 06/01/2009     Current Outpatient Medications on File Prior to Visit  Medication Sig Dispense Refill   levonorgestrel (MIRENA) 20 MCG/24HR IUD 1 Intra Uterine Device (1 each total) by Intrauterine route once. 1 each 0   Iron, Ferrous Sulfate, 325 (65 Fe) MG TABS Take 325 mg by mouth daily. 90 tablet 0   No current facility-administered  medications on file prior to visit.    No Known Allergies  Social History   Socioeconomic History   Marital status: Single    Spouse name: Not on file   Number of children: 2   Years of education: Not on file   Highest education level: Bachelor's degree (e.g., BA, AB, BS)  Occupational History    Employer: [employer]  Tobacco Use   Smoking status: Never    Passive exposure: Never   Smokeless tobacco: Never  Vaping Use   Vaping Use: Never used  Substance and Sexual Activity   Alcohol use: Yes   Drug use: No   Sexual activity: Yes    Birth control/protection: I.U.D.    Comment: Mirena IUD inserted 2012?,intercourse age 73, more than 5 sexual partners  Other Topics Concern   Not on file  Social History Narrative   Not on file   Social Determinants of Health   Financial Resource Strain: Medium Risk (02/26/2023)   Overall Financial Resource Strain (CARDIA)    Difficulty of Paying Living Expenses: Somewhat hard  Food Insecurity: Food Insecurity Present (02/26/2023)   Hunger Vital Sign    Worried About Running Out of Food in the Last Year: Sometimes true    Ran Out of Food in the Last Year: Sometimes true  Transportation Needs: No Transportation Needs (02/26/2023)   PRAPARE - Administrator, Civil Service (Medical): No    Lack of Transportation (Non-Medical): No  Physical Activity: Sufficiently Active (02/26/2023)   Exercise Vital Sign    Days of Exercise  per Week: 3 days    Minutes of Exercise per Session: 150+ min  Stress: Stress Concern Present (02/26/2023)   [employer] of Occupational Health - Occupational Stress Questionnaire    Feeling of Stress : To some extent  Social Connections: Moderately Isolated (02/26/2023)   Social Connection and Isolation Panel [NHANES]    Frequency of Communication with Friends and Family: Twice a week    Frequency of Social Gatherings with Friends and Family: Three times a week    Attends Religious Services: 1 to 4 times per year     Active Member of Clubs or Organizations: No    Attends Engineer, structural: Not on file    Marital Status: Never married  Catering manager Violence: Not on file    Family History  Problem Relation Age of Onset   Diabetes Mother    Heart disease Mother    Hypertension Maternal Aunt    Breast cancer Neg Hx     Past Surgical History:  Procedure Laterality Date   CESAREAN SECTION  2006    ROS: Review of Systems Negative except as stated above  PHYSICAL EXAM: BP (!) 183/144   Pulse 98   Temp 97.8 F (36.6 C) (Oral)   Ht 5\' 5"  (1.651 m)   Wt 228 lb 9.6 oz (103.7 kg)   SpO2 95%   BMI 38.04 kg/m   Physical Exam HENT:     Head: Normocephalic and atraumatic.     Nose: Nose normal.     Mouth/Throat:     Mouth: Mucous membranes are moist.     Pharynx: Oropharynx is clear.  Eyes:     Extraocular Movements: Extraocular movements intact.     Conjunctiva/sclera: Conjunctivae normal.     Pupils: Pupils are equal, round, and reactive to light.  Cardiovascular:     Rate and Rhythm: Normal rate and regular rhythm.     Pulses: Normal pulses.     Heart sounds: Normal heart sounds.  Pulmonary:     Effort: Pulmonary effort is normal.     Breath sounds: Normal breath sounds.  Musculoskeletal:        General: Normal range of motion.     Cervical back: Normal range of motion and neck supple.  Skin:    General: Skin is warm and dry.  Neurological:     General: No focal deficit present.     Mental Status: She is alert and oriented to person, place, and time.  Psychiatric:        Mood and Affect: Mood normal.        Behavior: Behavior normal.    Diabetic Foot Exam - Simple   Simple Foot Form Visual Inspection No deformities, no ulcerations, no other skin breakdown bilaterally: Yes Sensation Testing Intact to touch and monofilament testing bilaterally: Yes Pulse Check Posterior Tibialis and Dorsalis pulse intact bilaterally: Yes Comments     ASSESSMENT AND  PLAN: 1. Uncontrolled hypertension 2. Malignant hypertension 3. Chronic systolic heart failure (HCC) - Blood pressure not at goal during today's visit. Patient asymptomatic without chest pressure, chest pain, palpitations, shortness of breath, worst headache of life, and any additional red flag symptoms. - Hydralazine administered in office with no improvement of blood pressure.  - Resume Furosemide, Spironolactone, Carvedilol, and Dapagliflozin Propanediol as prescribed.  - Referral to Cardiology for further evaluation/management.  - Follow-up with clinical pharmacist in 1 day for blood pressure check. Write down your blood pressure readings each day and bring those results  along with your home blood pressure monitor to your appointment. Medications may be adjusted at that time if needed. - Follow-up with primary provider as scheduled.  - Ambulatory referral to Cardiology - hydrALAZINE (APRESOLINE) tablet 25 mg - furosemide (LASIX) 20 MG tablet; Take 1 tablet (20 mg total) by mouth daily.  Dispense: 90 tablet; Refill: 0 - spironolactone (ALDACTONE) 25 MG tablet; Take 1 tablet (25 mg total) by mouth at bedtime.  Dispense: 90 tablet; Refill: 0 - carvedilol (COREG) 12.5 MG tablet; Take 1.5 tablets (18.75 mg total) by mouth 2 (two) times daily with a meal.  Dispense: 270 tablet; Refill: 0 - dapagliflozin propanediol (FARXIGA) 10 MG TABS tablet; Take 1 tablet (10 mg total) by mouth daily before breakfast.  Dispense: 90 tablet; Refill: 0  4. Dyslipidemia, goal LDL below 100 - Resume Atorvastatin as prescribed.  - Routine screening.  - Referral to Cardiology for further evaluation/management.  - Follow-up with primary provider as scheduled.  - Ambulatory referral to Cardiology - Lipid panel - atorvastatin (LIPITOR) 20 MG tablet; Take 1 tablet (20 mg total) by mouth daily.  Dispense: 90 tablet; Refill: 0  5. Uncontrolled type 2 diabetes mellitus with hyperglycemia (HCC) - Hemoglobin A1c not at  goal at 8.1%,goal 7%. This is similar to previous 8.0%.  - Increase Metformin XR to from 1500 mg daily to 2000 mg daily.  - Continue Dapagliflozin Propanediol as prescribed. - Routine screening.  - Discussed the importance of healthy eating habits, low-carbohydrate diet, low-sugar diet, regular aerobic exercise (at least 150 minutes a week as tolerated) and medication compliance to achieve or maintain control of diabetes. - Referral to Endocrinology for further evaluation/management. During the interim follow-up with primary provider in 4 weeks or sooner if needed.  - Microalbumin / creatinine urine ratio - Basic Metabolic Panel - POCT glycosylated hemoglobin (Hb A1C) - Ambulatory referral to Endocrinology - metFORMIN (GLUCOPHAGE-XR) 500 MG 24 hr tablet; Take 2 tablets (1,000 mg total) by mouth 2 (two) times daily with a meal.  Dispense: 360 tablet; Refill: 0 - dapagliflozin propanediol (FARXIGA) 10 MG TABS tablet; Take 1 tablet (10 mg total) by mouth daily before breakfast.  Dispense: 90 tablet; Refill: 0  6. Diabetic eye exam [facility]) - Patient reports up to date.   7. Encounter for diabetic foot exam (HCC) - Completed today in office.   8. Vaginal odor - Empiric treatment for bacterial vaginosis with Metronidazole.  - Routine screening.  - Cervicovaginal ancillary only - POCT URINALYSIS DIP (CLINITEK) - metroNIDAZOLE (FLAGYL) 500 MG tablet; Take 1 tablet (500 mg total) by mouth 2 (two) times daily for 7 days.  Dispense: 14 tablet; Refill: 0  9. Financial difficulties - Patient given CAFA/[employer] application.   Patient was given the opportunity to ask questions.  Patient verbalized understanding of the plan and was able to repeat key elements of the plan. Patient was given clear instructions to go to Emergency Department or return to medical center if symptoms don't improve, worsen, or new problems develop.The patient verbalized understanding.   Orders Placed This Encounter   Procedures   Microalbumin / creatinine urine ratio   Basic Metabolic Panel   Lipid panel   Ambulatory referral to Endocrinology   Ambulatory referral to Cardiology   POCT glycosylated hemoglobin (Hb A1C)   POCT URINALYSIS DIP (CLINITEK)     Requested Prescriptions   Signed Prescriptions Disp Refills   atorvastatin (LIPITOR) 20 MG tablet 90 tablet 0    Sig: Take 1 tablet (20  mg total) by mouth daily.   furosemide (LASIX) 20 MG tablet 90 tablet 0    Sig: Take 1 tablet (20 mg total) by mouth daily.   metFORMIN (GLUCOPHAGE-XR) 500 MG 24 hr tablet 360 tablet 0    Sig: Take 2 tablets (1,000 mg total) by mouth 2 (two) times daily with a meal.   spironolactone (ALDACTONE) 25 MG tablet 90 tablet 0    Sig: Take 1 tablet (25 mg total) by mouth at bedtime.   carvedilol (COREG) 12.5 MG tablet 270 tablet 0    Sig: Take 1.5 tablets (18.75 mg total) by mouth 2 (two) times daily with a meal.   dapagliflozin propanediol (FARXIGA) 10 MG TABS tablet 90 tablet 0    Sig: Take 1 tablet (10 mg total) by mouth daily before breakfast.   metroNIDAZOLE (FLAGYL) 500 MG tablet 14 tablet 0    Sig: Take 1 tablet (500 mg total) by mouth 2 (two) times daily for 7 days.    Return in about 4 weeks (around 03/27/2023) for Follow-up 1 day blood pressure check with pharmacy, 4 weeks primary care, give CAFA app.  Rema Fendt, NP

## 2023-02-27 NOTE — Progress Notes (Signed)
Pt wants to speak about metformin refill.

## 2023-02-28 LAB — MICROALBUMIN / CREATININE URINE RATIO
Creatinine, Urine: 29.2 mg/dL
Microalb/Creat Ratio: 103 mg/g creat — ABNORMAL HIGH (ref 0–29)
Microalbumin, Urine: 30.1 ug/mL

## 2023-02-28 LAB — BASIC METABOLIC PANEL
BUN/Creatinine Ratio: 18 (ref 9–23)
BUN: 19 mg/dL (ref 6–24)
CO2: 25 mmol/L (ref 20–29)
Calcium: 10 mg/dL (ref 8.7–10.2)
Chloride: 100 mmol/L (ref 96–106)
Creatinine, Ser: 1.04 mg/dL — ABNORMAL HIGH (ref 0.57–1.00)
Glucose: 118 mg/dL — ABNORMAL HIGH (ref 70–99)
Potassium: 4.1 mmol/L (ref 3.5–5.2)
Sodium: 141 mmol/L (ref 134–144)
eGFR: 66 mL/min/{1.73_m2} (ref >59)

## 2023-02-28 LAB — LIPID PANEL
Chol/HDL Ratio: 2.6 ratio (ref 0.0–4.4)
Cholesterol, Total: 206 mg/dL — ABNORMAL HIGH (ref 100–199)
HDL: 78 mg/dL (ref >39)
LDL Chol Calc (NIH): 108 mg/dL — ABNORMAL HIGH (ref 0–99)
Triglycerides: 115 mg/dL (ref 0–149)
VLDL Cholesterol Cal: 20 mg/dL (ref 5–40)

## 2023-03-01 ENCOUNTER — Other Ambulatory Visit: Payer: Self-pay | Admitting: Family

## 2023-03-01 DIAGNOSIS — E785 Hyperlipidemia, unspecified: Secondary | ICD-10-CM

## 2023-03-01 MED ORDER — ATORVASTATIN CALCIUM 40 MG PO TABS
40.0000 mg | ORAL_TABLET | Freq: Every day | ORAL | 0 refills | Status: AC
Start: 2023-03-01 — End: 2023-05-30

## 2023-03-04 ENCOUNTER — Telehealth: Payer: Self-pay | Admitting: Family

## 2023-03-04 ENCOUNTER — Other Ambulatory Visit: Payer: Self-pay | Admitting: Family

## 2023-03-04 ENCOUNTER — Ambulatory Visit: Payer: Medicaid Other | Admitting: Pharmacist

## 2023-03-04 ENCOUNTER — Telehealth: Payer: Self-pay | Admitting: Pharmacist

## 2023-03-04 ENCOUNTER — Other Ambulatory Visit (HOSPITAL_COMMUNITY): Payer: Self-pay

## 2023-03-04 ENCOUNTER — Other Ambulatory Visit: Payer: Self-pay

## 2023-03-04 DIAGNOSIS — I5022 Chronic systolic (congestive) heart failure: Secondary | ICD-10-CM

## 2023-03-04 MED ORDER — EMPAGLIFLOZIN 10 MG PO TABS
10.0000 mg | ORAL_TABLET | Freq: Every day | ORAL | 0 refills | Status: DC
Start: 2023-03-04 — End: 2023-03-13
  Filled 2023-03-04 (×2): qty 90, 90d supply, fill #0

## 2023-03-04 NOTE — Telephone Encounter (Signed)
Hi [name],   Patient reports [name] cost too much out of pocket and requesting alternative. [name] was initially prescribed by Cardiology for management of heart failure and hypertension. Dr. Andrey Campanile recommended that I consult with you to see if you have any recommendations for [place].   Thank you

## 2023-03-04 NOTE — Telephone Encounter (Signed)
[name] initially prescribed by Cardiology for management of heart failure and hypertension. I am unsure of appropriate alternative. Dr. Andrey Campanile if you will please advise. Thank you.

## 2023-03-04 NOTE — Telephone Encounter (Signed)
I sent [name] a message. Thank you.

## 2023-03-04 NOTE — Telephone Encounter (Signed)
Thank you so much. I sent the Jardiance prescription to your office.

## 2023-03-04 NOTE — Telephone Encounter (Signed)
Call patient with update. Jardiance prescribed as alternate to [place]. Keep upcoming appointment with Primary Care or follow-up sooner if needed.

## 2023-03-04 NOTE — Telephone Encounter (Signed)
Copied from CRM [id number]. Topic: General - Other >> Mar 04, 2023  9:23 AM Franchot Heidelberg wrote: Reason for CRM: Pt called reporting that she has been prescribed medication that is too expensive. She says farxiga is not affordable, she is asking for a generic alternative please advise.

## 2023-03-04 NOTE — Telephone Encounter (Signed)
Hey friend,   This is not urgent! I requested  pt's PCP change [name] to Jardiance to see if her insurance would cover. The pharmacy here at [facility] ran a test claim and they show that she does not have coverage. Are you able to confirm she does not have coverage? I'm showing that she does on my end of Epic.

## 2023-03-04 NOTE — Telephone Encounter (Signed)
Called Pt left Vm to call office back

## 2023-03-05 ENCOUNTER — Other Ambulatory Visit (HOSPITAL_COMMUNITY): Payer: Self-pay

## 2023-03-11 ENCOUNTER — Other Ambulatory Visit: Payer: Self-pay

## 2023-03-13 ENCOUNTER — Other Ambulatory Visit: Payer: Self-pay

## 2023-03-13 ENCOUNTER — Other Ambulatory Visit: Payer: Self-pay | Admitting: Pharmacist

## 2023-03-13 DIAGNOSIS — E1165 Type 2 diabetes mellitus with hyperglycemia: Secondary | ICD-10-CM

## 2023-03-13 DIAGNOSIS — I5022 Chronic systolic (congestive) heart failure: Secondary | ICD-10-CM

## 2023-03-13 DIAGNOSIS — I10 Essential (primary) hypertension: Secondary | ICD-10-CM

## 2023-03-13 MED ORDER — DAPAGLIFLOZIN PROPANEDIOL 10 MG PO TABS
10.0000 mg | ORAL_TABLET | Freq: Every day | ORAL | 0 refills | Status: AC
Start: 2023-03-13 — End: ?
  Filled 2023-03-13: qty 90, 90d supply, fill #0
  Filled 2023-03-13: qty 30, 30d supply, fill #0

## 2023-03-14 ENCOUNTER — Other Ambulatory Visit: Payer: Self-pay

## 2023-03-20 ENCOUNTER — Other Ambulatory Visit: Payer: Self-pay

## 2023-03-22 ENCOUNTER — Other Ambulatory Visit: Payer: Self-pay | Admitting: Family

## 2023-03-22 DIAGNOSIS — A599 Trichomoniasis, unspecified: Secondary | ICD-10-CM

## 2023-03-22 DIAGNOSIS — A749 Chlamydial infection, unspecified: Secondary | ICD-10-CM | POA: Insufficient documentation

## 2023-03-22 MED ORDER — METRONIDAZOLE 500 MG PO TABS
500.0000 mg | ORAL_TABLET | Freq: Two times a day (BID) | ORAL | 0 refills | Status: AC
Start: 2023-03-22 — End: 2023-03-29

## 2023-03-22 MED ORDER — DOXYCYCLINE HYCLATE 100 MG PO TABS
100.0000 mg | ORAL_TABLET | Freq: Two times a day (BID) | ORAL | 0 refills | Status: AC
Start: 2023-03-22 — End: 2023-03-29

## 2023-03-27 ENCOUNTER — Ambulatory Visit: Payer: Medicaid Other | Admitting: Family

## 2023-04-10 ENCOUNTER — Ambulatory Visit: Payer: Medicaid Other | Admitting: Family

## 2023-04-23 ENCOUNTER — Encounter: Payer: Self-pay | Admitting: Pharmacist

## 2023-04-26 ENCOUNTER — Ambulatory Visit: Payer: Medicaid Other | Admitting: Family

## 2023-05-06 ENCOUNTER — Ambulatory Visit: Payer: Medicaid Other | Admitting: Internal Medicine

## 2023-05-08 ENCOUNTER — Encounter: Payer: Self-pay | Admitting: Pharmacist

## 2023-05-15 ENCOUNTER — Other Ambulatory Visit: Payer: Self-pay

## 2023-05-15 NOTE — Progress Notes (Addendum)
Kynsleigh Miu [birth year] [id number]  Patient outreached by Thomasene Ripple , PharmD Candidate on 05/15/23.Patient returned phone call.   Blood Pressure Readings: Last documented ambulatory systolic blood pressure: 183 Last documented ambulatory diastolic blood pressure: 144 Does the patient have a validated home blood pressure machine?: Yes (Pt reports not knowing where it is, has to find it)   Medication review was performed. Is the patient taking their medications as prescribed?: Yes   The following barriers to adherence were noted: Does the patient have cost concerns?: No Does the patient have transportation concerns?: No Does the patient need assistance obtaining refills?: No Does the patient occassionally forget to take some of their prescribed medications?: No Does the patient feel like one/some of their medications make them feel poorly?: No Does the patient have questions or concerns about their medications?: No Does the patient have a follow up scheduled with their primary care provider/cardiologist?: Yes   Interventions: Interventions Completed: Medications were reviewed, Patient was educated on proper technique to check home blood pressure and reminded to bring home machine and readings to next provider appointment, Patient was counseled on lifestyle modifications to improve blood pressure, including DASH diet and 150 min of exercise/week.  The patient has follow up scheduled:  PCP: Rema Fendt, NP   Thomasene Ripple, Student-PharmD

## 2023-05-20 ENCOUNTER — Ambulatory Visit (INDEPENDENT_AMBULATORY_CARE_PROVIDER_SITE_OTHER): Payer: Self-pay | Admitting: Family

## 2023-05-20 ENCOUNTER — Encounter: Payer: Self-pay | Admitting: Family

## 2023-05-20 VITALS — BP 132/92 | HR 84 | Temp 98.7°F | Ht 65.0 in | Wt 228.0 lb

## 2023-05-20 DIAGNOSIS — E1165 Type 2 diabetes mellitus with hyperglycemia: Secondary | ICD-10-CM

## 2023-05-20 DIAGNOSIS — Z7984 Long term (current) use of oral hypoglycemic drugs: Secondary | ICD-10-CM

## 2023-05-20 DIAGNOSIS — E785 Hyperlipidemia, unspecified: Secondary | ICD-10-CM

## 2023-05-20 DIAGNOSIS — I10 Essential (primary) hypertension: Secondary | ICD-10-CM

## 2023-05-20 DIAGNOSIS — I5022 Chronic systolic (congestive) heart failure: Secondary | ICD-10-CM

## 2023-05-20 LAB — POCT GLYCOSYLATED HEMOGLOBIN (HGB A1C): HbA1c, POC (controlled diabetic range): 8.7 % — AB (ref 0.0–7.0)

## 2023-05-20 MED ORDER — HYDRALAZINE HCL 10 MG PO TABS
25.0000 mg | ORAL_TABLET | Freq: Once | ORAL | Status: AC
Start: 2023-05-20 — End: 2023-05-20
  Administered 2023-05-20: 25 mg via ORAL

## 2023-05-20 MED ORDER — ATORVASTATIN CALCIUM 40 MG PO TABS: 40.0000 mg | ORAL_TABLET | Freq: Every day | ORAL | 0 refills | Status: DC

## 2023-05-20 MED ORDER — EMPAGLIFLOZIN 10 MG PO TABS
10.0000 mg | ORAL_TABLET | Freq: Every day | ORAL | 1 refills | Status: DC
Start: 2023-05-20 — End: 2023-06-03

## 2023-05-20 MED ORDER — METFORMIN HCL ER 500 MG PO TB24: ORAL_TABLET | ORAL | 0 refills | Status: DC

## 2023-05-20 MED ORDER — FUROSEMIDE 20 MG PO TABS
20.0000 mg | ORAL_TABLET | Freq: Every day | ORAL | 0 refills | Status: DC
Start: 2023-05-20 — End: 2023-06-03

## 2023-05-20 MED ORDER — DAPAGLIFLOZIN PROPANEDIOL 10 MG PO TABS: 10.0000 mg | ORAL_TABLET | Freq: Every day | ORAL | 0 refills | Status: DC

## 2023-05-20 MED ORDER — CARVEDILOL 12.5 MG PO TABS: 18.7500 mg | ORAL_TABLET | Freq: Two times a day (BID) | ORAL | 0 refills | Status: DC

## 2023-05-20 MED ORDER — SPIRONOLACTONE 25 MG PO TABS: 25.0000 mg | ORAL_TABLET | Freq: Every day | ORAL | 0 refills | Status: DC

## 2023-05-20 NOTE — Progress Notes (Signed)
Patient ID: [name], female    DOB: 1976/08/11  MRN: [id number]  CC: Chronic Conditions Follow-Up  Subjective: [name] is a 47 y.o. female who presents for chronic conditions follow-up.  Her concerns today include:  - Doing well on Furosemide, Spironolactone, Carvedilol, and Dapagliflozin Propanediol for high blood pressure, no issues/concerns. She does not complain of red flag symptoms such as but not limited to chest pain, shortness of breath, worst headache of life, nausea/vomiting. States she plans to reschedule Cardiology appointment soon. - Reports increased dose of Metformin XR 2000 mg daily for diabetes management causing diarrhea. Reports lower dose of Metformin XR 1500 mg daily did not cause diarrhea. Doing well on Dapagliflozin Propanediol for diabetes, no issues/concerns. States she is waiting on Social Services to approve health insurance application so that she can schedule an appointment with Endocrinology. - Doing well on Atorvastatin, no issues/concerns.   Patient Active Problem List   Diagnosis Date Noted   Trichomoniasis 03/22/2023   Chlamydia 03/22/2023   Iron deficiency anemia 11/24/2020   Exertional dyspnea 10/04/2020   Hypertensive urgency 10/04/2020   Left lumbar radiculitis 03/23/2019   Vitamin D deficiency disease 06/04/2018   Routine general medical examination at a health care facility 05/15/2017   Cervical cancer screening 05/15/2017   Dyslipidemia, goal LDL below 100 04/04/2017   Snoring 10/25/2016   Obesity (BMI 30-39.9) 03/09/2014   Diuretic-induced hypokalemia 02/13/2013   Diabetes mellitus without complication (HCC) 02/12/2013   Malignant hypertension 06/01/2009     Current Outpatient Medications on File Prior to Visit  Medication Sig Dispense Refill   levonorgestrel (MIRENA) 20 MCG/24HR IUD 1 Intra Uterine Device (1 each total) by Intrauterine route once. (Patient not taking: Reported on 05/20/2023) 1 each 0   Current  Facility-Administered Medications on File Prior to Visit  Medication Dose Route Frequency Provider Last Rate Last Admin   hydrALAZINE (APRESOLINE) tablet 25 mg  25 mg Oral Once Rema Fendt, NP        No Known Allergies  Social History   Socioeconomic History   Marital status: Single    Spouse name: Not on file   Number of children: 2   Years of education: Not on file   Highest education level: Bachelor's degree (e.g., BA, AB, BS)  Occupational History    Employer: [employer]  Tobacco Use   Smoking status: Never    Passive exposure: Never   Smokeless tobacco: Never  Vaping Use   Vaping status: Never Used  Substance and Sexual Activity   Alcohol use: Yes   Drug use: No   Sexual activity: Yes    Birth control/protection: I.U.D.    Comment: Mirena IUD inserted 2012?,intercourse age 58, more than 5 sexual partners  Other Topics Concern   Not on file  Social History Narrative   Not on file   Social Determinants of Health   Financial Resource Strain: Medium Risk (02/26/2023)   Overall Financial Resource Strain (CARDIA)    Difficulty of Paying Living Expenses: Somewhat hard  Food Insecurity: Food Insecurity Present (02/26/2023)   Hunger Vital Sign    Worried About Running Out of Food in the Last Year: Sometimes true    Ran Out of Food in the Last Year: Sometimes true  Transportation Needs: No Transportation Needs (02/26/2023)   PRAPARE - Administrator, Civil Service (Medical): No    Lack of Transportation (Non-Medical): No  Physical Activity: Sufficiently Active (02/26/2023)   Exercise Vital Sign  Days of Exercise per Week: 3 days    Minutes of Exercise per Session: 150+ min  Stress: Stress Concern Present (02/26/2023)   [employer] of Occupational Health - Occupational Stress Questionnaire    Feeling of Stress : To some extent  Social Connections: Moderately Isolated (02/26/2023)   Social Connection and Isolation Panel [NHANES]    Frequency of Communication  with Friends and Family: Twice a week    Frequency of Social Gatherings with Friends and Family: Three times a week    Attends Religious Services: 1 to 4 times per year    Active Member of Clubs or Organizations: No    Attends Engineer, structural: Not on file    Marital Status: Never married  Catering manager Violence: Not on file    Family History  Problem Relation Age of Onset   Diabetes Mother    Heart disease Mother    Hypertension Maternal Aunt    Breast cancer Neg Hx     Past Surgical History:  Procedure Laterality Date   CESAREAN SECTION  2006    ROS: Review of Systems Negative except as stated above  PHYSICAL EXAM: BP (!) 132/92   Pulse 84   Temp 98.7 F (37.1 C) (Oral)   Ht 5\' 5"  (1.651 m)   Wt 228 lb (103.4 kg)   SpO2 96%   BMI 37.94 kg/m   Physical Exam HENT:     Head: Normocephalic and atraumatic.     Nose: Nose normal.     Mouth/Throat:     Mouth: Mucous membranes are moist.     Pharynx: Oropharynx is clear.  Eyes:     Extraocular Movements: Extraocular movements intact.     Conjunctiva/sclera: Conjunctivae normal.     Pupils: Pupils are equal, round, and reactive to light.  Cardiovascular:     Rate and Rhythm: Normal rate and regular rhythm.     Pulses: Normal pulses.     Heart sounds: Normal heart sounds.  Pulmonary:     Effort: Pulmonary effort is normal.     Breath sounds: Normal breath sounds.  Musculoskeletal:        General: Normal range of motion.     Cervical back: Normal range of motion and neck supple.  Neurological:     General: No focal deficit present.     Mental Status: She is alert and oriented to person, place, and time.  Psychiatric:        Mood and Affect: Mood normal.        Behavior: Behavior normal.     ASSESSMENT AND PLAN: 1. Uncontrolled hypertension 2. Malignant hypertension 3. Chronic systolic heart failure (HCC) - Blood pressure not at goal during today's visit. Patient asymptomatic without chest  pressure, chest pain, palpitations, shortness of breath, worst headache of life, and any additional red flag symptoms. - Hydralazine administered in office. Blood pressure improved from 160/116, 157/116, to 132/92. - Continue Carvedilol, Dapagliflozin Propanediol, Furosemide, and Spironolactone as prescribed.  - Counseled on blood pressure goal of less than 130/80, low-sodium, DASH diet, medication compliance, and 150 minutes of moderate intensity exercise per week as tolerated. Counseled on medication adherence and adverse effects. - Keep all scheduled appointments with Cardiology.  - Follow-up with primary provider as scheduled. - hydrALAZINE (APRESOLINE) tablet 25 mg - carvedilol (COREG) 12.5 MG tablet; Take 1.5 tablets (18.75 mg total) by mouth 2 (two) times daily with a meal.  Dispense: 270 tablet; Refill: 0 - dapagliflozin propanediol (FARXIGA) 10 MG  TABS tablet; Take 1 tablet (10 mg total) by mouth daily before breakfast.  Dispense: 90 tablet; Refill: 0 - furosemide (LASIX) 20 MG tablet; Take 1 tablet (20 mg total) by mouth daily.  Dispense: 90 tablet; Refill: 0 - spironolactone (ALDACTONE) 25 MG tablet; Take 1 tablet (25 mg total) by mouth at bedtime.  Dispense: 90 tablet; Refill: 0  4. Uncontrolled type 2 diabetes mellitus with hyperglycemia (HCC) - Hemoglobin A1c above goal at 8.7%, goal 7%. - Decrease Metformin XR from 2000 mg daily to 1500 mg daily with goal decreasing gastrointestinal side effects.  - Continue Dapagliflozin Propanediol as prescribed.   -Trial Empagliflozin as prescribed. Counseled on medication adherence/adverse effects. - Discussed the importance of healthy eating habits, low-carbohydrate diet, low-sugar diet, regular aerobic exercise (at least 150 minutes a week as tolerated) and medication compliance to achieve or maintain control of diabetes. - Follow-up with clinical pharmacist in 4 weeks for diabetes checkup. Write your home blood sugar results down each day and  bring those results to your appointment along with your home glucose monitor. Medications may be revised at that time if needed. - Keep all scheduled appointments with Endocrinology.  - Follow-up with primary provider as scheduled.  - POCT glycosylated hemoglobin (Hb A1C) - dapagliflozin propanediol (FARXIGA) 10 MG TABS tablet; Take 1 tablet (10 mg total) by mouth daily before breakfast.  Dispense: 90 tablet; Refill: 0 - metFORMIN (GLUCOPHAGE-XR) 500 MG 24 hr tablet; Take 2 tablets (1,000 mg total) by mouth daily with breakfast AND 1 tablet (500 mg total) daily with supper.  Dispense: 270 tablet; Refill: 0 - empagliflozin (JARDIANCE) 10 MG TABS tablet; Take 1 tablet (10 mg total) by mouth daily before breakfast.  Dispense: 30 tablet; Refill: 1  5. Hyperlipidemia, unspecified hyperlipidemia type - Continue Atorvastatin as prescribed.  - Routine screening.  - Keep all scheduled appointments with Cardiology.  - Follow-up with primary provider as scheduled.  - Lipid panel - atorvastatin (LIPITOR) 40 MG tablet; Take 1 tablet (40 mg total) by mouth daily.  Dispense: 90 tablet; Refill: 0   Patient was given the opportunity to ask questions.  Patient verbalized understanding of the plan and was able to repeat key elements of the plan. Patient was given clear instructions to go to Emergency Department or return to medical center if symptoms don't improve, worsen, or new problems develop.The patient verbalized understanding.   Orders Placed This Encounter  Procedures   Lipid panel   POCT glycosylated hemoglobin (Hb A1C)     Requested Prescriptions   Signed Prescriptions Disp Refills   atorvastatin (LIPITOR) 40 MG tablet 90 tablet 0    Sig: Take 1 tablet (40 mg total) by mouth daily.   carvedilol (COREG) 12.5 MG tablet 270 tablet 0    Sig: Take 1.5 tablets (18.75 mg total) by mouth 2 (two) times daily with a meal.   dapagliflozin propanediol (FARXIGA) 10 MG TABS tablet 90 tablet 0    Sig:  Take 1 tablet (10 mg total) by mouth daily before breakfast.   furosemide (LASIX) 20 MG tablet 90 tablet 0    Sig: Take 1 tablet (20 mg total) by mouth daily.   metFORMIN (GLUCOPHAGE-XR) 500 MG 24 hr tablet 270 tablet 0    Sig: Take 2 tablets (1,000 mg total) by mouth daily with breakfast AND 1 tablet (500 mg total) daily with supper.   spironolactone (ALDACTONE) 25 MG tablet 90 tablet 0    Sig: Take 1 tablet (25 mg total) by mouth  at bedtime.   empagliflozin (JARDIANCE) 10 MG TABS tablet 30 tablet 1    Sig: Take 1 tablet (10 mg total) by mouth daily before breakfast.    Return in about 1 week (around 05/27/2023) for Follow-Up or next available chronic conditions with Butch Penny, RPH-CPP at [facility].  Rema Fendt, NP

## 2023-05-20 NOTE — Progress Notes (Signed)
ASPatient wants to speak about side effects of Metformin.   Patient decline Flu vaccine.

## 2023-05-21 LAB — LIPID PANEL
Chol/HDL Ratio: 2.9 {ratio} (ref 0.0–4.4)
Cholesterol, Total: 189 mg/dL (ref 100–199)
HDL: 65 mg/dL (ref >39)
LDL Chol Calc (NIH): 101 mg/dL — ABNORMAL HIGH (ref 0–99)
Triglycerides: 134 mg/dL (ref 0–149)
VLDL Cholesterol Cal: 23 mg/dL (ref 5–40)

## 2023-06-03 ENCOUNTER — Encounter: Payer: Self-pay | Admitting: Pharmacist

## 2023-06-03 ENCOUNTER — Ambulatory Visit: Payer: Self-pay | Attending: Family | Admitting: Pharmacist

## 2023-06-03 ENCOUNTER — Other Ambulatory Visit: Payer: Self-pay

## 2023-06-03 VITALS — BP 159/114 | HR 85

## 2023-06-03 DIAGNOSIS — E785 Hyperlipidemia, unspecified: Secondary | ICD-10-CM

## 2023-06-03 DIAGNOSIS — I5020 Unspecified systolic (congestive) heart failure: Secondary | ICD-10-CM | POA: Insufficient documentation

## 2023-06-03 DIAGNOSIS — I110 Hypertensive heart disease with heart failure: Secondary | ICD-10-CM | POA: Insufficient documentation

## 2023-06-03 DIAGNOSIS — Z91138 Patient's unintentional underdosing of medication regimen for other reason: Secondary | ICD-10-CM | POA: Insufficient documentation

## 2023-06-03 DIAGNOSIS — T447X6A Underdosing of beta-adrenoreceptor antagonists, initial encounter: Secondary | ICD-10-CM | POA: Insufficient documentation

## 2023-06-03 DIAGNOSIS — I5022 Chronic systolic (congestive) heart failure: Secondary | ICD-10-CM

## 2023-06-03 DIAGNOSIS — Z91141 Patient's other noncompliance with medication regimen due to financial hardship: Secondary | ICD-10-CM | POA: Insufficient documentation

## 2023-06-03 DIAGNOSIS — T383X6A Underdosing of insulin and oral hypoglycemic [antidiabetic] drugs, initial encounter: Secondary | ICD-10-CM | POA: Insufficient documentation

## 2023-06-03 DIAGNOSIS — Z7984 Long term (current) use of oral hypoglycemic drugs: Secondary | ICD-10-CM

## 2023-06-03 DIAGNOSIS — K521 Toxic gastroenteritis and colitis: Secondary | ICD-10-CM | POA: Insufficient documentation

## 2023-06-03 DIAGNOSIS — Z713 Dietary counseling and surveillance: Secondary | ICD-10-CM | POA: Insufficient documentation

## 2023-06-03 DIAGNOSIS — T383X5A Adverse effect of insulin and oral hypoglycemic [antidiabetic] drugs, initial encounter: Secondary | ICD-10-CM | POA: Insufficient documentation

## 2023-06-03 DIAGNOSIS — Z5971 Insufficient health insurance coverage: Secondary | ICD-10-CM | POA: Insufficient documentation

## 2023-06-03 DIAGNOSIS — I10 Essential (primary) hypertension: Secondary | ICD-10-CM

## 2023-06-03 DIAGNOSIS — E1165 Type 2 diabetes mellitus with hyperglycemia: Secondary | ICD-10-CM | POA: Insufficient documentation

## 2023-06-03 MED ORDER — VALSARTAN 80 MG PO TABS
80.0000 mg | ORAL_TABLET | Freq: Every day | ORAL | 1 refills | Status: DC
Start: 2023-06-03 — End: 2023-07-01
  Filled 2023-06-03: qty 30, 30d supply, fill #0
  Filled 2023-06-28 (×2): qty 30, 30d supply, fill #1

## 2023-06-03 MED ORDER — TRUE METRIX METER W/DEVICE KIT
1.0000 | PACK | 0 refills | Status: AC
  Filled 2023-06-03: qty 1, 1d supply, fill #0

## 2023-06-03 MED ORDER — TRULICITY 0.75 MG/0.5ML ~~LOC~~ SOAJ
0.7500 mg | SUBCUTANEOUS | 1 refills | Status: DC
Start: 2023-06-03 — End: 2023-07-01
  Filled 2023-06-03: qty 2, 28d supply, fill #0
  Filled 2023-06-28 (×2): qty 2, 28d supply, fill #1

## 2023-06-03 MED ORDER — FUROSEMIDE 20 MG PO TABS
20.0000 mg | ORAL_TABLET | Freq: Every day | ORAL | 0 refills | Status: DC
  Filled 2023-06-03 – 2023-06-28 (×3): qty 90, 90d supply, fill #0

## 2023-06-03 MED ORDER — ATORVASTATIN CALCIUM 40 MG PO TABS
40.0000 mg | ORAL_TABLET | Freq: Every day | ORAL | 0 refills | Status: DC
Start: 2023-06-03 — End: 2023-07-01
  Filled 2023-06-03 – 2023-06-28 (×3): qty 90, 90d supply, fill #0

## 2023-06-03 MED ORDER — CARVEDILOL 12.5 MG PO TABS
12.5000 mg | ORAL_TABLET | Freq: Two times a day (BID) | ORAL | 0 refills | Status: DC
  Filled 2023-06-03 – 2023-06-28 (×2): qty 180, 90d supply, fill #0
  Filled 2023-06-28: qty 270, 135d supply, fill #0
  Filled 2023-10-04 – 2023-10-21 (×2): qty 90, 45d supply, fill #1

## 2023-06-03 MED ORDER — TRUE METRIX BLOOD GLUCOSE TEST VI STRP
ORAL_STRIP | 12 refills | Status: AC
  Filled 2023-06-03: qty 100, 90d supply, fill #0

## 2023-06-03 MED ORDER — METFORMIN HCL ER 500 MG PO TB24
1500.0000 mg | ORAL_TABLET | Freq: Every day | ORAL | 0 refills | Status: DC
Start: 2023-06-03 — End: 2023-10-21
  Filled 2023-06-03 – 2023-06-28 (×3): qty 270, 90d supply, fill #0

## 2023-06-03 MED ORDER — SPIRONOLACTONE 25 MG PO TABS
25.0000 mg | ORAL_TABLET | Freq: Every day | ORAL | 0 refills | Status: DC
  Filled 2023-06-03 – 2023-06-28 (×3): qty 90, 90d supply, fill #0

## 2023-06-03 MED ORDER — TRUEPLUS LANCETS 28G MISC
1.0000 | Freq: Every day | 2 refills | Status: AC
  Filled 2023-06-03: qty 100, 90d supply, fill #0

## 2023-06-03 NOTE — Progress Notes (Signed)
S:     No chief complaint on file.  47 y.o. female who presents for diabetes evaluation, education, and management.   Patient was referred and last seen by Primary Care Provider, Ricky Stabs, on 05/20/23.   PMH is significant for T2DM, HTN, HFrEF. At last visit, BP was elevated, 160/116. Hydralazine administered in clinic. A1c 8.7% above goal, but was having diarrhea with metformin. She was counseled on diet and lifestyle and metformin dose was decreased.  Today, patient presents to clinic in good spirits and without assistance. Since decreasing metformin dose, she is still having about 2 episodes of diarrhea per day. Has not been taking [place] d/t cost. Reports she does not have diabetic testing supplies at home and has not been checking BG. Reports she does have a home BP cuff, but is unsure where it is. Notes that her BP is always high. Denies lightheadedness, dizziness, LE swelling, SOB. Reports Sherryll Burger, Tribenzor, and Raynelle Chary were discontinued in the past d/t cost. Could not recall any side effects to other antihypertensives in the past. She is working on gathering information needed for her Medicaid application. She denies personal or family hx of thyroid cancer and hx of pancreatitis.  Patient reports Diabetes was diagnosed 4-5 years ago.   Family/Social History:  Current alcohol use  Current diabetes medications include: metformin XR 1500 mg in AM, Farxiga 10 mg daily (not taking) Current hypertension medications include: carvedilol 12.5 mg - 1.5 tabs BID (taking 1.5 tab once daily), spironolactone 25 mg daily, furosemide 20 mg daily Current hyperlipidemia medications include: atorvastatin 40 mg daily  Antihypertensives previously tried: Theme park manager, Kendal Hymen (discontinued d/t cost), losartan, olmesartan, telmisartan (pt could not recall why these were discontinued)  Patient reports adherence to metformin, spironolactone, and furosemide. Although she has not been taking  [place] d/t cost and has only been taking carvedilol 12.5 mg -1.5 tablets once daily because she misunderstood directions.  Insurance coverage: Family planning Medicaid  Patient denies hypoglycemic events.  Reported home fasting blood sugars: not checking  Reported 2 hour post-meal/random blood sugars: not checking.  Patient denies nocturia (nighttime urination).  Patient reports polydipsia. Patient denies neuropathy (nerve pain). Patient denies visual changes. Patient reports self foot exams.   Patient reported dietary habits:  - not discussed today  Patient-reported exercise habits: dance class 3x/week for 3 hrs each time, works at [employer] which is a physical job  O:   ROS  Physical Exam  Lab Results  Component Value Date   HGBA1C 8.7 (A) 05/20/2023   There were no vitals filed for this visit.  Lipid Panel     Component Value Date/Time   CHOL 189 05/20/2023 1221   TRIG 134 05/20/2023 1221   HDL 65 05/20/2023 1221   CHOLHDL 2.9 05/20/2023 1221   CHOLHDL 3 03/23/2019 0852   VLDL 17.8 03/23/2019 0852   LDLCALC 101 (H) 05/20/2023 1221    Clinical Atherosclerotic Cardiovascular Disease (ASCVD): No  The 10-year ASCVD risk score (Arnett DK, et al., 2019) is: 6.3%   Values used to calculate the score:     Age: 44 years     Sex: Female     Is Non-Hispanic African American: Yes     Diabetic: Yes     Tobacco smoker: No     Systolic Blood Pressure: 132 mmHg     Is BP treated: Yes     HDL Cholesterol: 65 mg/dL     Total Cholesterol: 189 mg/dL   Patient is participating in  a Managed Medicaid Plan:  no  A/P: Diabetes longstanding currently uncontrolled based on last A1c 8.7%. Patient is able to verbalize appropriate hypoglycemia management plan. Medication adherence appears optimal. Control is suboptimal due to need for additional therapy. Will start Trulicity for additional BG control and weight loss benefit. She denied personal or family hx of thyroid cancer and hx of  pancreatitis. Discussed further decreasing metformin dose to 1000 mg daily given she is still experiencing some diarrhea. Ultimately, opted to continue at current dose per patient preference.  -Started GLP-1 Trulicity (dulaglutide) 0.75 mg weekly.  -Continued metformin XR 1500 mg daily. -Discontinued Farxiga since she is not taking anymore d/t to cost -Provided RX for diabetic testing supplies and counseled her to check FBG once daily in the morning. -Patient educated on purpose, proper use, and potential adverse effects of Trulicity and metformin .  -Extensively discussed pathophysiology of diabetes, recommended lifestyle interventions, dietary effects on blood sugar control.  -Counseled on s/sx of and management of hypoglycemia.  -Next A1c anticipated 08/19/23.   ASCVD risk - primary prevention in patient with diabetes. Last LDL is 101 not at goal of <81 mg/dL. ASCVD risk factors include T2DM, HTN and 10-year ASCVD risk score of 6.3%. moderate intensity statin indicated.  -Continued atorvastatin 40 mg.   HFrEF - Hypertension longstanding currently uncontrolled with room to optimize HF GDMT today. Blood pressure goal of <130/80 mmHg. Medication adherence reported. Control is suboptimal due to need for additional therapy. Counseled on proper administration of carvedilol twice daily instead of once daily. Additionally, since we are starting valsartan today decreased carvedilol to 1 tablet twice a day in order to simplify medication regimen and avoid cutting tablets. -Started valsartan 80 mg daily -Instructed her to take carvedilol 12.5 mg twice daily -Continued spironolactone 25 mg. -Continued furosemide 20 mg daily -Counseled her to check BP at home and record readings to discuss at next visit -BMP today- instructed her that we will reach out if any medication changes are needed based on results  All medications reordered to [facility] to assist with cost. Encouraged her to  continue working on [facility] application.  Written patient instructions provided. Patient verbalized understanding of treatment plan.   Total time in face to face counseling 30 minutes.    Follow-up:  Pharmacist in 3-4 weeks   Jarrett Ables, PharmD PGY-1 Pharmacy Resident

## 2023-06-04 LAB — BASIC METABOLIC PANEL
BUN/Creatinine Ratio: 13 (ref 9–23)
BUN: 12 mg/dL (ref 6–24)
CO2: 25 mmol/L (ref 20–29)
Calcium: 9.5 mg/dL (ref 8.7–10.2)
Chloride: 103 mmol/L (ref 96–106)
Creatinine, Ser: 0.94 mg/dL (ref 0.57–1.00)
Glucose: 110 mg/dL — ABNORMAL HIGH (ref 70–99)
Potassium: 4.1 mmol/L (ref 3.5–5.2)
Sodium: 142 mmol/L (ref 134–144)
eGFR: 74 mL/min/{1.73_m2} (ref >59)

## 2023-06-28 ENCOUNTER — Other Ambulatory Visit (HOSPITAL_COMMUNITY): Payer: Self-pay

## 2023-06-28 ENCOUNTER — Other Ambulatory Visit: Payer: Self-pay

## 2023-06-30 NOTE — Progress Notes (Unsigned)
S:     47 y.o. female who presents for diabetes evaluation, education, and management. PMH is significant for T2DM, HTN, HFrEF. Patient was referred and last seen by Primary Care Provider, Ricky Stabs, on 05/20/2023. At that visit A1C was 8.7 %. At that visit patient endorsed diarrhea with metformin so the dose was decreased. Patient was last seen by Pharmacist, Jarrett Ables, on 06/03/2023. At that visit patient was started on Trulicity (dulaglutide) 0.75 mg weekly and metformin was continued (pt was still experinecing diarrhea but wanted to continue on the same dose).   Today, patient arrives in good spirits and presents without any assistance. She is tolerating metformin XR 1500 mg in AM. She is tolerating Trulicity. Reports she does have diabetic testing supplies at home but states it shows an error message when she tries to use it, so she has no home readings. Reports she does not have a home BP cuff. Denies lightheadedness, dizziness, headache, or tingling sensation in hands or feet. Patient was She is working on gathering information needed for her Medicaid application. Insurance is currently a barrier (unable to go to cardiology due to expenses). I told the patient about the medicaid office upstairs, patient is planning to go there today.   Patient reported at last visit with pharmacist, Diabetes was diagnosed 4-5 years ago.   Family/Social History:  Diabetes (mother) Heart disease (mother),   Current diabetes medications include: metformin XR 1500 mg in AM, Trulicity (dulaglutide) 0.75 mg weekly Current hypertension medications include: carvedilol 12.5 mg twice daily, spironolactone 25 mg daily, furosemide 20 mg daily  Current hyperlipidemia medications include: atorvastatin 40 mg daily   Patient reports adherence to taking all medications as prescribed.   Insurance coverage: Family planning Medicaid   Patient denies hypoglycemic events.  Reported home fasting blood sugars: no   Reported 2 hour post-meal/random blood sugars: no.  Patient denies nocturia (nighttime urination).  Patient denies neuropathy (nerve pain). Patient denies visual changes. Patient denies self foot exams.   Patient reported dietary habits:  Pt admits to dietary indiscretion.  Eats 2 meals/day.  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  Lipid Panel     Component Value Date/Time   CHOL 189 05/20/2023 1221   TRIG 134 05/20/2023 1221   HDL 65 05/20/2023 1221   CHOLHDL 2.9 05/20/2023 1221   CHOLHDL 3 03/23/2019 0852   VLDL 17.8 03/23/2019 0852   LDLCALC 101 (H) 05/20/2023 1221    Clinical Atherosclerotic Cardiovascular Disease (ASCVD): No  The 10-year ASCVD risk score (Arnett DK, et al., 2019) is: 13.4%   Values used to calculate the score:     Age: 37 years     Sex: Female     Is Non-Hispanic African American: Yes     Diabetic: Yes     Tobacco smoker: No     Systolic Blood Pressure: 159 mmHg     Is BP treated: Yes     HDL Cholesterol: 65 mg/dL     Total Cholesterol: 189 mg/dL   A/P: Diabetes longstanding currently uncontrolled. Patient is able to verbalize appropriate hypoglycemia management plan. Medication adherence appears optimal. Control  is suboptimal due to . -Increased dose of GLP-1 Trulicity (dulaglutide) to 1.5 mg once weekly.  -Continued metformin XR 1500 mg daily. -Patient educated on purpose, proper use, and potential adverse effects of medications.  -Extensively discussed pathophysiology of diabetes, recommended lifestyle interventions, dietary effects on blood sugar control.  -Counseled on s/sx of and management of  hypoglycemia.  -Next A1c anticipated 08/19/23. -Will obtain UACR at that visit as well.   ASCVD risk - primary prevention in patient with diabetes. Last LDL is 101 not at goal of <21  mg/dL. ASCVD risk factors include T2DM, HTN, and 10-year ASCVD risk score of 13.4%.  -Continued atorvastatin 40 mg. -Prescription was sent to the pharmacy downstairs. -Patient was informed to stop the atorvastatin 20 mg that she previously picked up from [facility]. Patient was unsure why she was taking atorvastatin 20 mg instead of atorvastatin 40 mg.  Hypertension longstanding currently uncontrolled. Blood pressure goal of <130/80  mmHg. Today, BP was 157/109 with a pulse of 86. BP on repeat was 159/111 with a  pulse of 87. Medication adherence reported. Blood pressure control is suboptimal due to medications not fully optimized. -Increased Valsartan to 160 mg daily (prescription sent to pharmacy downstairs) -Continued carvedilol 12.5 mg twice daily  -Continued spironolactone 25 mg. -Continued furosemide 20 mg daily -BMP today  Written patient instructions provided. Patient verbalized understanding of treatment plan.  Total time in face to face counseling 30 minutes.    Follow-up:  Pharmacist: 08/05/2023 PCP clinic visit: Not scheduled  Patient seen with:  Erasmo Leventhal, PharmD Candidate  Class of 2025 [employer] [facility] SOP   Butch Penny, PharmD, [place], CPP Clinical Pharmacist [facility] [phone]

## 2023-07-01 ENCOUNTER — Encounter: Payer: Self-pay | Admitting: Pharmacist

## 2023-07-01 ENCOUNTER — Other Ambulatory Visit: Payer: Self-pay

## 2023-07-01 ENCOUNTER — Ambulatory Visit: Payer: Self-pay | Attending: Family | Admitting: Pharmacist

## 2023-07-01 VITALS — BP 159/111 | HR 87

## 2023-07-01 DIAGNOSIS — I5022 Chronic systolic (congestive) heart failure: Secondary | ICD-10-CM | POA: Insufficient documentation

## 2023-07-01 DIAGNOSIS — Z79899 Other long term (current) drug therapy: Secondary | ICD-10-CM | POA: Insufficient documentation

## 2023-07-01 DIAGNOSIS — Z7984 Long term (current) use of oral hypoglycemic drugs: Secondary | ICD-10-CM | POA: Insufficient documentation

## 2023-07-01 DIAGNOSIS — E785 Hyperlipidemia, unspecified: Secondary | ICD-10-CM | POA: Insufficient documentation

## 2023-07-01 DIAGNOSIS — I2510 Atherosclerotic heart disease of native coronary artery without angina pectoris: Secondary | ICD-10-CM | POA: Insufficient documentation

## 2023-07-01 DIAGNOSIS — I110 Hypertensive heart disease with heart failure: Secondary | ICD-10-CM | POA: Insufficient documentation

## 2023-07-01 DIAGNOSIS — I10 Essential (primary) hypertension: Secondary | ICD-10-CM

## 2023-07-01 DIAGNOSIS — E1165 Type 2 diabetes mellitus with hyperglycemia: Secondary | ICD-10-CM | POA: Insufficient documentation

## 2023-07-01 MED ORDER — ATORVASTATIN CALCIUM 40 MG PO TABS
40.0000 mg | ORAL_TABLET | Freq: Every day | ORAL | 0 refills | Status: DC
  Filled 2023-10-04: qty 30, 30d supply, fill #0
  Filled 2023-10-21: qty 90, 90d supply, fill #0

## 2023-07-01 MED ORDER — TRULICITY 1.5 MG/0.5ML ~~LOC~~ SOAJ
1.5000 mg | SUBCUTANEOUS | 6 refills | Status: DC
Start: 2023-07-01 — End: 2024-02-17
  Filled 2023-07-01: qty 2, 28d supply, fill #0
  Filled 2023-08-05 (×2): qty 2, 28d supply, fill #1
  Filled 2023-09-01 – 2023-09-02 (×2): qty 2, 28d supply, fill #2
  Filled 2023-09-24 – 2023-10-21 (×5): qty 2, 28d supply, fill #3
  Filled 2023-11-14 – 2023-11-15 (×3): qty 2, 28d supply, fill #4
  Filled 2023-12-09: qty 2, 28d supply, fill #5
  Filled 2024-01-13: qty 2, 28d supply, fill #6

## 2023-07-01 MED ORDER — VALSARTAN 160 MG PO TABS
160.0000 mg | ORAL_TABLET | Freq: Every day | ORAL | 3 refills | Status: DC
Start: 2023-07-01 — End: 2024-05-12
  Filled 2023-07-01: qty 90, 90d supply, fill #0
  Filled 2023-10-04: qty 30, 30d supply, fill #1
  Filled 2023-10-21 (×2): qty 90, 90d supply, fill #1
  Filled 2024-01-15: qty 90, 90d supply, fill #2
  Filled 2024-04-30: qty 90, 90d supply, fill #3

## 2023-07-02 LAB — BMP8+EGFR
BUN/Creatinine Ratio: 14 (ref 9–23)
BUN: 14 mg/dL (ref 6–24)
CO2: 26 mmol/L (ref 20–29)
Calcium: 9.6 mg/dL (ref 8.7–10.2)
Chloride: 103 mmol/L (ref 96–106)
Creatinine, Ser: 1.03 mg/dL — ABNORMAL HIGH (ref 0.57–1.00)
Glucose: 108 mg/dL — ABNORMAL HIGH (ref 70–99)
Potassium: 4.4 mmol/L (ref 3.5–5.2)
Sodium: 142 mmol/L (ref 134–144)
eGFR: 67 mL/min/{1.73_m2} (ref >59)

## 2023-08-05 ENCOUNTER — Encounter: Payer: Self-pay | Admitting: Pharmacist

## 2023-08-05 ENCOUNTER — Ambulatory Visit: Payer: Self-pay | Attending: Family Medicine | Admitting: Pharmacist

## 2023-08-05 ENCOUNTER — Other Ambulatory Visit: Payer: Self-pay

## 2023-08-05 VITALS — BP 128/83 | HR 85

## 2023-08-05 DIAGNOSIS — E119 Type 2 diabetes mellitus without complications: Secondary | ICD-10-CM

## 2023-08-05 DIAGNOSIS — I10 Essential (primary) hypertension: Secondary | ICD-10-CM

## 2023-08-05 DIAGNOSIS — Z7984 Long term (current) use of oral hypoglycemic drugs: Secondary | ICD-10-CM

## 2023-08-05 DIAGNOSIS — Z7985 Long-term (current) use of injectable non-insulin antidiabetic drugs: Secondary | ICD-10-CM

## 2023-08-05 LAB — POCT GLYCOSYLATED HEMOGLOBIN (HGB A1C): HbA1c, POC (controlled diabetic range): 6.9 % (ref 0.0–7.0)

## 2023-08-05 NOTE — Progress Notes (Cosign Needed)
S:     47 y.o. female who presents for diabetes evaluation, education, and management. PMH is significant for T2DM, HTN, HFrEF. Patient was referred and last seen by Primary Care Provider, Ricky Stabs, on 05/20/2023. At that visit A1C was 8.7 %. We last saw her on 11/11. At that visit we increased the dose of her Trulicity to 1.5 mg weekly. Additionally, we increased valsartan to 160 mg daily.   Today, patient arrives in good spirits and presents without any assistance. She is tolerating metformin okay. Still endorsing occasional diarrhea but this is manageable. She is tolerating Trulicity. Denies any NV, abdominal pain. Reports she does have diabetic testing supplies at home but states it shows an error message when she tries to use it, so she has no home readings.   Regarding her blood pressure, she reports she does not have a home BP cuff. Denies lightheadedness, dizziness, headache. She is adherent to her antihypertensives.   Family/Social History:  Diabetes (mother) Heart disease (mother),   Current diabetes medications include: metformin XR 1500 mg in AM, Trulicity (dulaglutide) 1.5 mg weekly Current hypertension medications include: carvedilol 12.5 mg twice daily, spironolactone 25 mg daily, furosemide 20 mg daily, valsartan 160 mg daily  Current hyperlipidemia medications include: atorvastatin 40 mg daily   Patient reports adherence to taking all medications as prescribed.   Insurance coverage: none  Patient denies hypoglycemic events.  Reported home fasting blood sugars: none  Reported 2 hour post-meal/random blood sugars: none. Reports error message when she tries to use her glucometer.   Patient denies nocturia (nighttime urination).  Patient denies neuropathy (nerve pain). Patient denies visual changes. Patient denies self foot exams.   Patient reported dietary habits:  Pt admits to dietary indiscretion.  Eats 2 meals/day.  Breakfast: eggs, pancakes,  sausage,toast Dinner: chicken, hamburger, pizza Drinks: water, apple juice, orange juice, tea  At last visit with Pharmacist, patient-reported exercise habits: dance class 3x/week for 3 hrs each time, works at [employer] which is a physical job   O:   Lab Results  Component Value Date   HGBA1C 6.9 08/05/2023   Vitals:   08/05/23 1133  BP: 128/83  Pulse: 85    Lipid Panel     Component Value Date/Time   CHOL 189 05/20/2023 1221   TRIG 134 05/20/2023 1221   HDL 65 05/20/2023 1221   CHOLHDL 2.9 05/20/2023 1221   CHOLHDL 3 03/23/2019 0852   VLDL 17.8 03/23/2019 0852   LDLCALC 101 (H) 05/20/2023 1221    Clinical Atherosclerotic Cardiovascular Disease (ASCVD): No  The 10-year ASCVD risk score (Arnett DK, et al., 2019) is: 5.6%   Values used to calculate the score:     Age: 38 years     Sex: Female     Is Non-Hispanic African American: Yes     Diabetic: Yes     Tobacco smoker: No     Systolic Blood Pressure: 128 mmHg     Is BP treated: Yes     HDL Cholesterol: 65 mg/dL     Total Cholesterol: 189 mg/dL   A/P: Diabetes longstanding currently at goal. A1c today is 6.9% - commended her for this! Patient is able to verbalize appropriate hypoglycemia management plan. Medication adherence appears optimal. -Continue Trulicity (dulaglutide) 1.5 mg once weekly.  -Continued metformin XR 1500 mg daily. -Patient educated on purpose, proper use, and potential adverse effects of medications.  -Extensively discussed pathophysiology of diabetes, recommended lifestyle interventions, dietary effects on blood sugar control.  -  Counseled on s/sx of and management of hypoglycemia.  -Next A1c anticipated 10/2023.  Hypertension longstanding currently close to goal. Blood pressure goal of <130/80  mmHg. Medication adherence reported.  -Continued valsartan 160 mg daily -Continued carvedilol 12.5 mg twice daily  -Continued spironolactone 25 mg. -Continued furosemide 20 mg daily  Written patient  instructions provided. Patient verbalized understanding of treatment plan.  Total time in face to face counseling 30 minutes.    Follow-up:  Pharmacist: 09/30/2023  Butch Penny, PharmD, BCACP, CPP Clinical Pharmacist [facility] & [facility] [phone]

## 2023-09-02 ENCOUNTER — Other Ambulatory Visit: Payer: Self-pay

## 2023-09-04 ENCOUNTER — Other Ambulatory Visit: Payer: Self-pay

## 2023-09-24 ENCOUNTER — Other Ambulatory Visit: Payer: Self-pay

## 2023-09-27 NOTE — Progress Notes (Unsigned)
 S:     48 y.o. female who presents for diabetes evaluation, education, and management. PMH is significant for T2DM, HTN, HFrEF.  Patient was referred and last seen by Primary Care Provider, [name], on 05/20/2023. At that visit A1C was 8.7 %.   We last saw her on 08/05/2023. A1c was 6.9. Endorsed occasional diarrhea with metformin . Denies N/V with Trulicity . Reported not having BP cuff at home.  Family/Social History:  Fhx: Diabetes (mother) Heart disease (mother)    - Current diabetes medications include: metformin  XR 1500 mg in AM, Trulicity  (dulaglutide ) 1.5 mg weekly - Current hypertension medications include: carvedilol  12.5 mg twice daily, spironolactone  25 mg daily, furosemide  20 mg daily, valsartan  160 mg daily  - Current hyperlipidemia medications include: atorvastatin  40 mg daily   Patient reports adherence to taking all medications as prescribed.  *** Patient denies adherence with medications, reports missing *** medications *** times per week, on average.  Insurance coverage: none  Patient denies hypoglycemic events.  Reported home fasting blood sugars: none  Reported 2 hour post-meal/random blood sugars: none. Reports error message when she tries to use her glucometer.   Patient {Actions; denies-reports:120008} hypoglycemic events.  Reported home fasting blood sugars: ***  Reported 2 hour post-meal/random blood sugars: ***.  Patient {Actions; denies-reports:120008} nocturia (nighttime urination).  Patient {Actions; denies-reports:120008} neuropathy (nerve pain). Patient {Actions; denies-reports:120008} visual changes. Patient {Actions; denies-reports:120008} self foot exams.   Patient reported dietary habits: *** Pt admits to dietary indiscretion.  Eats 2 meals/day.  Breakfast: eggs, pancakes, sausage,toast *** Dinner: chicken, hamburger, pizza*** Drinks: water, apple juice, orange juice, tea***  Patient-reported exercise habits: dance class 3x/week  for 3 hrs each time, works at [employer] which is a physical job ***   O:   Lab Results  Component Value Date   HGBA1C 6.9 08/05/2023   There were no vitals filed for this visit.   Lipid Panel     Component Value Date/Time   CHOL 189 05/20/2023 1221   TRIG 134 05/20/2023 1221   HDL 65 05/20/2023 1221   CHOLHDL 2.9 05/20/2023 1221   CHOLHDL 3 03/23/2019 0852   VLDL 17.8 03/23/2019 0852   LDLCALC 101 (H) 05/20/2023 1221    Clinical Atherosclerotic Cardiovascular Disease (ASCVD): No  The 10-year ASCVD risk score (Arnett DK, et al., 2019) is: 5.6%   Values used to calculate the score:     Age: 48 years     Sex: Female     Is Non-Hispanic African American: Yes     Diabetic: Yes     Tobacco smoker: No     Systolic Blood Pressure: 128 mmHg     Is BP treated: Yes     HDL Cholesterol: 65 mg/dL     Total Cholesterol: 189 mg/dL   A/P: Diabetes longstanding currently at goal with A1c 6.9. Patient is able to verbalize appropriate hypoglycemia management plan. Medication adherence appears optimal. -{Meds adjust:18428} Trulicity  (dulaglutide ) 1.5 mg once weekly.  -{Meds adjust:18428} metformin  XR 1500 mg daily. -Patient educated on purpose, proper use, and potential adverse effects of medications.  -Extensively discussed pathophysiology of diabetes, recommended lifestyle interventions, dietary effects on blood sugar control.  -Counseled on s/sx of and management of hypoglycemia.  -Next A1c anticipated 10/2023.  Hypertension longstanding currently close to goal. Blood pressure goal of <130/80  mmHg. Medication adherence reported.  -{Meds adjust:18428} valsartan  160 mg daily -{Meds adjust:18428} carvedilol  12.5 mg twice daily  -{Meds adjust:18428} spironolactone  25 mg. -{Meds adjust:18428} furosemide  20 mg  daily  Written patient instructions provided. Patient verbalized understanding of treatment plan.   Total time in face to face *** 30 minutes.    Follow-up:  Pharmacist *** PCP  clinic visit in ***  Patient seen with ***  Jearlean Mince, PharmD Candidate [employer]  Class of 2027  Marene Shape, PharmD, [place], CPP Clinical Pharmacist [facility] [phone]

## 2023-09-30 ENCOUNTER — Ambulatory Visit: Payer: Medicaid Other | Admitting: Pharmacist

## 2023-10-04 ENCOUNTER — Other Ambulatory Visit: Payer: Self-pay | Admitting: Family

## 2023-10-04 ENCOUNTER — Other Ambulatory Visit: Payer: Self-pay

## 2023-10-04 DIAGNOSIS — I10 Essential (primary) hypertension: Secondary | ICD-10-CM

## 2023-10-04 DIAGNOSIS — I5022 Chronic systolic (congestive) heart failure: Secondary | ICD-10-CM

## 2023-10-04 NOTE — Telephone Encounter (Signed)
Requested medication (s) are due for refill today: yes   Requested medication (s) are on the active medication list: yes   Last refill:  06/03/23 #90 0 refills   Future visit scheduled: no   Notes to clinic:  no refills remain. Do you want to refill Rx?     Requested Prescriptions  Pending Prescriptions Disp Refills   spironolactone (ALDACTONE) 25 MG tablet 90 tablet 0    Sig: Take 1 tablet (25 mg total) by mouth at bedtime.     Cardiovascular: Diuretics - Aldosterone Antagonist Failed - 10/04/2023  3:35 PM      Failed - Cr in normal range and within 180 days    Creatinine, Ser  Date Value Ref Range Status  07/01/2023 1.03 (H) 0.57 - 1.00 mg/dL Final   Creatinine,U  Date Value Ref Range Status  06/04/2018 134.8 mg/dL Final         Passed - K in normal range and within 180 days    Potassium  Date Value Ref Range Status  07/01/2023 4.4 3.5 - 5.2 mmol/L Final         Passed - Na in normal range and within 180 days    Sodium  Date Value Ref Range Status  07/01/2023 142 134 - 144 mmol/L Final         Passed - eGFR is 30 or above and within 180 days    GFR calc Af Amer  Date Value Ref Range Status  04/04/2017 >60 >60 mL/min Final    Comment:    (NOTE) The eGFR has been calculated using the CKD EPI equation. This calculation has not been validated in all clinical situations. eGFR's persistently <60 mL/min signify possible Chronic Kidney Disease.    GFR, Estimated  Date Value Ref Range Status  04/04/2021 >60 >60 mL/min Final    Comment:    (NOTE) Calculated using the CKD-EPI Creatinine Equation (2021)    GFR  Date Value Ref Range Status  03/23/2019 77.87 >60.00 mL/min Final   eGFR  Date Value Ref Range Status  07/01/2023 67 >59 mL/min/1.73 Final         Passed - Last BP in normal range    BP Readings from Last 1 Encounters:  08/05/23 128/83         Passed - Valid encounter within last 6 months    Recent Outpatient Visits           2 months ago  Diabetes mellitus without complication (HCC)   [facility] [place]. [facility] Drucilla Chalet, RPH-CPP   3 months ago Uncontrolled hypertension   [facility] [place]. [facility] Lois Huxley, [place] L, RPH-CPP   4 months ago Long term (current) use of oral hypoglycemic drugs   [facility] [place]. [facility] Drucilla Chalet, RPH-CPP   4 months ago Uncontrolled hypertension   [facility], [place], [place]   7 months ago Uncontrolled hypertension   [facility], [place], [place]       Future Appointments             In 1 month Lois Huxley, Cornelius Moras, RPH-CPP [facility] Of . [facility]

## 2023-10-15 ENCOUNTER — Other Ambulatory Visit: Payer: Self-pay

## 2023-10-21 ENCOUNTER — Other Ambulatory Visit: Payer: Self-pay | Admitting: Family

## 2023-10-21 ENCOUNTER — Other Ambulatory Visit: Payer: Self-pay

## 2023-10-21 DIAGNOSIS — I10 Essential (primary) hypertension: Secondary | ICD-10-CM

## 2023-10-21 DIAGNOSIS — E1165 Type 2 diabetes mellitus with hyperglycemia: Secondary | ICD-10-CM

## 2023-10-21 DIAGNOSIS — I5022 Chronic systolic (congestive) heart failure: Secondary | ICD-10-CM

## 2023-10-21 MED ORDER — FUROSEMIDE 20 MG PO TABS
20.0000 mg | ORAL_TABLET | Freq: Every day | ORAL | 0 refills | Status: DC
Start: 2023-10-21 — End: 2023-11-15
  Filled 2023-10-21: qty 90, 90d supply, fill #0

## 2023-10-21 MED ORDER — METFORMIN HCL ER 500 MG PO TB24
1500.0000 mg | ORAL_TABLET | Freq: Every day | ORAL | 0 refills | Status: DC
  Filled 2023-10-21: qty 270, 90d supply, fill #0

## 2023-10-21 NOTE — Telephone Encounter (Signed)
 Complete

## 2023-10-22 ENCOUNTER — Other Ambulatory Visit: Payer: Self-pay

## 2023-11-07 ENCOUNTER — Ambulatory Visit: Payer: Medicaid Other | Admitting: Pharmacist

## 2023-11-14 ENCOUNTER — Other Ambulatory Visit: Payer: Self-pay

## 2023-11-15 ENCOUNTER — Other Ambulatory Visit: Payer: Self-pay

## 2023-11-15 ENCOUNTER — Other Ambulatory Visit: Payer: Self-pay | Admitting: Family Medicine

## 2023-11-15 ENCOUNTER — Other Ambulatory Visit: Payer: Self-pay | Admitting: Family

## 2023-11-15 DIAGNOSIS — I5022 Chronic systolic (congestive) heart failure: Secondary | ICD-10-CM

## 2023-11-15 DIAGNOSIS — I10 Essential (primary) hypertension: Secondary | ICD-10-CM

## 2023-11-15 DIAGNOSIS — E785 Hyperlipidemia, unspecified: Secondary | ICD-10-CM

## 2023-11-15 DIAGNOSIS — E1165 Type 2 diabetes mellitus with hyperglycemia: Secondary | ICD-10-CM

## 2023-11-15 MED ORDER — CARVEDILOL 12.5 MG PO TABS
12.5000 mg | ORAL_TABLET | Freq: Two times a day (BID) | ORAL | 0 refills | Status: DC
Start: 2023-11-15 — End: 2024-01-07

## 2023-11-15 MED ORDER — FUROSEMIDE 20 MG PO TABS
20.0000 mg | ORAL_TABLET | Freq: Every day | ORAL | 0 refills | Status: DC
Start: 2023-11-15 — End: 2024-02-11

## 2023-11-15 MED ORDER — METFORMIN HCL ER 500 MG PO TB24: 1500.0000 mg | ORAL_TABLET | Freq: Every day | ORAL | 0 refills | Status: DC

## 2023-11-21 ENCOUNTER — Other Ambulatory Visit: Payer: Self-pay

## 2023-12-06 NOTE — Progress Notes (Signed)
 S:     48 y.o. female who presents for diabetes evaluation, education, and management. PMH is significant for T2DM, HTN, HFrEF (EF40-45% in 2022).  Patient was referred and last seen by Primary Care Provider, [name], on 05/20/2023. At that visit A1C was 8.7 %. At pharmacy appointment on 07/01/23, Trulicity  was increased to 1.5 mg weekly and valsartan  was increased to 160 mg daily. At last pharmacy appt on 08/05/23, A1c had decreased to 6.9%. No medication changes were made.   Today, patient reports that she is doing well. She is not checking her BG at home but she denies s/sx of hyperglycemia or hypoglycemia. She reports adherence to metformin  and Trulicity . She denies nausea, diarrhea, vomiting, upset stomach with Trulicity . She endorses appetite suppression with Trulicity , as she often will become very hungry, then eat 2 or 3 bites before feeling full. She does not eat past the feeling of fullness. She reports that she requested a refill of spironolactone , which has not yet been sent to the pharmacy, so she recently ran out. She states that she has not yet been to the [facility] enrollment office, but she was encouraged to do so today. She denies HA, dizziness, lightheadedness, LE swelling. She states that she has not been following up with cardiology, because the copay was ~$180 without insurance.   Family/Social History:  Fhx: Diabetes (mother) Heart disease (mother)  - Current diabetes medications include: metformin  XR 1500 mg in AM, Trulicity  (dulaglutide ) 1.5 mg weekly (Mondays) - Current hypertension medications include: carvedilol  12.5 mg twice daily (taking both tablets at once), spironolactone  25 mg daily (not taking), furosemide  20 mg daily, valsartan  160 mg daily  - Current hyperlipidemia medications include: atorvastatin  40 mg daily   Patient reports adherence to taking all medications as prescribed.   Insurance coverage: none  Patient denies hypoglycemic events.  Reported  home fasting blood sugars: not checking Reported 2 hour post-meal/random blood sugars: not checking.  Patient denies nocturia (nighttime urination).  Patient denies neuropathy (nerve pain). Patient denies visual changes. Patient reports self foot exams.   Patient reported dietary habits: - Pt admits to dietary indiscretion. Eats 2 meals/day.  Breakfast: eggs, pancakes, sausage,toast  Dinner: chicken, hamburger, pizza Drinks: water, apple juice (most days), orange juice, tea (occasionally) - reports finishing 2L bottle of regular coke every 1.5 days. She has not tried zero sugar sodas.   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  BP Readings from Last 3 Encounters:  08/05/23 128/83  07/01/23 (!) 159/111  06/03/23 (!) 159/114   UACR 02/27/23: 103 mg/g  Lipid Panel     Component Value Date/Time   CHOL 189 05/20/2023 1221   TRIG 134 05/20/2023 1221   HDL 65 05/20/2023 1221   CHOLHDL 2.9 05/20/2023 1221   CHOLHDL 3 03/23/2019 0852   VLDL 17.8 03/23/2019 0852   LDLCALC 101 (H) 05/20/2023 1221    Clinical Atherosclerotic Cardiovascular Disease (ASCVD): No  The 10-year ASCVD risk score (Arnett DK, et al., 2019) is: 5.6%   Values used to calculate the score:     Age: 28 years     Sex: Female     Is Non-Hispanic African American: Yes     Diabetic: Yes     Tobacco smoker: No  Systolic Blood Pressure: 128 mmHg     Is BP treated: Yes     HDL Cholesterol: 65 mg/dL     Total Cholesterol: 189 mg/dL   A/P: Diabetes longstanding currently at goal with A1c 6.9% below goal < 7% and stable from last value. Last UACR in July 2024 elevated > 100 mg/g - patient would be a good candidate for SGLT2i, but could only obtain via PAP. Will collaborate with patient advocate team to facilitate  coverage. Patient is able to verbalize appropriate hypoglycemia management plan. Medication adherence appears optimal. Discussed at length the risk of continuing large intake of sugary beverages on a daily basis, given the progressive nature of T2D - patient expresses understanding and will attempt to cut back.  -Continued Trulicity  (dulaglutide ) 1.5 mg once weekly.  -Continued metformin  XR 1500 mg daily. -Initiate application for Farxiga  10 mg daily via [employer] - will collaborate with patient advocate team at [facility] pharmacy -Encouraged patient to apply for Medicaid. -Patient educated on purpose, proper use, and potential adverse effects of medications.  -Extensively discussed pathophysiology of diabetes, recommended lifestyle interventions, dietary effects on blood sugar control.  -Counseled on s/sx of and management of hypoglycemia.  -Next A1c anticipated 02/2024.  Hypertension longstanding. Blood pressure goal of <130/80  mmHg. Will reassess control at follow-up. Medication adherence reported, except to spironolactone , which she has recently run out of. Last K WNL and Scr stable - will refill today. Patient reported taking carvedilol  two tablets at once - discussed that this is a twice daily medication and each dose should be taken ~12 hr about. Patient expressed understanding.  -Continued valsartan  160 mg daily -Continued carvedilol  12.5 mg twice daily  -Continued spironolactone  25 mg. -Continued furosemide  20 mg daily  Written patient instructions provided. Patient verbalized understanding of treatment plan.   Total time in face to face  30 minutes.    Follow-up:  Pharmacist 01/20/24 PCP clinic visit in 01/07/24  Arthea Larsson, PharmD PGY1 Pharmacy Resident

## 2023-12-09 ENCOUNTER — Encounter: Payer: Self-pay | Admitting: Pharmacist

## 2023-12-09 ENCOUNTER — Telehealth: Payer: Self-pay | Admitting: Family

## 2023-12-09 ENCOUNTER — Ambulatory Visit: Payer: Self-pay | Attending: Family | Admitting: Pharmacist

## 2023-12-09 ENCOUNTER — Other Ambulatory Visit: Payer: Self-pay

## 2023-12-09 DIAGNOSIS — Z79899 Other long term (current) drug therapy: Secondary | ICD-10-CM | POA: Insufficient documentation

## 2023-12-09 DIAGNOSIS — Z7984 Long term (current) use of oral hypoglycemic drugs: Secondary | ICD-10-CM

## 2023-12-09 DIAGNOSIS — I110 Hypertensive heart disease with heart failure: Secondary | ICD-10-CM | POA: Insufficient documentation

## 2023-12-09 DIAGNOSIS — I5022 Chronic systolic (congestive) heart failure: Secondary | ICD-10-CM | POA: Insufficient documentation

## 2023-12-09 DIAGNOSIS — E119 Type 2 diabetes mellitus without complications: Secondary | ICD-10-CM

## 2023-12-09 DIAGNOSIS — E785 Hyperlipidemia, unspecified: Secondary | ICD-10-CM | POA: Insufficient documentation

## 2023-12-09 DIAGNOSIS — I10 Essential (primary) hypertension: Secondary | ICD-10-CM

## 2023-12-09 DIAGNOSIS — Z7985 Long-term (current) use of injectable non-insulin antidiabetic drugs: Secondary | ICD-10-CM

## 2023-12-09 LAB — POCT GLYCOSYLATED HEMOGLOBIN (HGB A1C): HbA1c, POC (controlled diabetic range): 6.9 % (ref 0.0–7.0)

## 2023-12-09 MED ORDER — SPIRONOLACTONE 25 MG PO TABS
25.0000 mg | ORAL_TABLET | Freq: Every day | ORAL | 1 refills | Status: DC
  Filled 2023-12-09: qty 90, 90d supply, fill #0
  Filled 2024-04-13: qty 90, 90d supply, fill #1

## 2023-12-09 MED ORDER — DAPAGLIFLOZIN PROPANEDIOL 10 MG PO TABS
10.0000 mg | ORAL_TABLET | Freq: Every day | ORAL | 3 refills | Status: AC
  Filled 2023-12-09: qty 30, 30d supply, fill #0
  Filled 2023-12-09: qty 14, 14d supply, fill #0
  Filled 2023-12-23: qty 7, 7d supply, fill #1
  Filled 2024-01-07 – 2024-04-13 (×2): qty 7, 7d supply, fill #2

## 2023-12-09 NOTE — Patient Instructions (Addendum)
 It was great to see you!  Continue all medications as prescribed. A1C today was 6.9%. Do your best to decrease intake of sugary drinks like juice and soda. Try zero sugar options to help you cut back.  Apply for Medicaid on the fourth floor.   Fill out an application for Farxiga  assistance at the pharmacy downstairs.   Let us  know if you have any questions before our next appointment

## 2023-12-09 NOTE — Telephone Encounter (Signed)
 A document form has been faxed:  Farxiga  Patient Assistance Application , to be filled out by provider. Send document back via Fax within ASAP. Document is located in providers tray at front office.           Fax number:  [phone]

## 2023-12-10 ENCOUNTER — Encounter: Payer: Self-pay | Admitting: Family

## 2023-12-13 NOTE — Telephone Encounter (Signed)
 Faxed back today

## 2023-12-16 ENCOUNTER — Other Ambulatory Visit: Payer: Self-pay

## 2023-12-23 ENCOUNTER — Other Ambulatory Visit: Payer: Self-pay | Admitting: Family

## 2023-12-23 DIAGNOSIS — I5022 Chronic systolic (congestive) heart failure: Secondary | ICD-10-CM

## 2023-12-23 DIAGNOSIS — I10 Essential (primary) hypertension: Secondary | ICD-10-CM

## 2023-12-24 ENCOUNTER — Other Ambulatory Visit: Payer: Self-pay

## 2023-12-26 ENCOUNTER — Other Ambulatory Visit: Payer: Self-pay

## 2023-12-30 ENCOUNTER — Other Ambulatory Visit: Payer: Self-pay

## 2023-12-30 ENCOUNTER — Telehealth: Payer: Self-pay

## 2023-12-30 NOTE — Telephone Encounter (Signed)
 Received notification from [employer] regarding approval for FARXIGA . Patient assistance approved from 12/27/2023 to 12/26/2024.  Medication will ship to [address] [place], [place] [address]  Pt ID: [id number]  Company phone: [phone]

## 2023-12-31 ENCOUNTER — Other Ambulatory Visit: Payer: Self-pay

## 2024-01-07 ENCOUNTER — Other Ambulatory Visit: Payer: Self-pay

## 2024-01-07 ENCOUNTER — Encounter: Payer: Self-pay | Admitting: Family

## 2024-01-07 ENCOUNTER — Ambulatory Visit (INDEPENDENT_AMBULATORY_CARE_PROVIDER_SITE_OTHER): Payer: Self-pay | Admitting: Family

## 2024-01-07 VITALS — BP 143/98 | HR 99 | Wt 211.2 lb

## 2024-01-07 DIAGNOSIS — Z7985 Long-term (current) use of injectable non-insulin antidiabetic drugs: Secondary | ICD-10-CM

## 2024-01-07 DIAGNOSIS — E1165 Type 2 diabetes mellitus with hyperglycemia: Secondary | ICD-10-CM

## 2024-01-07 DIAGNOSIS — E119 Type 2 diabetes mellitus without complications: Secondary | ICD-10-CM

## 2024-01-07 DIAGNOSIS — Z7984 Long term (current) use of oral hypoglycemic drugs: Secondary | ICD-10-CM

## 2024-01-07 DIAGNOSIS — I5022 Chronic systolic (congestive) heart failure: Secondary | ICD-10-CM

## 2024-01-07 DIAGNOSIS — I10 Essential (primary) hypertension: Secondary | ICD-10-CM

## 2024-01-07 MED ORDER — CARVEDILOL 12.5 MG PO TABS
12.5000 mg | ORAL_TABLET | Freq: Two times a day (BID) | ORAL | 0 refills | Status: DC
Start: 2024-01-07 — End: 2024-04-13
  Filled 2024-01-07: qty 180, 90d supply, fill #0

## 2024-01-07 NOTE — Progress Notes (Signed)
 Patient ID: [name], female    DOB: 03/23/1976  MRN: [id number]  CC: Chronic Conditions Follow-Up  Subjective: [name] is a 48 y.o. female who presents for chronic conditions follow-up.  Her concerns today include:  - Doing well on Valsartan , Spironolactone , and Furosemide , no issues/concerns. States she has not taken Carvedilol  in 2 weeks. States she did not follow-up with Cardiology due to health insurance copay cost. She does not complain of red flag symptoms such as but not limited to chest pain, shortness of breath, worst headache of life, nausea/vomiting.  - Doing well on Dulaglutide , Metformin  XR, and Dapagliflozin  Propanediol, no issues/concerns. She denies red flag symptoms associated with diabetes.  - Due for diabetic eye exam.  - Due for diabetic foot exam.  - States she is in the process of applying with a new health insurance provider.   Patient Active Problem List   Diagnosis Date Noted   Trichomoniasis 03/22/2023   Chlamydia 03/22/2023   Iron  deficiency anemia 11/24/2020   Exertional dyspnea 10/04/2020   Hypertensive urgency 10/04/2020   Left lumbar radiculitis 03/23/2019   Vitamin D  deficiency disease 06/04/2018   Routine general medical examination at a health care facility 05/15/2017   Cervical cancer screening 05/15/2017   Dyslipidemia, goal LDL below 100 04/04/2017   Snoring 10/25/2016   Obesity (BMI 30-39.9) 03/09/2014   Diuretic-induced hypokalemia 02/13/2013   Diabetes mellitus without complication (HCC) 02/12/2013   Malignant hypertension 06/01/2009     Current Outpatient Medications on File Prior to Visit  Medication Sig Dispense Refill   atorvastatin  (LIPITOR) 40 MG tablet Take 1 tablet (40 mg total) by mouth daily. 90 tablet 0   Blood Glucose Monitoring Suppl (TRUE METRIX METER) w/Device KIT 1 each by Does not apply route as directed. 1 kit 0   dapagliflozin  propanediol (FARXIGA ) 10 MG TABS tablet Take 1 tablet (10 mg total) by  mouth daily. 90 tablet 3   Dulaglutide  (TRULICITY ) 1.5 MG/0.5ML SOAJ Inject 1.5 mg into the skin once a week. 2 mL 6   furosemide  (LASIX ) 20 MG tablet Take 1 tablet (20 mg total) by mouth daily. 90 tablet 0   glucose blood (TRUE METRIX BLOOD GLUCOSE TEST) test strip Use as instructed 100 each 12   levonorgestrel (MIRENA) 20 MCG/24HR IUD 1 each by Intrauterine route once. 1 each 0   metFORMIN  (GLUCOPHAGE -XR) 500 MG 24 hr tablet Take 3 tablets (1,500 mg total) by mouth daily with breakfast. 270 tablet 0   spironolactone  (ALDACTONE ) 25 MG tablet Take 1 tablet (25 mg total) by mouth at bedtime. 90 tablet 1   TRUEplus Lancets 28G MISC 1 each by Does not apply route daily. 100 each 2   valsartan  (DIOVAN ) 160 MG tablet Take 1 tablet (160 mg total) by mouth daily. 90 tablet 3   Current Facility-Administered Medications on File Prior to Visit  Medication Dose Route Frequency Provider Last Rate Last Admin   hydrALAZINE  (APRESOLINE ) tablet 25 mg  25 mg Oral Once Senaida Dama, NP        No Known Allergies  Social History   Socioeconomic History   Marital status: Single    Spouse name: Not on file   Number of children: 2   Years of education: Not on file   Highest education level: Bachelor's degree (e.g., BA, AB, BS)  Occupational History    Employer: [employer]  Tobacco Use   Smoking status: Never    Passive exposure: Never   Smokeless tobacco: Never  Vaping Use   Vaping status: Never Used  Substance and Sexual Activity   Alcohol use: Yes   Drug use: No   Sexual activity: Yes    Birth control/protection: I.U.D.    Comment: Mirena IUD inserted 2012?,intercourse age 31, more than 5 sexual partners  Other Topics Concern   Not on file  Social History Narrative   Not on file   Social Drivers of Health   Financial Resource Strain: Medium Risk (02/26/2023)   Overall Financial Resource Strain (CARDIA)    Difficulty of Paying Living Expenses: Somewhat hard  Food Insecurity: Food Insecurity  Present (02/26/2023)   Hunger Vital Sign    Worried About Running Out of Food in the Last Year: Sometimes true    Ran Out of Food in the Last Year: Sometimes true  Transportation Needs: No Transportation Needs (02/26/2023)   PRAPARE - Administrator, Civil Service (Medical): No    Lack of Transportation (Non-Medical): No  Physical Activity: Sufficiently Active (02/26/2023)   Exercise Vital Sign    Days of Exercise per Week: 3 days    Minutes of Exercise per Session: 150+ min  Stress: Stress Concern Present (02/26/2023)   [employer] of Occupational Health - Occupational Stress Questionnaire    Feeling of Stress : To some extent  Social Connections: Moderately Isolated (02/26/2023)   Social Connection and Isolation Panel [NHANES]    Frequency of Communication with Friends and Family: Twice a week    Frequency of Social Gatherings with Friends and Family: Three times a week    Attends Religious Services: 1 to 4 times per year    Active Member of Clubs or Organizations: No    Attends Engineer, structural: Not on file    Marital Status: Never married  Catering manager Violence: Not on file    Family History  Problem Relation Age of Onset   Diabetes Mother    Heart disease Mother    Hypertension Maternal Aunt    Breast cancer Neg Hx     Past Surgical History:  Procedure Laterality Date   CESAREAN SECTION  2006    ROS: Review of Systems Negative except as stated above  PHYSICAL EXAM: BP (!) 143/98   Pulse 99   Wt 211 lb 3.2 oz (95.8 kg)   SpO2 95%   BMI 35.15 kg/m   Physical Exam HENT:     Head: Normocephalic and atraumatic.     Nose: Nose normal.     Mouth/Throat:     Mouth: Mucous membranes are moist.     Pharynx: Oropharynx is clear.  Eyes:     Extraocular Movements: Extraocular movements intact.     Conjunctiva/sclera: Conjunctivae normal.     Pupils: Pupils are equal, round, and reactive to light.  Cardiovascular:     Rate and Rhythm:  Normal rate and regular rhythm.     Pulses: Normal pulses.     Heart sounds: Normal heart sounds.  Pulmonary:     Effort: Pulmonary effort is normal.     Breath sounds: Normal breath sounds.  Musculoskeletal:        General: Normal range of motion.     Cervical back: Normal range of motion and neck supple.  Neurological:     General: No focal deficit present.     Mental Status: She is alert and oriented to person, place, and time.  Psychiatric:        Mood and Affect: Mood normal.  Behavior: Behavior normal.     ASSESSMENT AND PLAN: 1. Uncontrolled hypertension (Primary) 2. Malignant hypertension 3. Chronic systolic heart failure (HCC) - Blood pressure not at goal during today's visit. Patient asymptomatic without chest pressure, chest pain, palpitations, shortness of breath, worst headache of life, and any additional red flag symptoms. - Continue Valsartan , Spironolactone , and Furosemide  as prescribed. No refills needed as of present.  - Resume Carvedilol  as prescribed.  - Counseled on blood pressure goal of less than 130/80, low-sodium, DASH diet, medication compliance, and 150 minutes of moderate intensity exercise per week as tolerated. Counseled on medication adherence and adverse effects. - Follow-up with primary provider in 4 weeks or sooner if needed.  - carvedilol  (COREG ) 12.5 MG tablet; Take 1 tablet (12.5 mg total) by mouth 2 (two) times daily with a meal.  Dispense: 180 tablet; Refill: 0 - Ambulatory referral to Cardiology  4. Type 2 diabetes mellitus with hyperglycemia, without long-term current use of insulin  (HCC) - Hemoglobin A1c at goal at 6.9% on 12/09/2023. Goal 7%.  - Continue Metformin  XR, Dulaglutide , and Dapagliflozin  Propanediol as prescribed. No refills needed as of present.  - Discussed the importance of healthy eating habits, low-carbohydrate diet, low-sugar diet, regular aerobic exercise (at least 150 minutes a week as tolerated) and medication  compliance to achieve or maintain control of diabetes. Counseled on medication adherence/adverse effects.  - Keep all scheduled appointments with clinical pharmacist.  - Follow-up with primary provider as scheduled.  5. Diabetic eye exam [facility]) - Referral to Ophthalmology for evaluation/management. - Ambulatory referral to Ophthalmology  6. Encounter for diabetic foot exam [facility]) - Referral to Podiatry for evaluation/management. - Ambulatory referral to Podiatry     Patient was given the opportunity to ask questions.  Patient verbalized understanding of the plan and was able to repeat key elements of the plan. Patient was given clear instructions to go to Emergency Department or return to medical center if symptoms don't improve, worsen, or new problems develop.The patient verbalized understanding.   Orders Placed This Encounter  Procedures   Ambulatory referral to Cardiology   Ambulatory referral to Ophthalmology   Ambulatory referral to Podiatry     Requested Prescriptions   Signed Prescriptions Disp Refills   carvedilol  (COREG ) 12.5 MG tablet 180 tablet 0    Sig: Take 1 tablet (12.5 mg total) by mouth 2 (two) times daily with a meal.    Return in about 4 weeks (around 02/04/2024) for Follow-Up or next available chronic conditions.  Senaida Dama, NP

## 2024-01-14 ENCOUNTER — Other Ambulatory Visit: Payer: Self-pay

## 2024-01-15 ENCOUNTER — Other Ambulatory Visit: Payer: Self-pay | Admitting: Family Medicine

## 2024-01-15 ENCOUNTER — Other Ambulatory Visit: Payer: Self-pay

## 2024-01-15 DIAGNOSIS — E785 Hyperlipidemia, unspecified: Secondary | ICD-10-CM

## 2024-01-15 MED ORDER — ATORVASTATIN CALCIUM 40 MG PO TABS
40.0000 mg | ORAL_TABLET | Freq: Every day | ORAL | 0 refills | Status: DC
  Filled 2024-01-15: qty 90, 90d supply, fill #0

## 2024-01-20 ENCOUNTER — Ambulatory Visit: Admitting: Pharmacist

## 2024-01-21 ENCOUNTER — Other Ambulatory Visit: Payer: Self-pay

## 2024-02-11 ENCOUNTER — Other Ambulatory Visit: Payer: Self-pay | Admitting: *Deleted

## 2024-02-11 ENCOUNTER — Other Ambulatory Visit (HOSPITAL_COMMUNITY): Payer: Self-pay

## 2024-02-11 ENCOUNTER — Ambulatory Visit (INDEPENDENT_AMBULATORY_CARE_PROVIDER_SITE_OTHER): Payer: Self-pay | Admitting: Family

## 2024-02-11 VITALS — BP 128/89 | HR 95 | Temp 97.6°F | Resp 16 | Ht 66.0 in | Wt 214.6 lb

## 2024-02-11 DIAGNOSIS — I10 Essential (primary) hypertension: Secondary | ICD-10-CM

## 2024-02-11 DIAGNOSIS — I5022 Chronic systolic (congestive) heart failure: Secondary | ICD-10-CM

## 2024-02-11 DIAGNOSIS — E119 Type 2 diabetes mellitus without complications: Secondary | ICD-10-CM

## 2024-02-11 DIAGNOSIS — Z599 Problem related to housing and economic circumstances, unspecified: Secondary | ICD-10-CM

## 2024-02-11 DIAGNOSIS — Z7984 Long term (current) use of oral hypoglycemic drugs: Secondary | ICD-10-CM

## 2024-02-11 DIAGNOSIS — E1165 Type 2 diabetes mellitus with hyperglycemia: Secondary | ICD-10-CM

## 2024-02-11 MED ORDER — FUROSEMIDE 20 MG PO TABS: 20.0000 mg | ORAL_TABLET | Freq: Every day | ORAL | 0 refills | Status: DC

## 2024-02-11 MED ORDER — FUROSEMIDE 20 MG PO TABS
20.0000 mg | ORAL_TABLET | Freq: Every day | ORAL | 0 refills | Status: DC
  Filled 2024-02-11: qty 90, 90d supply, fill #0
  Filled 2024-02-20: qty 30, 30d supply, fill #0
  Filled 2024-03-16: qty 30, 30d supply, fill #1
  Filled 2024-04-30: qty 30, 30d supply, fill #2

## 2024-02-11 MED ORDER — METFORMIN HCL ER 500 MG PO TB24: 1500.0000 mg | ORAL_TABLET | Freq: Every day | ORAL | 0 refills | Status: DC

## 2024-02-11 NOTE — Progress Notes (Signed)
 Resent to correct pharmacy per patient

## 2024-02-11 NOTE — Progress Notes (Signed)
 Patient ID: [name], female    DOB: 26-Mar-1976  MRN: [id number]  CC: Chronic Conditions Follow-Up  Subjective: [name] is a 48 y.o. female who presents for chronic conditions follow-up.   Her concerns today include:  - Doing well on Valsartan , Spironolactone , Furosemide , and Carvedilol , no issues/concerns. States she only needs refills of Furosemide  presently. States she has not scheduled an appointment with Cardiology because she is not having any symptoms and doesn't feel like she needs to see them. Patient also stated out of pocket costs for Cardiology appointments are expensive. I discussed with patient in detail the importance of seeing Cardiology and to let them determine the appropriate follow-up plan. Also, I discussed with patient in detail adverse effects/risks related to her cardiac history. Patient verbalized understanding. She does not complain of red flag symptoms such as but not limited to chest pain, shortness of breath, worst headache of life, nausea/vomiting.  - Doing well on Metformin  XR, Dulaglutide , and Dapagliflozin  Propanediol, no issues/concerns. States she only needs refills of Metformin  XR presently. She denies red flag symptoms associated with diabetes.  - Due for diabetic eye exam. - States Podiatry called her for an appointment for diabetic foot exam and she did not return their call.   Patient Active Problem List   Diagnosis Date Noted   Trichomoniasis 03/22/2023   Chlamydia 03/22/2023   Iron  deficiency anemia 11/24/2020   Exertional dyspnea 10/04/2020   Hypertensive urgency 10/04/2020   Left lumbar radiculitis 03/23/2019   Vitamin D  deficiency disease 06/04/2018   Routine general medical examination at a health care facility 05/15/2017   Cervical cancer screening 05/15/2017   Dyslipidemia, goal LDL below 100 04/04/2017   Snoring 10/25/2016   Obesity (BMI 30-39.9) 03/09/2014   Diuretic-induced hypokalemia 02/13/2013   Diabetes mellitus  without complication (HCC) 02/12/2013   Malignant hypertension 06/01/2009     Current Outpatient Medications on File Prior to Visit  Medication Sig Dispense Refill   atorvastatin  (LIPITOR) 40 MG tablet Take 1 tablet (40 mg total) by mouth daily. 90 tablet 0   Blood Glucose Monitoring Suppl (TRUE METRIX METER) w/Device KIT 1 each by Does not apply route as directed. 1 kit 0   carvedilol  (COREG ) 12.5 MG tablet Take 1 tablet (12.5 mg total) by mouth 2 (two) times daily with a meal. 180 tablet 0   dapagliflozin  propanediol (FARXIGA ) 10 MG TABS tablet Take 1 tablet (10 mg total) by mouth daily. 90 tablet 3   Dulaglutide  (TRULICITY ) 1.5 MG/0.5ML SOAJ Inject 1.5 mg into the skin once a week. 2 mL 6   glucose blood (TRUE METRIX BLOOD GLUCOSE TEST) test strip Use as instructed 100 each 12   levonorgestrel (MIRENA) 20 MCG/24HR IUD 1 each by Intrauterine route once. 1 each 0   spironolactone  (ALDACTONE ) 25 MG tablet Take 1 tablet (25 mg total) by mouth at bedtime. 90 tablet 1   TRUEplus Lancets 28G MISC 1 each by Does not apply route daily. 100 each 2   valsartan  (DIOVAN ) 160 MG tablet Take 1 tablet (160 mg total) by mouth daily. 90 tablet 3   Current Facility-Administered Medications on File Prior to Visit  Medication Dose Route Frequency Provider Last Rate Last Admin   hydrALAZINE  (APRESOLINE ) tablet 25 mg  25 mg Oral Once Lorren Greig PARAS, NP        No Known Allergies  Social History   Socioeconomic History   Marital status: Single    Spouse name: Not on file  Number of children: 2   Years of education: Not on file   Highest education level: Bachelor's degree (e.g., BA, AB, BS)  Occupational History    Employer: [employer]  Tobacco Use   Smoking status: Never    Passive exposure: Never   Smokeless tobacco: Never  Vaping Use   Vaping status: Never Used  Substance and Sexual Activity   Alcohol use: Yes   Drug use: No   Sexual activity: Yes    Birth control/protection: I.U.D.    Comment:  Mirena IUD inserted 2012?,intercourse age 34, more than 5 sexual partners  Other Topics Concern   Not on file  Social History Narrative   Not on file   Social Drivers of Health   Financial Resource Strain: Medium Risk (02/26/2023)   Overall Financial Resource Strain (CARDIA)    Difficulty of Paying Living Expenses: Somewhat hard  Food Insecurity: Food Insecurity Present (02/26/2023)   Hunger Vital Sign    Worried About Running Out of Food in the Last Year: Sometimes true    Ran Out of Food in the Last Year: Sometimes true  Transportation Needs: No Transportation Needs (02/26/2023)   PRAPARE - Administrator, Civil Service (Medical): No    Lack of Transportation (Non-Medical): No  Physical Activity: Sufficiently Active (02/26/2023)   Exercise Vital Sign    Days of Exercise per Week: 3 days    Minutes of Exercise per Session: 150+ min  Stress: Stress Concern Present (02/26/2023)   [employer] of Occupational Health - Occupational Stress Questionnaire    Feeling of Stress : To some extent  Social Connections: Moderately Isolated (02/26/2023)   Social Connection and Isolation Panel    Frequency of Communication with Friends and Family: Twice a week    Frequency of Social Gatherings with Friends and Family: Three times a week    Attends Religious Services: 1 to 4 times per year    Active Member of Clubs or Organizations: No    Attends Engineer, structural: Not on file    Marital Status: Never married  Catering manager Violence: Not on file    Family History  Problem Relation Age of Onset   Diabetes Mother    Heart disease Mother    Hypertension Maternal Aunt    Breast cancer Neg Hx     Past Surgical History:  Procedure Laterality Date   CESAREAN SECTION  2006    ROS: Review of Systems Negative except as stated above  PHYSICAL EXAM: BP 128/89   Pulse 95   Temp 97.6 F (36.4 C) (Oral)   Resp 16   Ht 5' 6 (1.676 m)   Wt 214 lb 9.6 oz (97.3 kg)    SpO2 97%   BMI 34.64 kg/m   Physical Exam HENT:     Head: Normocephalic and atraumatic.     Nose: Nose normal.     Mouth/Throat:     Mouth: Mucous membranes are moist.     Pharynx: Oropharynx is clear.   Eyes:     Extraocular Movements: Extraocular movements intact.     Conjunctiva/sclera: Conjunctivae normal.     Pupils: Pupils are equal, round, and reactive to light.    Cardiovascular:     Rate and Rhythm: Normal rate and regular rhythm.     Pulses: Normal pulses.     Heart sounds: Normal heart sounds.  Pulmonary:     Effort: Pulmonary effort is normal.     Breath sounds: Normal breath sounds.  Musculoskeletal:        General: Normal range of motion.     Cervical back: Normal range of motion and neck supple.   Neurological:     General: No focal deficit present.     Mental Status: She is alert and oriented to person, place, and time.   Psychiatric:        Mood and Affect: Mood normal.        Behavior: Behavior normal.    ASSESSMENT AND PLAN: 1. Primary hypertension (Primary) 2. Malignant hypertension 3. Chronic systolic heart failure (HCC) - Continue present management. Furosemide  refilled per patient request. Patient states she does not need any additional refills presently.  - Routine screening.  - Counseled on blood pressure goal of less than 130/80, low-sodium, DASH diet, medication compliance, and 150 minutes of moderate intensity exercise per week as tolerated. Counseled on medication adherence and adverse effects. - Referral to Cardiology for evaluation/management.  - Follow-up with primary provider in 3 months or sooner if needed.  - Basic Metabolic Panel - furosemide  (LASIX ) 20 MG tablet; Take 1 tablet (20 mg total) by mouth daily.  Dispense: 90 tablet; Refill: 0 - Ambulatory referral to Cardiology  4. Type 2 diabetes mellitus with hyperglycemia, without long-term current use of insulin  (HCC) - Hemoglobin A1c 6.9% on 12/09/2023. - Continue present  management. Metformin  XR refilled per patient request. Patient states she does not need any additional refills presently.  - Routine screening.  - Discussed the importance of healthy eating habits, low-carbohydrate diet, low-sugar diet, regular aerobic exercise (at least 150 minutes a week as tolerated) and medication compliance to achieve or maintain control of diabetes. Counseled on medication adherence/adverse effects.  - Follow-up with primary provider in 4 weeks or sooner if needed. - Microalbumin / creatinine urine ratio - metFORMIN  (GLUCOPHAGE -XR) 500 MG 24 hr tablet; Take 3 tablets (1,500 mg total) by mouth daily with breakfast.  Dispense: 270 tablet; Refill: 0  5. Diabetic eye exam [facility]) - Referral to Ophthalmology for evaluation/management. - Ambulatory referral to Ophthalmology  6. Encounter for diabetic foot exam [facility]) - Referral to Podiatry for evaluation/management. - Ambulatory referral to Podiatry  7. Financial difficulties - Offered patient  financial discount/orange card.   Patient was given the opportunity to ask questions.  Patient verbalized understanding of the plan and was able to repeat key elements of the plan. Patient was given clear instructions to go to Emergency Department or return to medical center if symptoms don't improve, worsen, or new problems develop.The patient verbalized understanding.   Orders Placed This Encounter  Procedures   Microalbumin / creatinine urine ratio   Basic Metabolic Panel   Ambulatory referral to Ophthalmology   Ambulatory referral to Podiatry   Ambulatory referral to Cardiology     Requested Prescriptions   Signed Prescriptions Disp Refills   furosemide  (LASIX ) 20 MG tablet 90 tablet 0    Sig: Take 1 tablet (20 mg total) by mouth daily.   metFORMIN  (GLUCOPHAGE -XR) 500 MG 24 hr tablet 270 tablet 0    Sig: Take 3 tablets (1,500 mg total) by mouth daily with breakfast.    Return in about 3 months (around  05/13/2024) for Follow-Up or next available chronic conditions and give patient CAFA/Orange Card application.  Greig JINNY Drones, NP

## 2024-02-12 ENCOUNTER — Ambulatory Visit: Payer: Self-pay | Admitting: Family

## 2024-02-12 LAB — BASIC METABOLIC PANEL WITH GFR
BUN/Creatinine Ratio: 15 (ref 9–23)
BUN: 15 mg/dL (ref 6–24)
CO2: 21 mmol/L (ref 20–29)
Calcium: 9.6 mg/dL (ref 8.7–10.2)
Chloride: 102 mmol/L (ref 96–106)
Creatinine, Ser: 1.03 mg/dL — ABNORMAL HIGH (ref 0.57–1.00)
Glucose: 89 mg/dL (ref 70–99)
Potassium: 4.1 mmol/L (ref 3.5–5.2)
Sodium: 141 mmol/L (ref 134–144)
eGFR: 66 mL/min/{1.73_m2} (ref >59)

## 2024-02-12 LAB — MICROALBUMIN / CREATININE URINE RATIO
Creatinine, Urine: 103.4 mg/dL
Microalb/Creat Ratio: 3 mg/g{creat} (ref 0–29)
Microalbumin, Urine: 3 ug/mL

## 2024-02-17 ENCOUNTER — Telehealth: Payer: Self-pay

## 2024-02-17 ENCOUNTER — Other Ambulatory Visit: Payer: Self-pay | Admitting: Family Medicine

## 2024-02-17 DIAGNOSIS — E1165 Type 2 diabetes mellitus with hyperglycemia: Secondary | ICD-10-CM

## 2024-02-17 MED ORDER — TRULICITY 1.5 MG/0.5ML ~~LOC~~ SOAJ
1.5000 mg | SUBCUTANEOUS | 6 refills | Status: AC
  Filled 2024-02-17: qty 2, 28d supply, fill #0
  Filled 2024-03-16: qty 2, 28d supply, fill #1
  Filled 2024-04-16: qty 2, 28d supply, fill #2
  Filled 2024-05-13: qty 2, 28d supply, fill #3
  Filled 2024-06-10: qty 2, 28d supply, fill #4
  Filled 2024-07-12: qty 2, 28d supply, fill #5
  Filled 2024-08-09: qty 2, 28d supply, fill #6

## 2024-02-17 NOTE — Telephone Encounter (Signed)
 Copied from CRM [id number]. Topic: Clinical - Prescription Issue >> Feb 17, 2024  9:22 AM Edsel HERO wrote: Patient states that her metFORMIN  (GLUCOPHAGE -XR) 500 MG 24 hr tablet was sent to the wrong pharmacy and it needs to be sent to  [place] [facility]  Phone: [phone] Fax: [phone]

## 2024-02-18 ENCOUNTER — Other Ambulatory Visit: Payer: Self-pay

## 2024-02-20 ENCOUNTER — Other Ambulatory Visit (HOSPITAL_COMMUNITY): Payer: Self-pay

## 2024-02-20 ENCOUNTER — Other Ambulatory Visit: Payer: Self-pay

## 2024-02-20 MED ORDER — METFORMIN HCL ER 500 MG PO TB24
1500.0000 mg | ORAL_TABLET | Freq: Every day | ORAL | 0 refills | Status: DC
  Filled 2024-02-20: qty 90, 30d supply, fill #0
  Filled 2024-02-20: qty 270, 90d supply, fill #0

## 2024-02-20 NOTE — Addendum Note (Signed)
 Addended by: Jerzey Komperda on: 02/20/2024 05:09 PM   Modules accepted: Orders

## 2024-02-28 ENCOUNTER — Other Ambulatory Visit: Payer: Self-pay

## 2024-03-16 ENCOUNTER — Other Ambulatory Visit: Payer: Self-pay

## 2024-03-20 ENCOUNTER — Other Ambulatory Visit: Payer: Self-pay

## 2024-03-23 ENCOUNTER — Other Ambulatory Visit: Payer: Self-pay

## 2024-03-25 ENCOUNTER — Other Ambulatory Visit: Payer: Self-pay

## 2024-04-13 ENCOUNTER — Other Ambulatory Visit: Payer: Self-pay | Admitting: Family

## 2024-04-13 ENCOUNTER — Other Ambulatory Visit: Payer: Self-pay

## 2024-04-13 DIAGNOSIS — E785 Hyperlipidemia, unspecified: Secondary | ICD-10-CM

## 2024-04-13 DIAGNOSIS — I10 Essential (primary) hypertension: Secondary | ICD-10-CM

## 2024-04-13 DIAGNOSIS — I5022 Chronic systolic (congestive) heart failure: Secondary | ICD-10-CM

## 2024-04-14 ENCOUNTER — Other Ambulatory Visit: Payer: Self-pay

## 2024-04-14 MED ORDER — CARVEDILOL 12.5 MG PO TABS
12.5000 mg | ORAL_TABLET | Freq: Two times a day (BID) | ORAL | 0 refills | Status: DC
  Filled 2024-04-14: qty 180, 90d supply, fill #0

## 2024-04-14 MED ORDER — ATORVASTATIN CALCIUM 40 MG PO TABS
40.0000 mg | ORAL_TABLET | Freq: Every day | ORAL | 0 refills | Status: DC
  Filled 2024-04-14: qty 90, 90d supply, fill #0

## 2024-04-14 NOTE — Telephone Encounter (Signed)
 Complete

## 2024-04-16 ENCOUNTER — Other Ambulatory Visit: Payer: Self-pay

## 2024-04-17 ENCOUNTER — Other Ambulatory Visit: Payer: Self-pay

## 2024-04-30 ENCOUNTER — Other Ambulatory Visit: Payer: Self-pay

## 2024-05-01 ENCOUNTER — Other Ambulatory Visit: Payer: Self-pay

## 2024-05-04 ENCOUNTER — Other Ambulatory Visit: Payer: Self-pay

## 2024-05-12 ENCOUNTER — Other Ambulatory Visit: Payer: Self-pay

## 2024-05-12 ENCOUNTER — Other Ambulatory Visit (HOSPITAL_COMMUNITY): Payer: Self-pay

## 2024-05-12 ENCOUNTER — Ambulatory Visit: Payer: Self-pay | Admitting: Family

## 2024-05-12 ENCOUNTER — Encounter: Payer: Self-pay | Admitting: Family

## 2024-05-12 ENCOUNTER — Ambulatory Visit (INDEPENDENT_AMBULATORY_CARE_PROVIDER_SITE_OTHER): Payer: Self-pay | Admitting: Family

## 2024-05-12 VITALS — BP 133/91 | HR 93 | Temp 98.5°F | Resp 16 | Ht 65.0 in | Wt 210.0 lb

## 2024-05-12 DIAGNOSIS — E1165 Type 2 diabetes mellitus with hyperglycemia: Secondary | ICD-10-CM

## 2024-05-12 DIAGNOSIS — Z0100 Encounter for examination of eyes and vision without abnormal findings: Secondary | ICD-10-CM

## 2024-05-12 DIAGNOSIS — I10 Essential (primary) hypertension: Secondary | ICD-10-CM

## 2024-05-12 DIAGNOSIS — I5022 Chronic systolic (congestive) heart failure: Secondary | ICD-10-CM

## 2024-05-12 DIAGNOSIS — E119 Type 2 diabetes mellitus without complications: Secondary | ICD-10-CM

## 2024-05-12 DIAGNOSIS — Z7984 Long term (current) use of oral hypoglycemic drugs: Secondary | ICD-10-CM

## 2024-05-12 LAB — POCT GLYCOSYLATED HEMOGLOBIN (HGB A1C): Hemoglobin A1C: 6.6 % — AB (ref 4.0–5.6)

## 2024-05-12 MED ORDER — SPIRONOLACTONE 25 MG PO TABS
25.0000 mg | ORAL_TABLET | Freq: Every day | ORAL | 0 refills | Status: DC
  Filled 2024-05-12 – 2024-07-11 (×2): qty 90, 90d supply, fill #0

## 2024-05-12 MED ORDER — FUROSEMIDE 20 MG PO TABS
20.0000 mg | ORAL_TABLET | Freq: Every day | ORAL | 0 refills | Status: DC
  Filled 2024-05-12: qty 90, 90d supply, fill #0
  Filled 2024-05-24: qty 30, 30d supply, fill #0
  Filled 2024-06-01: qty 90, 90d supply, fill #0

## 2024-05-12 MED ORDER — METFORMIN HCL ER 500 MG PO TB24
1500.0000 mg | ORAL_TABLET | Freq: Every day | ORAL | 0 refills | Status: DC
  Filled 2024-05-12: qty 270, 90d supply, fill #0
  Filled 2024-05-12: qty 90, 30d supply, fill #0
  Filled 2024-06-10: qty 270, 90d supply, fill #0

## 2024-05-12 MED ORDER — VALSARTAN 320 MG PO TABS
320.0000 mg | ORAL_TABLET | Freq: Every day | ORAL | 0 refills | Status: DC
  Filled 2024-05-12 (×2): qty 90, 90d supply, fill #0

## 2024-05-12 NOTE — Progress Notes (Signed)
 Patient ID: [name], female    DOB: 04-Nov-1975  MRN: [id number]  CC: Chronic Conditions Follow-Up  Subjective: [name] is a 48 y.o. female who presents for chronic conditions follow-up.  Her concerns today include:  - Doing well on Valsartan , Spironolactone , Furosemide , and Carvedilol , no issues/concerns. Since previous office she did not follow-up with Cardiology. She does not complain of red flag symptoms such as but not limited to chest pain, shortness of breath, worst headache of life, nausea/vomiting.  - Doing well on Metformin  XR, Dulaglutide , and Dapagliflozin  Propanediol, no issues/concerns. Denies red flag symptoms associated with diabetes.  - Due for diabetic eye exam.   Patient Active Problem List   Diagnosis Date Noted   Trichomoniasis 03/22/2023   Chlamydia 03/22/2023   Iron  deficiency anemia 11/24/2020   Exertional dyspnea 10/04/2020   Hypertensive urgency 10/04/2020   Left lumbar radiculitis 03/23/2019   Vitamin D  deficiency disease 06/04/2018   Routine general medical examination at a health care facility 05/15/2017   Cervical cancer screening 05/15/2017   Dyslipidemia, goal LDL below 100 04/04/2017   Snoring 10/25/2016   Obesity (BMI 30-39.9) 03/09/2014   Diuretic-induced hypokalemia 02/13/2013   Diabetes mellitus without complication (HCC) 02/12/2013   Malignant hypertension 06/01/2009     Current Outpatient Medications on File Prior to Visit  Medication Sig Dispense Refill   atorvastatin  (LIPITOR) 40 MG tablet Take 1 tablet (40 mg total) by mouth daily. 90 tablet 0   Blood Glucose Monitoring Suppl (TRUE METRIX METER) w/Device KIT 1 each by Does not apply route as directed. 1 kit 0   carvedilol  (COREG ) 12.5 MG tablet Take 1 tablet (12.5 mg total) by mouth 2 (two) times daily with a meal. 180 tablet 0   dapagliflozin  propanediol (FARXIGA ) 10 MG TABS tablet Take 1 tablet (10 mg total) by mouth daily. 90 tablet 3   Dulaglutide  (TRULICITY ) 1.5  MG/0.5ML SOAJ Inject 1.5 mg into the skin once a week. 2 mL 6   glucose blood (TRUE METRIX BLOOD GLUCOSE TEST) test strip Use as instructed 100 each 12   levonorgestrel (MIRENA) 20 MCG/24HR IUD 1 each by Intrauterine route once. 1 each 0   TRUEplus Lancets 28G MISC 1 each by Does not apply route daily. 100 each 2   Current Facility-Administered Medications on File Prior to Visit  Medication Dose Route Frequency Provider Last Rate Last Admin   hydrALAZINE  (APRESOLINE ) tablet 25 mg  25 mg Oral Once Lorren Greig PARAS, NP        No Known Allergies  Social History   Socioeconomic History   Marital status: Single    Spouse name: Not on file   Number of children: 2   Years of education: Not on file   Highest education level: Bachelor's degree (e.g., BA, AB, BS)  Occupational History    Employer: [employer]  Tobacco Use   Smoking status: Never    Passive exposure: Never   Smokeless tobacco: Never  Vaping Use   Vaping status: Never Used  Substance and Sexual Activity   Alcohol use: Yes   Drug use: No   Sexual activity: Yes    Birth control/protection: I.U.D.    Comment: Mirena IUD inserted 2012?,intercourse age 56, more than 5 sexual partners  Other Topics Concern   Not on file  Social History Narrative   Not on file   Social Drivers of Health   Financial Resource Strain: Medium Risk (02/26/2023)   Overall Financial Resource Strain (CARDIA)  Difficulty of Paying Living Expenses: Somewhat hard  Food Insecurity: Food Insecurity Present (02/26/2023)   Hunger Vital Sign    Worried About Running Out of Food in the Last Year: Sometimes true    Ran Out of Food in the Last Year: Sometimes true  Transportation Needs: No Transportation Needs (02/26/2023)   PRAPARE - Administrator, Civil Service (Medical): No    Lack of Transportation (Non-Medical): No  Physical Activity: Sufficiently Active (02/26/2023)   Exercise Vital Sign    Days of Exercise per Week: 3 days    Minutes of  Exercise per Session: 150+ min  Stress: Stress Concern Present (02/26/2023)   [employer] of Occupational Health - Occupational Stress Questionnaire    Feeling of Stress : To some extent  Social Connections: Moderately Isolated (02/26/2023)   Social Connection and Isolation Panel    Frequency of Communication with Friends and Family: Twice a week    Frequency of Social Gatherings with Friends and Family: Three times a week    Attends Religious Services: 1 to 4 times per year    Active Member of Clubs or Organizations: No    Attends Engineer, structural: Not on file    Marital Status: Never married  Catering manager Violence: Not on file    Family History  Problem Relation Age of Onset   Diabetes Mother    Heart disease Mother    Hypertension Maternal Aunt    Breast cancer Neg Hx     Past Surgical History:  Procedure Laterality Date   CESAREAN SECTION  2006    ROS: Review of Systems Negative except as stated above  PHYSICAL EXAM: BP (!) 133/91   Pulse 93   Temp 98.5 F (36.9 C) (Oral)   Resp 16   Ht 5' 5 (1.651 m)   Wt 210 lb (95.3 kg)   SpO2 93%   BMI 34.95 kg/m   Physical Exam HENT:     Head: Normocephalic and atraumatic.     Nose: Nose normal.     Mouth/Throat:     Mouth: Mucous membranes are moist.     Pharynx: Oropharynx is clear.  Eyes:     Extraocular Movements: Extraocular movements intact.     Conjunctiva/sclera: Conjunctivae normal.     Pupils: Pupils are equal, round, and reactive to light.  Cardiovascular:     Rate and Rhythm: Normal rate and regular rhythm.     Pulses: Normal pulses.     Heart sounds: Normal heart sounds.  Pulmonary:     Effort: Pulmonary effort is normal.     Breath sounds: Normal breath sounds.  Musculoskeletal:        General: Normal range of motion.     Cervical back: Normal range of motion and neck supple.  Neurological:     General: No focal deficit present.     Mental Status: She is alert and oriented  to person, place, and time.  Psychiatric:        Mood and Affect: Mood normal.        Behavior: Behavior normal.     ASSESSMENT AND PLAN: 1. Primary hypertension (Primary) 2. Malignant hypertension 3. Chronic systolic heart failure (HCC) - Blood pressure not at goal during today's visit. Patient asymptomatic without chest pressure, chest pain, palpitations, shortness of breath, worst headache of life, and any additional red flag symptoms. - Continue Carvedilol  as prescribed. No refills needed as of present.  - Continue Valsartan , Spironolactone , and Furosemide   as prescribed.  - Counseled on blood pressure goal of less than 130/80, low-sodium, DASH diet, medication compliance, and 150 minutes of moderate intensity exercise per week as tolerated. Counseled on medication adherence and adverse effects. - Referral to Cardiology for evaluation/management. - Follow-up with primary provider as scheduled.  - furosemide  (LASIX ) 20 MG tablet; Take 1 tablet (20 mg total) by mouth daily.  Dispense: 90 tablet; Refill: 0 - spironolactone  (ALDACTONE ) 25 MG tablet; Take 1 tablet (25 mg total) by mouth at bedtime.  Dispense: 90 tablet; Refill: 0 - valsartan  (DIOVAN ) 320 MG tablet; Take 1 tablet (320 mg total) by mouth daily.  Dispense: 90 tablet; Refill: 0 - Ambulatory referral to Cardiology  4. Type 2 diabetes mellitus with hyperglycemia, without long-term current use of insulin  (HCC) - Hemoglobin A1c at goal at 6.6%. Goal 7%.  - Continue Dulaglutide  and Dapaglifllozin Propanediol as prescribed. No refills needed as of present.  - Continue Metformin  XR as prescribed.  - Discussed the importance of healthy eating habits, low-carbohydrate diet, low-sugar diet, regular aerobic exercise (at least 150 minutes a week as tolerated) and medication compliance to achieve or maintain control of diabetes. Counseled on medication adherence/adverse effects.  - Follow-up with primary provider in 3 months or sooner if  needed. - POCT glycosylated hemoglobin (Hb A1C) - metFORMIN  (GLUCOPHAGE -XR) 500 MG 24 hr tablet; Take 3 tablets (1,500 mg total) by mouth daily with breakfast.  Dispense: 270 tablet; Refill: 0  5. Diabetic eye exam [facility]) - Referral to Ophthalmology for evaluation/management. - Ambulatory referral to Ophthalmology   Patient was given the opportunity to ask questions.  Patient verbalized understanding of the plan and was able to repeat key elements of the plan. Patient was given clear instructions to go to Emergency Department or return to medical center if symptoms don't improve, worsen, or new problems develop.The patient verbalized understanding.   Orders Placed This Encounter  Procedures   Ambulatory referral to Cardiology   Ambulatory referral to Ophthalmology   POCT glycosylated hemoglobin (Hb A1C)     Requested Prescriptions   Signed Prescriptions Disp Refills   furosemide  (LASIX ) 20 MG tablet 90 tablet 0    Sig: Take 1 tablet (20 mg total) by mouth daily.   metFORMIN  (GLUCOPHAGE -XR) 500 MG 24 hr tablet 270 tablet 0    Sig: Take 3 tablets (1,500 mg total) by mouth daily with breakfast.   spironolactone  (ALDACTONE ) 25 MG tablet 90 tablet 0    Sig: Take 1 tablet (25 mg total) by mouth at bedtime.   valsartan  (DIOVAN ) 320 MG tablet 90 tablet 0    Sig: Take 1 tablet (320 mg total) by mouth daily.    Return for Follow-up as needed.  Greig JINNY Drones, NP

## 2024-05-13 ENCOUNTER — Other Ambulatory Visit: Payer: Self-pay

## 2024-05-14 ENCOUNTER — Other Ambulatory Visit: Payer: Self-pay

## 2024-05-15 ENCOUNTER — Other Ambulatory Visit: Payer: Self-pay

## 2024-05-24 ENCOUNTER — Other Ambulatory Visit (HOSPITAL_COMMUNITY): Payer: Self-pay

## 2024-05-25 ENCOUNTER — Other Ambulatory Visit (HOSPITAL_COMMUNITY): Payer: Self-pay

## 2024-06-01 ENCOUNTER — Other Ambulatory Visit (HOSPITAL_COMMUNITY): Payer: Self-pay

## 2024-06-01 ENCOUNTER — Other Ambulatory Visit: Payer: Self-pay

## 2024-06-10 ENCOUNTER — Other Ambulatory Visit: Payer: Self-pay

## 2024-06-18 ENCOUNTER — Other Ambulatory Visit: Payer: Self-pay

## 2024-06-19 ENCOUNTER — Other Ambulatory Visit: Payer: Self-pay

## 2024-07-11 ENCOUNTER — Other Ambulatory Visit: Payer: Self-pay | Admitting: Family

## 2024-07-11 DIAGNOSIS — I10 Essential (primary) hypertension: Secondary | ICD-10-CM

## 2024-07-11 DIAGNOSIS — I5022 Chronic systolic (congestive) heart failure: Secondary | ICD-10-CM

## 2024-07-12 ENCOUNTER — Other Ambulatory Visit: Payer: Self-pay | Admitting: Family

## 2024-07-12 DIAGNOSIS — E785 Hyperlipidemia, unspecified: Secondary | ICD-10-CM

## 2024-07-13 ENCOUNTER — Other Ambulatory Visit: Payer: Self-pay

## 2024-07-13 MED ORDER — CARVEDILOL 12.5 MG PO TABS
12.5000 mg | ORAL_TABLET | Freq: Two times a day (BID) | ORAL | 0 refills | Status: AC
  Filled 2024-07-13: qty 180, 90d supply, fill #0

## 2024-07-13 MED ORDER — ATORVASTATIN CALCIUM 40 MG PO TABS
40.0000 mg | ORAL_TABLET | Freq: Every day | ORAL | 0 refills | Status: DC
  Filled 2024-07-13 (×2): qty 90, 90d supply, fill #0

## 2024-07-13 NOTE — Telephone Encounter (Signed)
 Complete

## 2024-07-14 ENCOUNTER — Other Ambulatory Visit: Payer: Self-pay

## 2024-08-09 ENCOUNTER — Other Ambulatory Visit: Payer: Self-pay | Admitting: Family

## 2024-08-09 DIAGNOSIS — I5022 Chronic systolic (congestive) heart failure: Secondary | ICD-10-CM

## 2024-08-09 DIAGNOSIS — I10 Essential (primary) hypertension: Secondary | ICD-10-CM

## 2024-08-10 ENCOUNTER — Other Ambulatory Visit: Payer: Self-pay

## 2024-08-10 MED ORDER — VALSARTAN 320 MG PO TABS
320.0000 mg | ORAL_TABLET | Freq: Every day | ORAL | 0 refills | Status: DC
  Filled 2024-08-10: qty 90, 90d supply, fill #0

## 2024-08-10 NOTE — Telephone Encounter (Signed)
 Complete

## 2024-08-11 ENCOUNTER — Ambulatory Visit: Admitting: Family

## 2024-09-10 ENCOUNTER — Other Ambulatory Visit: Payer: Self-pay | Admitting: Family

## 2024-09-10 DIAGNOSIS — E1165 Type 2 diabetes mellitus with hyperglycemia: Secondary | ICD-10-CM

## 2024-09-10 DIAGNOSIS — I10 Essential (primary) hypertension: Secondary | ICD-10-CM

## 2024-09-10 DIAGNOSIS — I5022 Chronic systolic (congestive) heart failure: Secondary | ICD-10-CM

## 2024-09-11 ENCOUNTER — Other Ambulatory Visit: Payer: Self-pay

## 2024-09-11 MED ORDER — FUROSEMIDE 20 MG PO TABS
20.0000 mg | ORAL_TABLET | Freq: Every day | ORAL | 0 refills | Status: DC
  Filled 2024-09-11: qty 30, 30d supply, fill #0

## 2024-09-11 MED ORDER — METFORMIN HCL ER 500 MG PO TB24
1500.0000 mg | ORAL_TABLET | Freq: Every day | ORAL | 0 refills | Status: DC
  Filled 2024-09-11: qty 270, 90d supply, fill #0

## 2024-09-11 NOTE — Telephone Encounter (Signed)
 Complete

## 2024-09-16 ENCOUNTER — Encounter: Payer: Self-pay | Admitting: Family

## 2024-09-16 ENCOUNTER — Other Ambulatory Visit: Payer: Self-pay

## 2024-09-16 ENCOUNTER — Ambulatory Visit (INDEPENDENT_AMBULATORY_CARE_PROVIDER_SITE_OTHER): Payer: Self-pay | Admitting: Family

## 2024-09-16 ENCOUNTER — Other Ambulatory Visit (HOSPITAL_COMMUNITY): Payer: Self-pay

## 2024-09-16 VITALS — BP 167/118 | HR 90 | Ht 65.0 in | Wt 212.0 lb

## 2024-09-16 DIAGNOSIS — E119 Type 2 diabetes mellitus without complications: Secondary | ICD-10-CM

## 2024-09-16 DIAGNOSIS — E1165 Type 2 diabetes mellitus with hyperglycemia: Secondary | ICD-10-CM

## 2024-09-16 DIAGNOSIS — I5022 Chronic systolic (congestive) heart failure: Secondary | ICD-10-CM

## 2024-09-16 DIAGNOSIS — I10 Essential (primary) hypertension: Secondary | ICD-10-CM

## 2024-09-16 DIAGNOSIS — E785 Hyperlipidemia, unspecified: Secondary | ICD-10-CM

## 2024-09-16 DIAGNOSIS — Z7984 Long term (current) use of oral hypoglycemic drugs: Secondary | ICD-10-CM

## 2024-09-16 MED ORDER — METFORMIN HCL ER 500 MG PO TB24
1500.0000 mg | ORAL_TABLET | Freq: Every day | ORAL | 0 refills | Status: AC
  Filled 2024-09-16: qty 270, 90d supply, fill #0

## 2024-09-16 MED ORDER — VALSARTAN 320 MG PO TABS
320.0000 mg | ORAL_TABLET | Freq: Every day | ORAL | 0 refills | Status: AC
  Filled 2024-09-16: qty 90, 90d supply, fill #0

## 2024-09-16 MED ORDER — FUROSEMIDE 20 MG PO TABS
20.0000 mg | ORAL_TABLET | Freq: Every day | ORAL | 0 refills | Status: AC
  Filled 2024-09-16 – 2024-09-25 (×2): qty 90, 90d supply, fill #0

## 2024-09-16 MED ORDER — ATORVASTATIN CALCIUM 40 MG PO TABS
40.0000 mg | ORAL_TABLET | Freq: Every day | ORAL | 0 refills | Status: AC
  Filled 2024-09-16: qty 90, 90d supply, fill #0

## 2024-09-16 MED ORDER — SPIRONOLACTONE 50 MG PO TABS
50.0000 mg | ORAL_TABLET | Freq: Every day | ORAL | 0 refills | Status: AC
  Filled 2024-09-16 – 2024-09-25 (×2): qty 90, 90d supply, fill #0

## 2024-09-16 MED ORDER — BLOOD PRESSURE MONITOR DEVI
1.0000 | Freq: Every day | 0 refills | Status: AC
  Filled 2024-09-16: qty 1, 30d supply, fill #0

## 2024-09-16 NOTE — Progress Notes (Signed)
 "   Patient ID: [name], female    DOB: [birth year]  MRN: [id number]  CC: Chronic Conditions Follow-Up  Subjective: [name] is a 49 y.o. female who presents for chronic conditions follow-up.   Her concerns today include:  - Doing well on Furosemide , Spironolactone , and Valsartan , no issues/concerns. She does not check blood pressure outside of office. She is limiting salt intake. She exercises when able to do so. She does not complain of red flag symptoms such as but not limited to chest pain, shortness of breath, worst headache of life, nausea/vomiting. States she forgot to schedule an appointment with Cardiology and plans to do so soon.  - Doing well on Metformin  XR, no issues/concerns. Denies red flag symptoms associated with diabetes.  - Due for diabetic eye exam.  - Doing well on Atorvastatin , no issues/concerns.   Patient Active Problem List   Diagnosis Date Noted   Trichomoniasis 03/22/2023   Chlamydia 03/22/2023   Iron  deficiency anemia 11/24/2020   Exertional dyspnea 10/04/2020   Hypertensive urgency 10/04/2020   Left lumbar radiculitis 03/23/2019   Vitamin D  deficiency disease 06/04/2018   Routine general medical examination at a health care facility 05/15/2017   Cervical cancer screening 05/15/2017   Dyslipidemia, goal LDL below 100 04/04/2017   Snoring 10/25/2016   Obesity (BMI 30-39.9) 03/09/2014   Diuretic-induced hypokalemia 02/13/2013   Diabetes mellitus without complication (HCC) 02/12/2013   Malignant hypertension 06/01/2009     Medications Ordered Prior to Encounter[1]  Allergies[2]  Social History   Socioeconomic History   Marital status: Single    Spouse name: Not on file   Number of children: 2   Years of education: Not on file   Highest education level: Bachelor's degree (e.g., BA, AB, BS)  Occupational History    Employer: [employer]  Tobacco Use   Smoking status: Never    Passive exposure: Never   Smokeless tobacco: Never  Vaping  Use   Vaping status: Never Used  Substance and Sexual Activity   Alcohol use: Yes   Drug use: No   Sexual activity: Not Currently    Comment: Mirena IUD inserted 2012?,intercourse age 71, more than 5 sexual partners  Other Topics Concern   Not on file  Social History Narrative   Not on file   Social Drivers of Health   Tobacco Use: Low Risk (05/12/2024)   Patient History    Smoking Tobacco Use: Never    Smokeless Tobacco Use: Never    Passive Exposure: Never  Financial Resource Strain: Medium Risk (02/26/2023)   Overall Financial Resource Strain (CARDIA)    Difficulty of Paying Living Expenses: Somewhat hard  Food Insecurity: Food Insecurity Present (02/26/2023)   Hunger Vital Sign    Worried About Running Out of Food in the Last Year: Sometimes true    Ran Out of Food in the Last Year: Sometimes true  Transportation Needs: No Transportation Needs (02/26/2023)   PRAPARE - Administrator, Civil Service (Medical): No    Lack of Transportation (Non-Medical): No  Physical Activity: Sufficiently Active (02/26/2023)   Exercise Vital Sign    Days of Exercise per Week: 3 days    Minutes of Exercise per Session: 150+ min  Stress: Stress Concern Present (02/26/2023)   [employer] of Occupational Health - Occupational Stress Questionnaire    Feeling of Stress : To some extent  Social Connections: Moderately Isolated (02/26/2023)   Social Connection and Isolation Panel    Frequency of Communication  with Friends and Family: Twice a week    Frequency of Social Gatherings with Friends and Family: Three times a week    Attends Religious Services: 1 to 4 times per year    Active Member of Clubs or Organizations: No    Attends Banker Meetings: Not on file    Marital Status: Never married  Intimate Partner Violence: Not on file  Depression (PHQ2-9): Low Risk (05/12/2024)   Depression (PHQ2-9)    PHQ-2 Score: 0  Alcohol Screen: Low Risk (02/26/2023)   Alcohol Screen     Last Alcohol Screening Score (AUDIT): 2  Housing: Low Risk (02/26/2023)   Housing    Last Housing Risk Score: 0  Utilities: Not on file  Health Literacy: Not on file    Family History  Problem Relation Age of Onset   Diabetes Mother    Heart disease Mother    Hypertension Maternal Aunt    Breast cancer Neg Hx     Past Surgical History:  Procedure Laterality Date   CESAREAN SECTION  2006    ROS: Review of Systems Negative except as stated above  PHYSICAL EXAM: BP (!) 167/118   Pulse 90   Ht 5' 5 (1.651 m)   Wt 212 lb (96.2 kg)   LMP  (LMP Unknown)   SpO2 94%   BMI 35.28 kg/m   Physical Exam HENT:     Head: Normocephalic and atraumatic.     Nose: Nose normal.     Mouth/Throat:     Mouth: Mucous membranes are moist.     Pharynx: Oropharynx is clear.  Eyes:     Extraocular Movements: Extraocular movements intact.     Conjunctiva/sclera: Conjunctivae normal.     Pupils: Pupils are equal, round, and reactive to light.  Cardiovascular:     Rate and Rhythm: Normal rate and regular rhythm.     Pulses: Normal pulses.     Heart sounds: Normal heart sounds.  Pulmonary:     Effort: Pulmonary effort is normal.     Breath sounds: Normal breath sounds.  Musculoskeletal:        General: Normal range of motion.     Cervical back: Normal range of motion and neck supple.  Neurological:     General: No focal deficit present.     Mental Status: She is alert and oriented to person, place, and time.  Psychiatric:        Mood and Affect: Mood normal.        Behavior: Behavior normal.     ASSESSMENT AND PLAN: 1. Primary hypertension (Primary) 2. Malignant hypertension 3. Chronic systolic heart failure (HCC) - Blood pressure not at goal during today's visit. Patient asymptomatic without chest pressure, chest pain, palpitations, shortness of breath, worst headache of life, and any additional red flag symptoms. - Continue Valsartan  and Furosemide  as prescribed.  - Increase  Spironolactone  from 25 mg to 50 mg as prescribed.  - Blood pressure device prescribed.  - Routine screening.  - Counseled on blood pressure goal of less than 130/80, low-sodium, DASH diet, medication compliance, and 150 minutes of moderate intensity exercise per week as tolerated. Counseled on medication adherence and adverse effects. - Referral to Cardiology for evaluation/management.  - Follow-up with primary provider as scheduled.  - spironolactone  (ALDACTONE ) 50 MG tablet; Take 1 tablet (50 mg total) by mouth at bedtime.  Dispense: 90 tablet; Refill: 0 - Ambulatory referral to Cardiology - Basic Metabolic Panel - furosemide  (LASIX ) 20 MG  tablet; Take 1 tablet (20 mg total) by mouth daily.  Dispense: 90 tablet; Refill: 0 - valsartan  (DIOVAN ) 320 MG tablet; Take 1 tablet (320 mg total) by mouth daily.  Dispense: 90 tablet; Refill: 0 - Blood Pressure Monitor DEVI; 1 kit by Other route daily. Use as directed to check home blood pressure 2-3 times a week  Dispense: 1 each; Refill: 0  4. Type 2 diabetes mellitus with hyperglycemia, without long-term current use of insulin  (HCC) - Hemoglobin A1c result pending.  - Continue Metformin  XR as prescribed.  - Discussed the importance of healthy eating habits, low-carbohydrate diet, low-sugar diet, regular aerobic exercise (at least 150 minutes a week as tolerated) and medication compliance to achieve or maintain control of diabetes. Counseled on medication adherence/adverse effects.  - Follow-up with primary provider as scheduled.  - Hemoglobin A1c - metFORMIN  (GLUCOPHAGE -XR) 500 MG 24 hr tablet; Take 3 tablets (1,500 mg total) by mouth daily with breakfast.  Dispense: 270 tablet; Refill: 0  5. Diabetic eye exam [facility]) - Referral to Ophthalmology for evaluation/management.  - Ambulatory referral to Ophthalmology  6. Hyperlipidemia, unspecified hyperlipidemia type - Continue Atorvastatin  as prescribed. Counseled on medication adherence/adverse  effects.  - Routine screening.  - Follow-up with primary provider as scheduled.  - atorvastatin  (LIPITOR) 40 MG tablet; Take 1 tablet (40 mg total) by mouth daily.  Dispense: 90 tablet; Refill: 0 - Lipid panel; Future   Patient was given the opportunity to ask questions.  Patient verbalized understanding of the plan and was able to repeat key elements of the plan. Patient was given clear instructions to go to Emergency Department or return to medical center if symptoms don't improve, worsen, or new problems develop.The patient verbalized understanding.   Orders Placed This Encounter  Procedures   Basic Metabolic Panel   Hemoglobin A1c   Lipid panel   Ambulatory referral to Ophthalmology   Ambulatory referral to Cardiology     Requested Prescriptions   Signed Prescriptions Disp Refills   spironolactone  (ALDACTONE ) 50 MG tablet 90 tablet 0    Sig: Take 1 tablet (50 mg total) by mouth at bedtime.   furosemide  (LASIX ) 20 MG tablet 90 tablet 0    Sig: Take 1 tablet (20 mg total) by mouth daily.   valsartan  (DIOVAN ) 320 MG tablet 90 tablet 0    Sig: Take 1 tablet (320 mg total) by mouth daily.   metFORMIN  (GLUCOPHAGE -XR) 500 MG 24 hr tablet 270 tablet 0    Sig: Take 3 tablets (1,500 mg total) by mouth daily with breakfast.   Blood Pressure Monitor DEVI 1 each 0    Sig: 1 kit by Other route daily. Use as directed to check home blood pressure 2-3 times a week   atorvastatin  (LIPITOR) 40 MG tablet 90 tablet 0    Sig: Take 1 tablet (40 mg total) by mouth daily.    Return for Follow-up as needed.  Greig JINNY Chute, NP      [1]  Current Outpatient Medications on File Prior to Visit  Medication Sig Dispense Refill   carvedilol  (COREG ) 12.5 MG tablet Take 1 tablet (12.5 mg total) by mouth 2 (two) times daily with a meal. 180 tablet 0   dapagliflozin  propanediol (FARXIGA ) 10 MG TABS tablet Take 1 tablet (10 mg total) by mouth daily. 90 tablet 3   Dulaglutide  (TRULICITY ) 1.5 MG/0.5ML SOAJ  Inject 1.5 mg into the skin once a week. 2 mL 6   Blood Glucose Monitoring Suppl (TRUE METRIX METER)  w/Device KIT 1 each by Does not apply route as directed. (Patient not taking: Reported on 09/16/2024) 1 kit 0   glucose blood (TRUE METRIX BLOOD GLUCOSE TEST) test strip Use as instructed (Patient not taking: Reported on 09/16/2024) 100 each 12   levonorgestrel (MIRENA) 20 MCG/24HR IUD 1 each by Intrauterine route once. (Patient not taking: Reported on 09/16/2024) 1 each 0   TRUEplus Lancets 28G MISC 1 each by Does not apply route daily. (Patient not taking: Reported on 09/16/2024) 100 each 2   Current Facility-Administered Medications on File Prior to Visit  Medication Dose Route Frequency Provider Last Rate Last Admin   hydrALAZINE  (APRESOLINE ) tablet 25 mg  25 mg Oral Once Jaycee Greig PARAS, NP      [2] No Known Allergies  "

## 2024-09-16 NOTE — Progress Notes (Signed)
 CARDIOLOGY CONSULT NOTE       Patient ID: [name] MRN: [id number] DOB/AGE: 01-20-1976 49 y.o.  Referring Physician: Jaycee Primary Physician: Jaycee Greig PARAS, NP Primary Cardiologist: New Reason for Consultation: HTN/CHF   HPI:  49 y.o. referred by NP Jaycee for poorly controlled HTN and history of systolic CHF. Has not been seen in cardiology since 2022.  Previously followed by CHF clinic. History of HTN, HLD, DM-2. Issues in past with insurance and med non compliance. At that time working part time at [employer] and has 2 sons. Last echo done 09/2020 EF 40-45%  Seen by primary 1/27 and BP high. She was on valsartan  and lasix  aldactone  increased to 50 mg She was already on lasix  20 mg daily and coreg  12.5 mg bid.    She denies volume overload, chest pain , palpitations or dyspnea. She tries to follow low sodium diet and no excess ETOH. 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 Eats out at [employer] 4 x/ week. Has not started taking increased dose of Aldactone  yet  Still does not have insurance   ROS All other systems reviewed and negative except as noted above  Past Medical History:  Diagnosis Date   CHF (congestive heart failure) (HCC)    Diabetes mellitus    HISTORY OF GESTATIONAL DIABETES   Hypertension    IUD    MIRENA INSERTED 01/24/11    Family History  Problem Relation Age of Onset   Diabetes Mother    Heart disease Mother    Hypertension Maternal Aunt    Breast cancer Neg Hx     Social History   Socioeconomic History   Marital status: Single    Spouse name: Not on file   Number of children: 2   Years of education: Not on file   Highest education level: Bachelor's degree (e.g., BA, AB, BS)  Occupational History    Employer: [employer]  Tobacco Use   Smoking status: Never    Passive exposure: Never   Smokeless tobacco: Never  Vaping Use   Vaping  status: Never Used  Substance and Sexual Activity   Alcohol use: Yes   Drug use: No   Sexual activity: Not Currently    Comment: Mirena IUD inserted 2012?,intercourse age 26, more than 5 sexual partners  Other Topics Concern   Not on file  Social History Narrative   Not on file   Social Drivers of Health   Tobacco Use: Low Risk (09/18/2024)   Patient History    Smoking Tobacco Use: Never    Smokeless Tobacco Use: Never    Passive Exposure: Never  Financial Resource Strain: Medium Risk (02/26/2023)   Overall Financial Resource Strain (CARDIA)    Difficulty of Paying Living Expenses: Somewhat hard  Food Insecurity: Food Insecurity Present (02/26/2023)   Hunger Vital Sign    Worried About Running Out of Food in the Last Year: Sometimes true    Ran Out of Food in the Last Year: Sometimes true  Transportation Needs: No Transportation Needs (02/26/2023)   PRAPARE - Administrator, Civil Service (Medical): No    Lack of Transportation (Non-Medical): No  Physical Activity: Sufficiently Active (02/26/2023)   Exercise Vital Sign    Days of Exercise per Week: 3 days    Minutes of Exercise per Session: 150+  min  Stress: Stress Concern Present (02/26/2023)   [employer] of Occupational Health - Occupational Stress Questionnaire    Feeling of Stress : To some extent  Social Connections: Moderately Isolated (02/26/2023)   Social Connection and Isolation Panel    Frequency of Communication with Friends and Family: Twice a week    Frequency of Social Gatherings with Friends and Family: Three times a week    Attends Religious Services: 1 to 4 times per year    Active Member of Clubs or Organizations: No    Attends Banker Meetings: Not on file    Marital Status: Never married  Intimate Partner Violence: Not on file  Depression (PHQ2-9): Low Risk (05/12/2024)   Depression (PHQ2-9)    PHQ-2 Score: 0  Alcohol Screen: Low Risk (02/26/2023)   Alcohol Screen    Last Alcohol  Screening Score (AUDIT): 2  Housing: Low Risk (02/26/2023)   Housing    Last Housing Risk Score: 0  Utilities: Not on file  Health Literacy: Not on file    Past Surgical History:  Procedure Laterality Date   CESAREAN SECTION  2006     Current Medications[1]  hydrALAZINE   25 mg Oral Once     Physical Exam: Blood pressure (!) 169/129, pulse 85, height 5' 5 (1.651 m), weight 207 lb 12.8 oz (94.3 kg), SpO2 94%.    Affect appropriate Healthy:  appears stated age HEENT: normal Neck supple with no adenopathy JVP normal no bruits no thyromegaly Lungs clear with no wheezing and good diaphragmatic motion Heart:  S1/S2 no murmur, no rub, gallop or click PMI normal Abdomen: benighn, BS positve, no tenderness, no AAA no bruit.  No HSM or HJR Distal pulses intact with no bruits No edema Neuro non-focal Skin warm and dry No muscular weakness   Labs:   Lab Results  Component Value Date   WBC 8.1 11/23/2020   HGB 13.5 11/23/2020   HCT 43.6 11/23/2020   MCV 76 (L) 11/23/2020   PLT 241 11/23/2020    Recent Labs  Lab 09/16/24 1509  NA CANCELED  K CANCELED  CL CANCELED  CO2 CANCELED  BUN CANCELED  CREATININE CANCELED  CALCIUM  CANCELED  GLUCOSE CANCELED   Lab Results  Component Value Date   CKTOTAL 300 (H) 04/26/2009   CKMB 7.0 (H) 04/26/2009   TROPONINI <0.03 04/05/2017    Lab Results  Component Value Date   CHOL 175 09/16/2024   CHOL 189 05/20/2023   CHOL 206 (H) 02/27/2023   Lab Results  Component Value Date   HDL 76 09/16/2024   HDL 65 05/20/2023   HDL 78 02/27/2023   Lab Results  Component Value Date   LDLCALC 87 09/16/2024   LDLCALC 101 (H) 05/20/2023   LDLCALC 108 (H) 02/27/2023   Lab Results  Component Value Date   TRIG 63 09/16/2024   TRIG 134 05/20/2023   TRIG 115 02/27/2023   Lab Results  Component Value Date   CHOLHDL 2.3 09/16/2024   CHOLHDL 2.9 05/20/2023   CHOLHDL 2.6 02/27/2023   No results found for: LDLDIRECT     Radiology: No results found.  EKG: SR rate 85 RAE poor R wave progression    ASSESSMENT AND PLAN:   HTN:  meds adjusted by primary 1/28 with increase in aldactone  continue valsartan , lasix , coreg  Check home readings DASH low sodium diet. Check renal duplex r/o RAS . She has not started higher dose aldactone  yet Can consider adding hydralazine  next if needed  CHF:  not volume overloaded. Primary did not check BNP. Needs updated TTE Prior echo 2022 with EF 40-45% If worse can consider changing valsartan  to entresto  DM:  A1c in 6.6 range On metformin , turlicity and farxiga  HLD:  continue lipitor labs with primary   Echo systolic CHF Renal duplex r/o RAS BMET /BNP   F/U in 4-6 weeks with HTN clinic   Signed: Maude Emmer 09/18/2024, 3:04 PM      [1]  Current Outpatient Medications:    atorvastatin  (LIPITOR) 40 MG tablet, Take 1 tablet (40 mg total) by mouth daily., Disp: 90 tablet, Rfl: 0   Blood Pressure Monitor DEVI, Use as directed to check home blood pressure 2-3 times a week, Disp: 1 each, Rfl: 0   carvedilol  (COREG ) 12.5 MG tablet, Take 1 tablet (12.5 mg total) by mouth 2 (two) times daily with a meal., Disp: 180 tablet, Rfl: 0   dapagliflozin  propanediol (FARXIGA ) 10 MG TABS tablet, Take 1 tablet (10 mg total) by mouth daily., Disp: 90 tablet, Rfl: 3   Dulaglutide  (TRULICITY ) 1.5 MG/0.5ML SOAJ, Inject 1.5 mg into the skin once a week., Disp: 2 mL, Rfl: 6   furosemide  (LASIX ) 20 MG tablet, Take 1 tablet (20 mg total) by mouth daily., Disp: 90 tablet, Rfl: 0   metFORMIN  (GLUCOPHAGE -XR) 500 MG 24 hr tablet, Take 3 tablets (1,500 mg total) by mouth daily with breakfast., Disp: 270 tablet, Rfl: 0   spironolactone  (ALDACTONE ) 50 MG tablet, Take 1 tablet (50 mg total) by mouth at bedtime., Disp: 90 tablet, Rfl: 0   valsartan  (DIOVAN ) 320 MG tablet, Take 1 tablet (320 mg total) by mouth daily., Disp: 90 tablet, Rfl: 0   Blood Glucose Monitoring Suppl (TRUE METRIX METER) w/Device KIT,  1 each by Does not apply route as directed. (Patient not taking: Reported on 09/16/2024), Disp: 1 kit, Rfl: 0   glucose blood (TRUE METRIX BLOOD GLUCOSE TEST) test strip, Use as instructed (Patient not taking: Reported on 09/16/2024), Disp: 100 each, Rfl: 12   levonorgestrel (MIRENA) 20 MCG/24HR IUD, 1 each by Intrauterine route once. (Patient not taking: Reported on 09/16/2024), Disp: 1 each, Rfl: 0   TRUEplus Lancets 28G MISC, 1 each by Does not apply route daily. (Patient not taking: Reported on 09/16/2024), Disp: 100 each, Rfl: 2  Current Facility-Administered Medications:    hydrALAZINE  (APRESOLINE ) tablet 25 mg, 25 mg, Oral, Once, Jaycee Greig PARAS, NP

## 2024-09-17 ENCOUNTER — Other Ambulatory Visit: Payer: Self-pay

## 2024-09-17 NOTE — Progress Notes (Signed)
Patient came in for labs Patient tolerated well

## 2024-09-18 ENCOUNTER — Ambulatory Visit: Payer: Self-pay | Attending: Cardiovascular Disease | Admitting: Cardiovascular Disease

## 2024-09-18 ENCOUNTER — Telehealth: Payer: Self-pay | Admitting: Cardiovascular Disease

## 2024-09-18 ENCOUNTER — Ambulatory Visit: Payer: Self-pay | Admitting: Family

## 2024-09-18 ENCOUNTER — Encounter: Payer: Self-pay | Admitting: Cardiovascular Disease

## 2024-09-18 VITALS — BP 169/129 | HR 85 | Ht 65.0 in | Wt 207.8 lb

## 2024-09-18 DIAGNOSIS — Z13228 Encounter for screening for other metabolic disorders: Secondary | ICD-10-CM

## 2024-09-18 DIAGNOSIS — R0609 Other forms of dyspnea: Secondary | ICD-10-CM

## 2024-09-18 DIAGNOSIS — E785 Hyperlipidemia, unspecified: Secondary | ICD-10-CM

## 2024-09-18 DIAGNOSIS — T502X5A Adverse effect of carbonic-anhydrase inhibitors, benzothiadiazides and other diuretics, initial encounter: Secondary | ICD-10-CM

## 2024-09-18 DIAGNOSIS — I160 Hypertensive urgency: Secondary | ICD-10-CM

## 2024-09-18 DIAGNOSIS — E876 Hypokalemia: Secondary | ICD-10-CM

## 2024-09-18 DIAGNOSIS — Z8679 Personal history of other diseases of the circulatory system: Secondary | ICD-10-CM

## 2024-09-18 LAB — BASIC METABOLIC PANEL WITH GFR

## 2024-09-18 LAB — LIPID PANEL
Chol/HDL Ratio: 2.3 ratio (ref 0.0–4.4)
Cholesterol, Total: 175 mg/dL (ref 100–199)
HDL: 76 mg/dL
LDL Chol Calc (NIH): 87 mg/dL (ref 0–99)
Triglycerides: 63 mg/dL (ref 0–149)
VLDL Cholesterol Cal: 12 mg/dL (ref 5–40)

## 2024-09-18 LAB — HEMOGLOBIN A1C
Est. average glucose Bld gHb Est-mCnc: 143 mg/dL
Hgb A1c MFr Bld: 6.6 % — ABNORMAL HIGH (ref 4.8–5.6)

## 2024-09-18 NOTE — Patient Instructions (Addendum)
 Medication Instructions:  Your physician recommends that you continue on your current medications as directed. Please refer to the Current Medication list given to you today.  *If you need a refill on your cardiac medications before your next appointment, please call your pharmacy*  Lab Work: BMP, BNP today If you have labs (blood work) drawn today and your tests are completely normal, you will receive your results only by: MyChart Message (if you have MyChart) OR A paper copy in the mail If you have any lab test that is abnormal or we need to change your treatment, we will call you to review the results.  Testing/Procedures: ECHOCARDIOGRAM  RENAL ARTERY DUPLEX  Follow-Up: At [facility], you and your health needs are our priority.  As part of our continuing mission to provide you with exceptional heart care, our providers are all part of one team.  This team includes your primary Cardiologist (physician) and Advanced Practice Providers or APPs (Physician Assistants and Nurse Practitioners) who all work together to provide you with the care you need, when you need it.  Your next appointment:   4-6 week(s)  Provider:   [facility] at [facility]   We recommend signing up for the patient portal called MyChart.  Sign up information is provided on this After Visit Summary.  MyChart is used to connect with patients for Virtual Visits (Telemedicine).  Patients are able to view lab/test results, encounter notes, upcoming appointments, etc.  Non-urgent messages can be sent to your provider as well.   To learn more about what you can do with MyChart, go to [email or web address].   Other Instructions Your physician has requested that you have an echocardiogram. Echocardiography is a painless test that uses sound waves to create images of your heart. It provides your doctor with information about the size and shape of your heart and how well your hearts chambers and  valves are working. This procedure takes approximately one hour. There are no restrictions for this procedure. Please do NOT wear cologne, perfume, aftershave, or lotions (deodorant is allowed). Please arrive 15 minutes prior to your appointment time.  Please note: We ask at that you not bring children with you during ultrasound (echo/ vascular) testing. Due to room size and safety concerns, children are not allowed in the ultrasound rooms during exams. Our front office staff cannot provide observation of children in our lobby area while testing is being conducted. An adult accompanying a patient to their appointment will only be allowed in the ultrasound room at the discretion of the ultrasound technician under special circumstances. We apologize for any inconvenience.   Your physician has requested that you have a renal artery duplex. During this test, an ultrasound is used to evaluate blood flow to the kidneys. Allow one hour for this exam. Do not eat after midnight the day before and avoid carbonated beverages. Take your medications as you usually do.  We are referring you to our Advanced Hypertension Clinic.

## 2024-09-18 NOTE — Telephone Encounter (Signed)
 Patient was able to get labs done right now because she has to pay out of pocket. Wanted to make dr aware. Please advise

## 2024-09-19 LAB — BASIC METABOLIC PANEL WITH GFR
BUN/Creatinine Ratio: 14 (ref 9–23)
BUN: 14 mg/dL (ref 6–24)
CO2: 21 mmol/L (ref 20–29)
Calcium: 9.7 mg/dL (ref 8.7–10.2)
Chloride: 103 mmol/L (ref 96–106)
Creatinine, Ser: 1 mg/dL (ref 0.57–1.00)
Glucose: 80 mg/dL (ref 70–99)
Potassium: 4.2 mmol/L (ref 3.5–5.2)
Sodium: 144 mmol/L (ref 134–144)
eGFR: 69 mL/min/{1.73_m2}

## 2024-09-19 LAB — SPECIMEN STATUS REPORT

## 2024-09-25 ENCOUNTER — Other Ambulatory Visit (HOSPITAL_COMMUNITY): Payer: Self-pay

## 2024-09-25 ENCOUNTER — Other Ambulatory Visit: Payer: Self-pay

## 2024-10-22 ENCOUNTER — Institutional Professional Consult (permissible substitution) (HOSPITAL_BASED_OUTPATIENT_CLINIC_OR_DEPARTMENT_OTHER): Payer: Self-pay | Admitting: Family

## 2024-10-28 ENCOUNTER — Ambulatory Visit (HOSPITAL_COMMUNITY): Payer: Self-pay

## 2024-11-03 ENCOUNTER — Ambulatory Visit (HOSPITAL_COMMUNITY): Payer: Self-pay
